# Patient Record
Sex: Male | Born: 1935 | Race: White | Hispanic: No | Marital: Married | Smoking: Never smoker
Health system: Southern US, Community
[De-identification: ages and names within clinical notes are randomized; demographics above are authoritative.]

## PROBLEM LIST (undated history)

## (undated) DIAGNOSIS — K5730 Diverticulosis of large intestine without perforation or abscess without bleeding: Secondary | ICD-10-CM

## (undated) DIAGNOSIS — F411 Generalized anxiety disorder: Secondary | ICD-10-CM

## (undated) DIAGNOSIS — C459 Mesothelioma, unspecified: Secondary | ICD-10-CM

## (undated) DIAGNOSIS — K6289 Other specified diseases of anus and rectum: Secondary | ICD-10-CM

## (undated) DIAGNOSIS — I10 Essential (primary) hypertension: Secondary | ICD-10-CM

## (undated) DIAGNOSIS — I2699 Other pulmonary embolism without acute cor pulmonale: Secondary | ICD-10-CM

## (undated) DIAGNOSIS — R972 Elevated prostate specific antigen [PSA]: Secondary | ICD-10-CM

## (undated) DIAGNOSIS — Z77090 Contact with and (suspected) exposure to asbestos: Secondary | ICD-10-CM

## (undated) DIAGNOSIS — E785 Hyperlipidemia, unspecified: Secondary | ICD-10-CM

## (undated) DIAGNOSIS — C801 Malignant (primary) neoplasm, unspecified: Secondary | ICD-10-CM

## (undated) DIAGNOSIS — K219 Gastro-esophageal reflux disease without esophagitis: Secondary | ICD-10-CM

## (undated) DIAGNOSIS — M1990 Unspecified osteoarthritis, unspecified site: Secondary | ICD-10-CM

## (undated) DIAGNOSIS — R0602 Shortness of breath: Secondary | ICD-10-CM

## (undated) HISTORY — DX: Contact with and (suspected) exposure to asbestos: Z77.090

## (undated) HISTORY — DX: Diverticulosis of large intestine without perforation or abscess without bleeding: K57.30

## (undated) HISTORY — DX: Essential (primary) hypertension: I10

## (undated) HISTORY — DX: Elevated prostate specific antigen (PSA): R97.20

## (undated) HISTORY — PX: COLONOSCOPY W/ POLYPECTOMY: SHX1380

## (undated) HISTORY — DX: Other specified diseases of anus and rectum: K62.89

## (undated) HISTORY — DX: Gastro-esophageal reflux disease without esophagitis: K21.9

## (undated) HISTORY — DX: Generalized anxiety disorder: F41.1

## (undated) HISTORY — DX: Hyperlipidemia, unspecified: E78.5

## (undated) HISTORY — DX: Unspecified osteoarthritis, unspecified site: M19.90

## (undated) HISTORY — DX: Other pulmonary embolism without acute cor pulmonale: I26.99

---

## 1987-05-03 HISTORY — PX: INGUINAL HERNIA REPAIR: SUR1180

## 1999-10-11 ENCOUNTER — Ambulatory Visit (HOSPITAL_BASED_OUTPATIENT_CLINIC_OR_DEPARTMENT_OTHER): Admission: RE | Admit: 1999-10-11 | Discharge: 1999-10-11 | Payer: Self-pay | Admitting: Urology

## 1999-10-16 ENCOUNTER — Emergency Department (HOSPITAL_COMMUNITY): Admission: EM | Admit: 1999-10-16 | Discharge: 1999-10-16 | Payer: Self-pay | Admitting: Emergency Medicine

## 2004-03-19 ENCOUNTER — Ambulatory Visit: Payer: Self-pay | Admitting: Pulmonary Disease

## 2004-06-16 ENCOUNTER — Ambulatory Visit: Payer: Self-pay | Admitting: Pulmonary Disease

## 2004-12-14 ENCOUNTER — Ambulatory Visit: Payer: Self-pay | Admitting: Pulmonary Disease

## 2005-06-20 ENCOUNTER — Ambulatory Visit: Payer: Self-pay | Admitting: Pulmonary Disease

## 2005-12-13 ENCOUNTER — Ambulatory Visit: Payer: Self-pay | Admitting: Pulmonary Disease

## 2006-04-13 ENCOUNTER — Ambulatory Visit: Payer: Self-pay | Admitting: Pulmonary Disease

## 2006-06-22 ENCOUNTER — Ambulatory Visit: Payer: Self-pay | Admitting: Pulmonary Disease

## 2006-06-22 LAB — CONVERTED CEMR LAB
ALT: 21 units/L (ref 0–40)
Alkaline Phosphatase: 50 units/L (ref 39–117)
BUN: 16 mg/dL (ref 6–23)
Bilirubin, Direct: 0.1 mg/dL (ref 0.0–0.3)
Calcium: 9.3 mg/dL (ref 8.4–10.5)
Cholesterol: 160 mg/dL (ref 0–200)
Eosinophils Absolute: 0.2 10*3/uL (ref 0.0–0.6)
Eosinophils Relative: 2.8 % (ref 0.0–5.0)
GFR calc Af Amer: 85 mL/min
GFR calc non Af Amer: 70 mL/min
HCT: 47.7 % (ref 39.0–52.0)
HDL: 37 mg/dL — ABNORMAL LOW (ref >39.0)
Hemoglobin, Urine: NEGATIVE
Hemoglobin: 16.3 g/dL (ref 13.0–17.0)
MCHC: 34.2 g/dL (ref 30.0–36.0)
MCV: 91.1 fL (ref 78.0–100.0)
Neutro Abs: 2.8 10*3/uL (ref 1.4–7.7)
Neutrophils Relative %: 46 % (ref 43.0–77.0)
Nitrite: NEGATIVE
RDW: 13.4 % (ref 11.5–14.6)
Specific Gravity, Urine: 1.01 (ref 1.000–1.03)
TSH: 2.59 microintl units/mL (ref 0.35–5.50)
Total CHOL/HDL Ratio: 4.3
Total Protein, Urine: NEGATIVE mg/dL
Urobilinogen, UA: 0.2 (ref 0.0–1.0)
pH: 6 (ref 5.0–8.0)

## 2006-12-13 ENCOUNTER — Ambulatory Visit: Payer: Self-pay | Admitting: Pulmonary Disease

## 2006-12-13 LAB — CONVERTED CEMR LAB
CO2: 33 meq/L — ABNORMAL HIGH (ref 19–32)
Chloride: 109 meq/L (ref 96–112)
Cholesterol: 143 mg/dL (ref 0–200)
GFR calc Af Amer: 107 mL/min
Glucose, Bld: 108 mg/dL — ABNORMAL HIGH (ref 70–99)
HDL: 31.8 mg/dL — ABNORMAL LOW (ref >39.0)
Sodium: 145 meq/L (ref 135–145)
Total Bilirubin: 0.8 mg/dL (ref 0.3–1.2)
Total CHOL/HDL Ratio: 4.5
Total Protein: 6.7 g/dL (ref 6.0–8.3)
Triglycerides: 147 mg/dL (ref 0–149)

## 2007-05-21 ENCOUNTER — Ambulatory Visit: Payer: Self-pay | Admitting: Pulmonary Disease

## 2007-06-04 DIAGNOSIS — I10 Essential (primary) hypertension: Secondary | ICD-10-CM | POA: Insufficient documentation

## 2007-06-04 DIAGNOSIS — K219 Gastro-esophageal reflux disease without esophagitis: Secondary | ICD-10-CM | POA: Insufficient documentation

## 2007-06-04 DIAGNOSIS — E785 Hyperlipidemia, unspecified: Secondary | ICD-10-CM | POA: Insufficient documentation

## 2007-06-18 ENCOUNTER — Telehealth: Payer: Self-pay | Admitting: Pulmonary Disease

## 2007-06-19 ENCOUNTER — Ambulatory Visit: Payer: Self-pay | Admitting: Pulmonary Disease

## 2007-06-19 LAB — CONVERTED CEMR LAB
ALT: 26 units/L (ref 0–53)
AST: 24 units/L (ref 0–37)
Bacteria, UA: NEGATIVE
Basophils Absolute: 0 10*3/uL (ref 0.0–0.1)
Bilirubin Urine: NEGATIVE
Bilirubin, Direct: 0.1 mg/dL (ref 0.0–0.3)
Cholesterol: 165 mg/dL (ref 0–200)
Crystals: NEGATIVE
Eosinophils Relative: 5.4 % — ABNORMAL HIGH (ref 0.0–5.0)
GFR calc Af Amer: 77 mL/min
Glucose, Bld: 109 mg/dL — ABNORMAL HIGH (ref 70–99)
HDL: 30.9 mg/dL — ABNORMAL LOW (ref >39.0)
Hemoglobin, Urine: NEGATIVE
Hemoglobin: 15.9 g/dL (ref 13.0–17.0)
MCHC: 33 g/dL (ref 30.0–36.0)
Monocytes Absolute: 0.5 10*3/uL (ref 0.2–0.7)
Monocytes Relative: 7.3 % (ref 3.0–11.0)
Mucus, UA: NEGATIVE
Neutro Abs: 3 10*3/uL (ref 1.4–7.7)
Nitrite: NEGATIVE
Platelets: 164 10*3/uL (ref 150–400)
Sodium: 141 meq/L (ref 135–145)
Squamous Epithelial / LPF: NEGATIVE /lpf
TSH: 5.57 microintl units/mL — ABNORMAL HIGH (ref 0.35–5.50)
Total Bilirubin: 0.6 mg/dL (ref 0.3–1.2)
Total CHOL/HDL Ratio: 5.3
Triglycerides: 190 mg/dL — ABNORMAL HIGH (ref 0–149)
Urine Glucose: NEGATIVE mg/dL
Urobilinogen, UA: 0.2 (ref 0.0–1.0)
WBC: 6.7 10*3/uL (ref 4.5–10.5)
pH: 6.5 (ref 5.0–8.0)

## 2007-06-26 ENCOUNTER — Ambulatory Visit: Payer: Self-pay | Admitting: Pulmonary Disease

## 2007-06-26 DIAGNOSIS — F411 Generalized anxiety disorder: Secondary | ICD-10-CM | POA: Insufficient documentation

## 2007-06-26 DIAGNOSIS — Z77090 Contact with and (suspected) exposure to asbestos: Secondary | ICD-10-CM

## 2007-06-26 DIAGNOSIS — M1990 Unspecified osteoarthritis, unspecified site: Secondary | ICD-10-CM | POA: Insufficient documentation

## 2007-06-26 DIAGNOSIS — K5730 Diverticulosis of large intestine without perforation or abscess without bleeding: Secondary | ICD-10-CM | POA: Insufficient documentation

## 2007-06-26 DIAGNOSIS — R972 Elevated prostate specific antigen [PSA]: Secondary | ICD-10-CM

## 2007-07-26 ENCOUNTER — Encounter: Payer: Self-pay | Admitting: Pulmonary Disease

## 2007-08-09 ENCOUNTER — Encounter: Payer: Self-pay | Admitting: Pulmonary Disease

## 2007-09-03 ENCOUNTER — Telehealth (INDEPENDENT_AMBULATORY_CARE_PROVIDER_SITE_OTHER): Payer: Self-pay | Admitting: *Deleted

## 2007-09-04 ENCOUNTER — Ambulatory Visit: Payer: Self-pay | Admitting: Pulmonary Disease

## 2007-09-05 DIAGNOSIS — R109 Unspecified abdominal pain: Secondary | ICD-10-CM | POA: Insufficient documentation

## 2007-09-21 ENCOUNTER — Ambulatory Visit: Payer: Self-pay | Admitting: Gastroenterology

## 2007-10-09 ENCOUNTER — Telehealth: Payer: Self-pay | Admitting: Gastroenterology

## 2007-10-10 ENCOUNTER — Ambulatory Visit: Payer: Self-pay | Admitting: Gastroenterology

## 2007-10-11 ENCOUNTER — Telehealth: Payer: Self-pay | Admitting: Gastroenterology

## 2007-10-11 DIAGNOSIS — R198 Other specified symptoms and signs involving the digestive system and abdomen: Secondary | ICD-10-CM

## 2007-10-25 ENCOUNTER — Encounter: Payer: Self-pay | Admitting: Pulmonary Disease

## 2007-10-31 ENCOUNTER — Ambulatory Visit: Payer: Self-pay | Admitting: Gastroenterology

## 2007-11-15 ENCOUNTER — Ambulatory Visit: Payer: Self-pay | Admitting: Family Medicine

## 2007-11-15 DIAGNOSIS — L821 Other seborrheic keratosis: Secondary | ICD-10-CM | POA: Insufficient documentation

## 2007-12-04 ENCOUNTER — Telehealth (INDEPENDENT_AMBULATORY_CARE_PROVIDER_SITE_OTHER): Payer: Self-pay | Admitting: *Deleted

## 2008-06-30 ENCOUNTER — Ambulatory Visit: Payer: Self-pay | Admitting: Family Medicine

## 2008-07-02 LAB — CONVERTED CEMR LAB
AST: 22 units/L (ref 0–37)
BUN: 16 mg/dL (ref 6–23)
Basophils Absolute: 0 10*3/uL (ref 0.0–0.1)
Basophils Relative: 0.1 % (ref 0.0–3.0)
CO2: 30 meq/L (ref 19–32)
Chloride: 104 meq/L (ref 96–112)
Eosinophils Absolute: 0.2 10*3/uL (ref 0.0–0.7)
HCT: 46.9 % (ref 39.0–52.0)
Lymphocytes Relative: 40.5 % (ref 12.0–46.0)
MCHC: 34.7 g/dL (ref 30.0–36.0)
MCV: 93.4 fL (ref 78.0–100.0)
Neutrophils Relative %: 49.3 % (ref 43.0–77.0)
RBC: 5.02 M/uL (ref 4.22–5.81)
RDW: 13.4 % (ref 11.5–14.6)
Total Bilirubin: 0.8 mg/dL (ref 0.3–1.2)
Triglycerides: 180 mg/dL — ABNORMAL HIGH (ref 0–149)
VLDL: 36 mg/dL (ref 0–40)

## 2008-08-11 ENCOUNTER — Telehealth: Payer: Self-pay | Admitting: Family Medicine

## 2009-05-05 ENCOUNTER — Encounter (INDEPENDENT_AMBULATORY_CARE_PROVIDER_SITE_OTHER): Payer: Self-pay | Admitting: *Deleted

## 2009-05-19 ENCOUNTER — Ambulatory Visit: Payer: Self-pay | Admitting: Gastroenterology

## 2009-05-19 DIAGNOSIS — K6289 Other specified diseases of anus and rectum: Secondary | ICD-10-CM

## 2009-08-03 ENCOUNTER — Ambulatory Visit: Payer: Self-pay | Admitting: Family Medicine

## 2009-08-03 ENCOUNTER — Telehealth (INDEPENDENT_AMBULATORY_CARE_PROVIDER_SITE_OTHER): Payer: Self-pay | Admitting: *Deleted

## 2009-08-03 DIAGNOSIS — G609 Hereditary and idiopathic neuropathy, unspecified: Secondary | ICD-10-CM

## 2009-08-10 ENCOUNTER — Ambulatory Visit: Payer: Self-pay | Admitting: Family Medicine

## 2009-08-11 DIAGNOSIS — L820 Inflamed seborrheic keratosis: Secondary | ICD-10-CM

## 2009-09-11 ENCOUNTER — Ambulatory Visit: Payer: Self-pay | Admitting: Family Medicine

## 2009-09-11 LAB — CONVERTED CEMR LAB
Bilirubin Urine: NEGATIVE
Glucose, Urine, Semiquant: NEGATIVE
Urobilinogen, UA: 0.2
pH: 6

## 2010-05-30 LAB — CONVERTED CEMR LAB
AST: 24 units/L (ref 0–37)
Alkaline Phosphatase: 51 units/L (ref 39–117)
Basophils Absolute: 0 10*3/uL (ref 0.0–0.1)
Basophils Relative: 0.3 % (ref 0.0–3.0)
Chloride: 105 meq/L (ref 96–112)
Cholesterol: 173 mg/dL (ref 0–200)
Direct LDL: 93.3 mg/dL
GFR calc non Af Amer: 69.67 mL/min (ref >60)
Glucose, Bld: 95 mg/dL (ref 70–99)
Hemoglobin: 17 g/dL (ref 13.0–17.0)
Iron: 77 ug/dL (ref 42–165)
MCHC: 34.8 g/dL (ref 30.0–36.0)
MCV: 94.3 fL (ref 78.0–100.0)
PSA: 14.37 ng/mL — ABNORMAL HIGH (ref 0.10–4.00)
Saturation Ratios: 23.5 % (ref 20.0–50.0)
Total Bilirubin: 0.5 mg/dL (ref 0.3–1.2)
Total CHOL/HDL Ratio: 4
Triglycerides: 205 mg/dL — ABNORMAL HIGH (ref 0.0–149.0)
VLDL: 41 mg/dL — ABNORMAL HIGH (ref 0.0–40.0)
Vitamin B-12: 369 pg/mL (ref 211–911)
WBC: 6.4 10*3/uL (ref 4.5–10.5)

## 2010-06-03 NOTE — Assessment & Plan Note (Signed)
Summary: RECTAL SORENESS...EM   History of Present Illness Visit Type: Follow-up Visit Primary GI MD: Sheryn Bison MD FACP FAGA Primary Provider: Fredia Sorrow, MD Chief Complaint: rectal soreness after bowel movements History of Present Illness:   This patient is a 77 year old white male he had an removal of a hyperplastic hypertrophied anal papilla at [facility] approximately a year ago. Colonoscopy otherwise that time was unremarkable. About the same time he had prostate biopsy by Dr. Shiela Mayer and has subsequently had persistent discomfort around his anal rectal area. He denies bowel regularity and takes daily Citrucel. He's had no rectal bleeding or abdominal pain. Local steroid creams to his rectum have not been successful in alleviating his discomfort which is worse with a bowel movement. He does have chronic diverticulosis, hypertension, anxiety syndrome, and BPH.   GI Review of Systems    Reports acid reflux and  bloating.      Denies abdominal pain, belching, chest pain, dysphagia with liquids, dysphagia with solids, heartburn, loss of appetite, nausea, vomiting, vomiting blood, weight loss, and  weight gain.      Reports diverticulosis, hemorrhoids, and  rectal pain.     Denies anal fissure, black tarry stools, change in bowel habit, constipation, diarrhea, fecal incontinence, heme positive stool, irritable bowel syndrome, jaundice, light color stool, liver problems, and  rectal bleeding.    Current Medications (verified): 1)  Tenormin 50 Mg Tabs (Atenolol) .... Take 1 Tablet By Mouth Once A Day 2)  Terazosin Hcl 10 Mg Caps (Terazosin Hcl) .... Once Daily 3)  Pravachol 40 Mg Tabs (Pravastatin Sodium) .... Take 1 Tablet By Mouth At Bedtime 4)  Nexium 40 Mg Cpdr (Esomeprazole Magnesium) .... Take 1 Capsule By Mouth Once A Day 5)  Multivitamins   Tabs (Multiple Vitamin) .Marland Kitchen.. 1 Tab Daily.Marland KitchenMarland Kitchen 6)  Epipen 0.3 Mg/0.67ml (1:1000)  Devi (Epinephrine Hcl (Anaphylaxis)) .... Use As  Directed 7)  Fish Oil   Oil (Fish Oil) .... 2 Tabs Once Daily 8)  Glucosamine-Chondroitin 250-200 Mg  Caps (Glucosamine-Chondroitin) .... Once Daily 9)  Adult Aspirin Low Strength 81 Mg  Tbdp (Aspirin) .... Once Daily  Allergies (verified): 1)  ! * Bee Stings  Past History:  Past medical, surgical, family and social histories (including risk factors) reviewed for relevance to current acute and chronic problems.  Past Medical History: Reviewed history from 09/04/2007 and no changes required. HISTORY OF ASBESTOS EXPOSURE (ICD-V15.84) HYPERTENSION (ICD-401.9) HYPERLIPIDEMIA (ICD-272.4) GERD (ICD-530.81) DIVERTICULOSIS OF COLON (ICD-562.10) ELEVATED PROSTATE SPECIFIC ANTIGEN (ICD-790.93) DEGENERATIVE JOINT DISEASE (ICD-715.90) ANXIETY (ICD-300.00)    Past Surgical History: Reviewed history from 06/26/2007 and no changes required. S/P left inguinal hernia repair  Family History: Reviewed history from 11/15/2007 and no changes required.  father died in his 12s of coronary disease and underlying colon cancer mother died of coronary diseaseone brother had  a heart attack two in good health.  One sister in good health  Social History: Reviewed history from 11/15/2007 and no changes required. Retired Never Smoked Alcohol use-no Drug use-no Regular exercise-yes  Review of Systems  The patient denies allergy/sinus, anemia, anxiety-new, arthritis/joint pain, back pain, blood in urine, breast changes/lumps, change in vision, confusion, cough, coughing up blood, depression-new, fainting, fatigue, fever, headaches-new, hearing problems, heart murmur, heart rhythm changes, itching, menstrual pain, muscle pains/cramps, night sweats, nosebleeds, pregnancy symptoms, shortness of breath, skin rash, sleeping problems, sore throat, swelling of feet/legs, swollen lymph glands, thirst - excessive , urination - excessive , urination changes/pain, urine leakage, vision changes, and voice change.  Vital Signs:  Patient profile:   77 year old male Height:      68 inches Weight:      172.13 pounds BMI:     26.27 Pulse rate:   64 / minute Pulse rhythm:   regular BP sitting:   140 / 76  (left arm) Cuff size:   regular  Vitals Entered By: June McMurray CMA Duncan Dull) (May 19, 2009 2:36 PM)  Physical Exam  General:  Well developed, well nourished, no acute distress.healthy appearing.   Head:  Normocephalic and atraumatic. Eyes:  PERRLA, no icterus.exam deferred to patient's ophthalmologist.   Abdomen:  Soft, nontender and nondistended. No masses, hepatosplenomegaly or hernias noted. Normal bowel sounds. Rectal:  inspection of his rectum shows slight anal irritation laterally on the left side without a definite fissure or palpable skin tag.I cannot see any hemorrhoids or fistula. Rectal exam otherwise is unremarkable stool is guaiac-negative. Prostate:  Prostate is diffusely enlarged and slightly nodular. Psych:  Alert and cooperative. Normal mood and affect.   Impression & Recommendations:  Problem # 1:  ANAL OR RECTAL PAIN (ZHY-865.78) Assessment Unchanged His rectal exam is really fairly unremarkable. I think he has some local atrophy of his perianal area related to his surgery and use of steroid creams. He does have a history of seborrhea and this may be a localized form of rectal seborrheic irritation. There is no evidence of recurrent polyps,fissures or fistulae. This patient has a history of a very low pain threshold and chronic IBS-type complaints. Have asked him to stop local steroid creams and to use Balneol cleansing twice a day to his rectum followed by zinc oxide paste and to continue daily fiber supplement.  Problem # 2:  ELEVATED PROSTATE SPECIFIC ANTIGEN (ICD-790.93) Assessment: Improved continue high-fiber diet as tolerated  Problem # 3:  ELEVATED PROSTATE SPECIFIC ANTIGEN (ICD-790.93) Assessment: Unchanged Followup with urology as scheduled  Patient  Instructions: 1)  Copy sent to : Dr. Kelle Darting 2)  Please continue current medications. 3)  stop steroid creams and use zinc oxide paste and rectal cleansing solution b.i.d. 4)  Continue high-fiber diet and fiber supplements   Appended Document: RECTAL SORENESS.Marland KitchenMarland KitchenEM    Clinical Lists Changes  Medications: Added new medication of * BALANOL SOLUTION Use BID

## 2010-06-03 NOTE — Assessment & Plan Note (Signed)
Summary: mole removal/treatment room/cjr   Procedure Note  Mole Biopsy/Removal: Indication: suspicious lesion Consent signed: yes  Procedure # 1: elliptical incision with 2 mm margin    Size (in cm): 1.2 x 1.2    Region: dorsal    Location: shoulder R    Instrument used: #15 blade    Anesthesia: 1% lidocaine w/epinephrine    Closure: cautery  Cleaned and prepped with: alcohol Wound dressing: neosporin and bandaid   Primary Care Provider:  Fredia Sorrow, MD   History of Present Illness: Sean Mitchell is a 77 year old, married male, nonsmoker, who comes in today for removal of lesion under his right shoulder that is red and inflamed.  Allergies: 1)  ! * Bee Stings   Complete Medication List: 1)  Tenormin 50 Mg Tabs (Atenolol) .... Take 1 tablet by mouth once a day 2)  Terazosin Hcl 10 Mg Caps (Terazosin hcl) .... Once daily 3)  Pravachol 40 Mg Tabs (Pravastatin sodium) .... Take 1 tablet by mouth at bedtime 4)  Nexium 40 Mg Cpdr (Esomeprazole magnesium) .... Take 1 capsule by mouth once a day 5)  Multivitamins Tabs (Multiple vitamin) .Marland Kitchen.. 1 tab daily.Marland KitchenMarland Kitchen 6)  Epipen 0.3 Mg/0.17ml (1:1000) Devi (Epinephrine hcl (anaphylaxis)) .... Use as directed 7)  Fish Oil Oil (Fish oil) .... 2 tabs once daily 8)  Glucosamine-chondroitin 250-200 Mg Caps (Glucosamine-chondroitin) .... Once daily 9)  Adult Aspirin Low Strength 81 Mg Tbdp (Aspirin) .... Once daily 10)  Balanol Solution  .... Use bid  Other Orders: Excise other (benign) lesion (FEENLM), 0.6 - 1.0 cm (11441)

## 2010-06-03 NOTE — Miscellaneous (Signed)
Summary: Consent for Mole Removal  Consent for Mole Removal   Imported By: Maryln Gottron 08/13/2009 10:07:05  _____________________________________________________________________  External Attachment:    Type:   Image     Comment:   External Document

## 2010-06-03 NOTE — Progress Notes (Signed)
  Faxed lab results from 08-03-09 to Dr. Vonita Moss at fax# [phone].

## 2010-06-03 NOTE — Assessment & Plan Note (Signed)
Summary: emp/pt coming in fasting/cjr   Vital Signs:  Patient profile:   77 year old male Height:      68 inches Weight:      171 pounds Temp:     97.6 degrees F oral BP sitting:   124 / 80  (left arm) Cuff size:   regular  Vitals Entered By: Kern Reap CMA Duncan Dull) (August 03, 2009 9:34 AM) CC: cpx Is Patient Diabetic? No Pain Assessment Patient in pain? no        Primary Care Provider:  Fredia Sorrow, MD  CC:  cpx.  History of Present Illness: Sean Mitchell is a 77 year old, married male, nonsmoker, who comes in today for evaluation of reflux esophagitis hyperlipidemia, mild hypertension, and history of bee sting allergy and BPH.  His reflux esophagitis is treated with Nexium 40 mg daily asymptomatic on medication.  Hyperlipidemia.  History with Pravachol 40 mg nightly.  We will check lipid panel today.  His hypertension was treated with Tenormin 50 mg daily.  BP 124/80.  We always given the new EpiPen because of his history of bee sting allergy.  He also takes terazoin 10 mg daily for BPH.  He's had elevated prostate increased PSA, but negative biopsies.  He also has a new problem.  He complains of some tingling in his feet.  Review of systems negative.  He gets routine eye care.  Dental care.  Colonoscopy 2008 normal, tetanus booster 2005, Pneumovax 2010, seasonal flu 2010, shingles 2009  his past medical history, social history, family history reviewed in detail the been no significant changes.  Except for the neuropathy as noted above.  He continues to be physically active.  His weight is good at 171.  Mood is good.  Hearing normal ADLs, normal Ris fall.  Minimal home safety reviewed negative.  Height, weight, vision fine.  Allergies: 1)  ! * Bee Stings  Past History:  Past medical, surgical, family and social histories (including risk factors) reviewed, and no changes noted (except as noted below).  Past Medical History: Reviewed history from 09/04/2007 and no  changes required. HISTORY OF ASBESTOS EXPOSURE (ICD-V15.84) HYPERTENSION (ICD-401.9) HYPERLIPIDEMIA (ICD-272.4) GERD (ICD-530.81) DIVERTICULOSIS OF COLON (ICD-562.10) ELEVATED PROSTATE SPECIFIC ANTIGEN (ICD-790.93) DEGENERATIVE JOINT DISEASE (ICD-715.90) ANXIETY (ICD-300.00)    Past Surgical History: Reviewed history from 06/26/2007 and no changes required. S/P left inguinal hernia repair  Family History: Reviewed history from 11/15/2007 and no changes required.  father died in his 79s of coronary disease and underlying colon cancer mother died of coronary diseaseone brother had  a heart attack two in good health.  One sister in good health  Social History: Reviewed history from 11/15/2007 and no changes required. Retired Never Smoked Alcohol use-no Drug use-no Regular exercise-yes  Review of Systems      See HPI  Physical Exam  General:  Well-developed,well-nourished,in no acute distress; alert,appropriate and cooperative throughout examination Head:  Normocephalic and atraumatic without obvious abnormalities. No apparent alopecia or balding. Eyes:  No corneal or conjunctival inflammation noted. EOMI. Perrla. Funduscopic exam benign, without hemorrhages, exudates or papilledema. Vision grossly normal. Ears:  External ear exam shows no significant lesions or deformities.  Otoscopic examination reveals clear canals, tympanic membranes are intact bilaterally without bulging, retraction, inflammation or discharge. Hearing is grossly normal bilaterally. Nose:  External nasal examination shows no deformity or inflammation. Nasal mucosa are pink and moist without lesions or exudates. Mouth:  Oral mucosa and oropharynx without lesions or exudates.  Teeth in good repair. Neck:  No deformities, masses, or tenderness noted. Chest Wall:  No deformities, masses, tenderness or gynecomastia noted. Breasts:  No masses or gynecomastia noted Lungs:  Normal respiratory effort, chest expands  symmetrically. Lungs are clear to auscultation, no crackles or wheezes. Heart:  Normal rate and regular rhythm. S1 and S2 normal without gallop, murmur, click, rub or other extra sounds. Abdomen:  Bowel sounds positive,abdomen soft and non-tender without masses, organomegaly or hernias noted. Msk:  No deformity or scoliosis noted of thoracic or lumbar spine.   Pulses:  R and L carotid,radial,femoral,dorsalis pedis and posterior tibial pulses are full and equal bilaterally Extremities:  No clubbing, cyanosis, edema, or deformity noted with normal full range of motion of all joints.   Neurologic:  No cranial nerve deficits noted. Station and gait are normal. Plantar reflexes are down-going bilaterally. DTRs are symmetrical throughout. Sensory, motor and coordinative functions appear intact. Skin:  Intact without suspicious lesions or rashes Cervical Nodes:  No lymphadenopathy noted Axillary Nodes:  No palpable lymphadenopathy Inguinal Nodes:  No significant adenopathy Psych:  Cognition and judgment appear intact. Alert and cooperative with normal attention span and concentration. No apparent delusions, illusions, hallucinations   Impression & Recommendations:  Problem # 1:  HISTORY OF ASBESTOS EXPOSURE (ICD-V15.84) Assessment Unchanged  Problem # 2:  HYPERTENSION (ICD-401.9) Assessment: Improved  His updated medication list for this problem includes:    Tenormin 50 Mg Tabs (Atenolol) .Marland Kitchen... Take 1 tablet by mouth once a day    Terazosin Hcl 10 Mg Caps (Terazosin hcl) ..... Once daily  Orders: Venipuncture (95621) TLB-Lipid Panel (80061-LIPID) TLB-CBC Platelet - w/Differential (85025-CBCD) TLB-Hepatic/Liver Function Pnl (80076-HEPATIC) TLB-TSH (Thyroid Stimulating Hormone) (84443-TSH) TLB-BMP (Basic Metabolic Panel-BMET) (80048-METABOL) TLB-B12 + Folate Pnl (30865_78469-G29/BMW) TLB-IBC Pnl (Iron/FE;Transferrin) (83550-IBC) TLB-PSA (Prostate Specific Antigen) (84153-PSA) TLB-A1C / Hgb  A1C (Glycohemoglobin) (83036-A1C) Prescription Created Electronically [phone]) EKG w/ Interpretation (93000)  Problem # 3:  GERD (ICD-530.81) Assessment: Improved  His updated medication list for this problem includes:    Nexium 40 Mg Cpdr (Esomeprazole magnesium) .Marland Kitchen... Take 1 capsule by mouth once a day  Orders: Venipuncture (40102) TLB-Lipid Panel (80061-LIPID) TLB-CBC Platelet - w/Differential (85025-CBCD) TLB-Hepatic/Liver Function Pnl (80076-HEPATIC) TLB-TSH (Thyroid Stimulating Hormone) (84443-TSH) TLB-BMP (Basic Metabolic Panel-BMET) (80048-METABOL) TLB-B12 + Folate Pnl ([id number]-K74/QVZ) TLB-IBC Pnl (Iron/FE;Transferrin) (83550-IBC) TLB-PSA (Prostate Specific Antigen) (84153-PSA) TLB-A1C / Hgb A1C (Glycohemoglobin) (83036-A1C) Prescription Created Electronically [phone])  Problem # 4:  ELEVATED PROSTATE SPECIFIC ANTIGEN (ICD-790.93) Assessment: Unchanged  Problem # 5:  PERIPHERAL NEUROPATHY (ICD-356.9) Assessment: New  Orders: Venipuncture (56433) TLB-Lipid Panel (80061-LIPID) TLB-CBC Platelet - w/Differential (85025-CBCD) TLB-Hepatic/Liver Function Pnl (80076-HEPATIC) TLB-TSH (Thyroid Stimulating Hormone) (84443-TSH) TLB-BMP (Basic Metabolic Panel-BMET) (80048-METABOL) TLB-B12 + Folate Pnl ([id number]-A63/KZS) TLB-IBC Pnl (Iron/FE;Transferrin) (83550-IBC) TLB-PSA (Prostate Specific Antigen) (84153-PSA) TLB-A1C / Hgb A1C (Glycohemoglobin) (83036-A1C) Prescription Created Electronically [phone])  Complete Medication List: 1)  Tenormin 50 Mg Tabs (Atenolol) .... Take 1 tablet by mouth once a day 2)  Terazosin Hcl 10 Mg Caps (Terazosin hcl) .... Once daily 3)  Pravachol 40 Mg Tabs (Pravastatin sodium) .... Take 1 tablet by mouth at bedtime 4)  Nexium 40 Mg Cpdr (Esomeprazole magnesium) .... Take 1 capsule by mouth once a day 5)  Multivitamins Tabs (Multiple vitamin) .Marland Kitchen.. 1 tab daily.Marland KitchenMarland Kitchen 6)  Epipen 0.3 Mg/0.59ml (1:1000) Devi (Epinephrine hcl (anaphylaxis)) .... Use  as directed 7)  Fish Oil Oil (Fish oil) .... 2 tabs once daily 8)  Glucosamine-chondroitin 250-200 Mg Caps (Glucosamine-chondroitin) .... Once daily 9)  Adult Aspirin Low Strength 81 Mg Tbdp (Aspirin) .Marland KitchenMarland KitchenMarland Kitchen  Once daily 10)  Balanol Solution  .... Use bid  Other Orders: T-2 View CXR (71020TC)  Patient Instructions: 1)  continue current medication.  We will call you with the report of your lab work.  You might want to consider taking the Nexium, Monday, Wednesday, Friday, instead of daily.  You can also take the Pravachol, Monday, Wednesday, Friday instead of daily also. 2)  Please schedule a follow-up appointment in 1 year. 3)  It is important that you exercise regularly at least 20 minutes 5 times a week. If you develop chest pain, have severe difficulty breathing, or feel very tired , stop exercising immediately and seek medical attention. 4)  Schedule a colonoscopy/sigmoidoscopy to help detect colon cancer. 5)  Take an Aspirin every day. Prescriptions: EPIPEN 0.3 MG/0.3ML (1:1000)  DEVI (EPINEPHRINE HCL (ANAPHYLAXIS)) use as directed  #1 x 1   Entered and Authorized by:   Roderick Pee MD   Signed by:   Roderick Pee MD on 08/03/2009   Method used:   Electronically to        [facility]  [phone]* (retail)       [address]       [place], [place]  [address]       Ph: [phone] or [phone]       Fax: [phone]   RxID:   [id number] NEXIUM 40 MG CPDR (ESOMEPRAZOLE MAGNESIUM) Take 1 capsule by mouth once a day  #100 x 3   Entered and Authorized by:   Roderick Pee MD   Signed by:   Roderick Pee MD on 08/03/2009   Method used:   Electronically to        [facility]  [phone]* (retail)       [address]       [place], [place]  [address]       Ph: [phone] or [phone]       Fax: [phone]   RxID:   [id number] PRAVACHOL 40 MG TABS (PRAVASTATIN SODIUM) Take 1 tablet by mouth at  bedtime  #100 x 3   Entered and Authorized by:   Roderick Pee MD   Signed by:   Roderick Pee MD on 08/03/2009   Method used:   Electronically to        [facility]  [phone]* (retail)       [address]       [place], [place]  [address]       Ph: [phone] or [phone]       Fax: [phone]   RxID:   [id number] TERAZOSIN HCL 10 MG CAPS (TERAZOSIN HCL) once daily  #100 x 3   Entered and Authorized by:   Roderick Pee MD   Signed by:   Roderick Pee MD on 08/03/2009   Method used:   Electronically to        [facility]  [phone]* (retail)       [address]       [place], [place]  [address]       Ph: [phone] or [phone]       Fax: [phone]   RxID:   [id number] TENORMIN 50 MG TABS (ATENOLOL) Take 1 tablet by mouth once a day  #100  x 3   Entered and Authorized by:   Roderick Pee MD   Signed by:   Roderick Pee MD on 08/03/2009   Method used:   Electronically to        [facility]  [phone]* (retail)       [address]       [place], [place]  [address]       Ph: [phone] or [phone]       Fax: [phone]   RxID:   [id number]    Immunization History:  Influenza Immunization History:    Influenza:  historical (01/30/2009)

## 2010-06-03 NOTE — Letter (Signed)
Summary: New Patient letter  [facility]  [address] [place], [place] [address]   Phone: [phone]  Fax: [phone]       05/05/2009 MRN: [id number]  Sean Mitchell [address] [place], [place]  [address]  Dear Sean Mitchell,  Welcome to the Gastroenterology Division at [facility].    You are scheduled to see Dr. Sheryn Bison on May 19, 2009 at 2:45pm on the 3rd floor at [facility], [address]  We ask that you try to arrive at our office 15 minutes prior to your appointment time to allow for check-in.  We would like you to complete the enclosed self-administered evaluation form prior to your visit and bring it with you on the day of your appointment.  We will review it with you.  Also, please bring a complete list of all your medications or, if you prefer, bring the medication bottles and we will list them.  Please bring your insurance card so that we may make a copy of it.  If your insurance requires a referral to see a specialist, please bring your referral form from your primary care physician.  Co-payments are due at the time of your visit and may be paid by cash, check or credit card.     Your office visit will consist of a consult with your physician (includes a physical exam), any laboratory testing he/she may order, scheduling of any necessary diagnostic testing (e.g. x-ray, ultrasound, CT-scan), and scheduling of a procedure (e.g. Endoscopy, Colonoscopy) if required.  Please allow enough time on your schedule to allow for any/all of these possibilities.    If you cannot keep your appointment, please call [phone] to cancel or reschedule prior to your appointment date.  This allows [place] the opportunity to schedule an appointment for another patient in need of care.  If you do not cancel or reschedule by 5 p.m. the business day prior to your appointment date, you will be charged a $50.00 late cancellation/no-show fee.    Thank you  for choosing [facility] for your medical needs.  We appreciate the opportunity to care for you.  Please visit [place] at our website  to learn more about our practice.                     Sincerely,                                                             The Gastroenterology Division

## 2010-06-03 NOTE — Assessment & Plan Note (Signed)
Summary: L SIDE / FLANK / BACK PAIN // RS   Vital Signs:  Patient profile:   77 year old male Weight:      170 pounds Temp:     97.6 degrees F oral BP sitting:   130 / 80  (right arm) CC: L side/flank back pain   Primary Care Provider:  Martin Belling, MD  CC:  L side/flank back pain.  History of Present Illness: Sean Mitchell is a 77 year old male, who comes in today for evaluation of left lower back pain x 4 days.  His wife has been in the, hospital.  He's been sitting a lot, and 4 days ago, developed the gradual onset of left low back pain.  He describes the discomfort as a dull.  It comes and goes it's a 5 on a scale of one to 10.  If he stands and stretches.  The pain feels better.  Review of systems negative.  No history of trauma.  No bowel nor bladder dysfunction  Allergies: 1)  ! * Bee Stings  Past History:  Past medical, surgical, family and social histories (including risk factors) reviewed for relevance to current acute and chronic problems.  Past Medical History: Reviewed history from 09/04/2007 and no changes required. HISTORY OF ASBESTOS EXPOSURE (ICD-V15.84) HYPERTENSION (ICD-401.9) HYPERLIPIDEMIA (ICD-272.4) GERD (ICD-530.81) DIVERTICULOSIS OF COLON (ICD-562.10) ELEVATED PROSTATE SPECIFIC ANTIGEN (ICD-790.93) DEGENERATIVE JOINT DISEASE (ICD-715.90) ANXIETY (ICD-300.00)    Past Surgical History: Reviewed history from 06/26/2007 and no changes required. S/P left inguinal hernia repair  Family History: Reviewed history from 11/15/2007 and no changes required.  father died in his 57s of coronary disease and underlying colon cancer mother died of coronary diseaseone brother had  a heart attack two in good health.  One sister in good health  Social History: Reviewed history from 11/15/2007 and no changes required. Retired Never Smoked Alcohol use-no Drug use-no Regular exercise-yes  Review of Systems      See HPI  Physical Exam  General:   Well-developed,well-nourished,in no acute distress; alert,appropriate and cooperative throughout examination Msk:  No deformity or scoliosis noted of thoracic or lumbar spine.   Pulses:  R and L carotid,radial,femoral,dorsalis pedis and posterior tibial pulses are full and equal bilaterally Extremities:  No clubbing, cyanosis, edema, or deformity noted with normal full range of motion of all joints.   Neurologic:  No cranial nerve deficits noted. Station and gait are normal. Plantar reflexes are down-going bilaterally. DTRs are symmetrical throughout. Sensory, motor and coordinative functions appear intact.   Impression & Recommendations:  Problem # 1:  FLANK PAIN, LEFT (ICD-789.09) Assessment New  His updated medication list for this problem includes:    Adult Aspirin Low Strength 81 Mg Tbdp (Aspirin) ..... Once daily  Orders: UA Dipstick w/o Micro (manual) (16109)  Complete Medication List: 1)  Tenormin 50 Mg Tabs (Atenolol) .... Take 1 tablet by mouth once a day 2)  Terazosin Hcl 10 Mg Caps (Terazosin hcl) .... Once daily 3)  Pravachol 40 Mg Tabs (Pravastatin sodium) .... Take 1 tablet by mouth at bedtime 4)  Nexium 40 Mg Cpdr (Esomeprazole magnesium) .... Take 1 capsule by mouth once a day 5)  Multivitamins Tabs (Multiple vitamin) .Marland Kitchen.. 1 tab daily.Marland KitchenMarland Kitchen 6)  Epipen 0.3 Mg/0.10ml (1:1000) Devi (Epinephrine hcl (anaphylaxis)) .... Use as directed 7)  Fish Oil Oil (Fish oil) .... 2 tabs once daily 8)  Glucosamine-chondroitin 250-200 Mg Caps (Glucosamine-chondroitin) .... Once daily 9)  Adult Aspirin Low Strength 81 Mg Tbdp (Aspirin) .... Once daily  10)  Balanol Solution  .... Use bid  Patient Instructions: 1)  take 600 mg of Motrin twice a day with food.  Also at bedtime use a heating pad remember to use low heat and cover the heating pad with a towel so u do not burn y skin     Laboratory Results   Urine Tests  Date/Time Received: Sep 11, 2009   Routine Urinalysis   Color:  yellow Appearance: Clear Glucose: negative   (Normal Range: Negative) Bilirubin: negative   (Normal Range: Negative) Ketone: negative   (Normal Range: Negative) Spec. Gravity: <1.005   (Normal Range: 1.003-1.035) Blood: negative   (Normal Range: Negative) pH: 6.0   (Normal Range: 5.0-8.0) Protein: negative   (Normal Range: Negative) Urobilinogen: 0.2   (Normal Range: 0-1) Nitrite: negative   (Normal Range: Negative) Leukocyte Esterace: negative   (Normal Range: Negative)    Comments: Kern Reap CMA (AAMA)  Sep 11, 2009 11:18 AM

## 2010-08-20 ENCOUNTER — Other Ambulatory Visit: Payer: Self-pay | Admitting: Family Medicine

## 2010-09-02 ENCOUNTER — Ambulatory Visit: Payer: Self-pay | Admitting: Family Medicine

## 2010-09-06 ENCOUNTER — Encounter: Payer: Self-pay | Admitting: Family Medicine

## 2010-09-07 ENCOUNTER — Ambulatory Visit (INDEPENDENT_AMBULATORY_CARE_PROVIDER_SITE_OTHER): Payer: PRIVATE HEALTH INSURANCE | Admitting: Family Medicine

## 2010-09-07 ENCOUNTER — Ambulatory Visit
Admission: RE | Admit: 2010-09-07 | Discharge: 2010-09-07 | Disposition: A | Discharge status: Home / Self Care | Payer: PRIVATE HEALTH INSURANCE | Source: Ambulatory Visit | Attending: Family Medicine | Admitting: Family Medicine

## 2010-09-07 ENCOUNTER — Ambulatory Visit (INDEPENDENT_AMBULATORY_CARE_PROVIDER_SITE_OTHER)
Admission: RE | Admit: 2010-09-07 | Discharge: 2010-09-07 | Disposition: A | Discharge status: Home / Self Care | Payer: PRIVATE HEALTH INSURANCE | Source: Ambulatory Visit | Attending: Family Medicine | Admitting: Family Medicine

## 2010-09-07 ENCOUNTER — Encounter: Payer: Self-pay | Admitting: Family Medicine

## 2010-09-07 DIAGNOSIS — Z77090 Contact with and (suspected) exposure to asbestos: Secondary | ICD-10-CM

## 2010-09-07 DIAGNOSIS — T63441A Toxic effect of venom of bees, accidental (unintentional), initial encounter: Secondary | ICD-10-CM

## 2010-09-07 DIAGNOSIS — T6391XA Toxic effect of contact with unspecified venomous animal, accidental (unintentional), initial encounter: Secondary | ICD-10-CM

## 2010-09-07 DIAGNOSIS — K219 Gastro-esophageal reflux disease without esophagitis: Secondary | ICD-10-CM

## 2010-09-07 DIAGNOSIS — I10 Essential (primary) hypertension: Secondary | ICD-10-CM

## 2010-09-07 DIAGNOSIS — E785 Hyperlipidemia, unspecified: Secondary | ICD-10-CM

## 2010-09-07 DIAGNOSIS — R5383 Other fatigue: Secondary | ICD-10-CM

## 2010-09-07 DIAGNOSIS — T63461A Toxic effect of venom of wasps, accidental (unintentional), initial encounter: Secondary | ICD-10-CM

## 2010-09-07 DIAGNOSIS — R972 Elevated prostate specific antigen [PSA]: Secondary | ICD-10-CM

## 2010-09-07 LAB — LIPID PANEL
Cholesterol: 182 mg/dL (ref 0–200)
HDL: 38.1 mg/dL — ABNORMAL LOW (ref >39.00)
Triglycerides: 167 mg/dL — ABNORMAL HIGH (ref 0.0–149.0)
VLDL: 33.4 mg/dL (ref 0.0–40.0)

## 2010-09-07 LAB — BASIC METABOLIC PANEL
CO2: 30 mEq/L (ref 19–32)
Calcium: 9.2 mg/dL (ref 8.4–10.5)
Chloride: 105 mEq/L (ref 96–112)
Glucose, Bld: 92 mg/dL (ref 70–99)
Sodium: 141 mEq/L (ref 135–145)

## 2010-09-07 LAB — CBC WITH DIFFERENTIAL/PLATELET
Basophils Absolute: 0 10*3/uL (ref 0.0–0.1)
Eosinophils Absolute: 0.3 10*3/uL (ref 0.0–0.7)
HCT: 47.2 % (ref 39.0–52.0)
Hemoglobin: 16.2 g/dL (ref 13.0–17.0)
Lymphocytes Relative: 33.9 % (ref 12.0–46.0)
Lymphs Abs: 2.4 10*3/uL (ref 0.7–4.0)
MCHC: 34.4 g/dL (ref 30.0–36.0)
MCV: 93.3 fl (ref 78.0–100.0)
Monocytes Absolute: 0.4 10*3/uL (ref 0.1–1.0)
Neutro Abs: 4 10*3/uL (ref 1.4–7.7)
RDW: 14.6 % (ref 11.5–14.6)

## 2010-09-07 LAB — HEPATIC FUNCTION PANEL: Albumin: 3.6 g/dL (ref 3.5–5.2)

## 2010-09-07 MED ORDER — FINASTERIDE 5 MG PO TABS: 5.0000 mg | ORAL_TABLET | Freq: Every day | ORAL | Status: DC

## 2010-09-07 MED ORDER — TERAZOSIN HCL 10 MG PO CAPS: 10.0000 mg | ORAL_CAPSULE | Freq: Every day | ORAL | Status: DC

## 2010-09-07 MED ORDER — ATENOLOL 50 MG PO TABS: 50.0000 mg | ORAL_TABLET | Freq: Every day | ORAL | Status: DC

## 2010-09-07 MED ORDER — PRAVASTATIN SODIUM 40 MG PO TABS: 40.0000 mg | ORAL_TABLET | Freq: Every day | ORAL | Status: DC

## 2010-09-07 MED ORDER — ESOMEPRAZOLE MAGNESIUM 40 MG PO CPDR: 40.0000 mg | DELAYED_RELEASE_CAPSULE | Freq: Every day | ORAL | Status: DC

## 2010-09-07 MED ORDER — EPINEPHRINE 0.3 MG/0.3ML IJ DEVI: 0.3000 mg | Freq: Once | INTRAMUSCULAR | Status: AC

## 2010-09-07 NOTE — Patient Instructions (Signed)
Continue your good health habits.  Return in one year, sooner if any problems.  We will call you the report of your labs and chest x-ray

## 2010-09-07 NOTE — Progress Notes (Signed)
patient  Is aware 

## 2010-09-07 NOTE — Progress Notes (Signed)
  Subjective:    Patient ID: Sean Mitchell, male    DOB: 17-Jan-1934, 77 y.o.   MRN: [id number]  Gastrophageal Reflux  Hyperlipidemia  Hypertension    Sean Mitchell is a delightful, 77 year old, married male, nonsmoker, who comes in today for a Medicare wellness examination because of a history of hypertension, BPH, hyperlipidemia, reflux esophagitis, and a history of asbestos exposure.  He takes Nexium 40 mg daily because of a history of reflux esophagitis.  He takes Pravachol 40 mg nightly for hyperlipidemia.  He takes Tenormin 50 mg daily for mild hypertension.  BP normal 130/80.  He takes Proscar 5 mg daily for, BPH he's had 5 biopsies all of which have been negative.  He has had a history of a mass like to correction so we know his give him a new EpiPen.  He's had a history of asbestos exposure.  Will get yearly chest x-ray.  He gets routine eye care, dental care, normal activities of daily living.  He walks on a regular basis.  Weight stable 169.  No guns in the house.  Tetanus 2005, Pneumovax, x 2, shingles 2009, colonoscopy, 2009.  He does have a living will and health-care power-of-attorney      Review of Systems  Constitutional: Negative.   HENT: Negative.   Eyes: Negative.   Respiratory: Negative.   Cardiovascular: Negative.   Gastrointestinal: Negative.   Genitourinary: Negative.   Musculoskeletal: Negative.   Skin: Negative.   Neurological: Negative.   Hematological: Negative.   Psychiatric/Behavioral: Negative.        Objective:   Physical Exam  Constitutional: He is oriented to person, place, and time. He appears well-developed and well-nourished.  HENT:  Head: Normocephalic and atraumatic.  Right Ear: External ear normal.  Left Ear: External ear normal.  Nose: Nose normal.  Mouth/Throat: Oropharynx is clear and moist.  Eyes: Conjunctivae and EOM are normal. Pupils are equal, round, and reactive to light.  Neck: Normal range of motion. Neck supple. No  JVD present. No tracheal deviation present. No thyromegaly present.  Cardiovascular: Normal rate, regular rhythm, normal heart sounds and intact distal pulses.  Exam reveals no gallop and no friction rub.   No murmur heard. Pulmonary/Chest: Effort normal and breath sounds normal. No stridor. No respiratory distress. He has no wheezes. He has no rales. He exhibits no tenderness.  Abdominal: Soft. Bowel sounds are normal. He exhibits no distension and no mass. There is no tenderness. There is no rebound and no guarding.  Genitourinary: Rectum normal, prostate normal and penis normal. Guaiac negative stool. No penile tenderness.  Musculoskeletal: Normal range of motion. He exhibits no edema and no tenderness.  Lymphadenopathy:    He has no cervical adenopathy.  Neurological: He is alert and oriented to person, place, and time. He has normal reflexes. No cranial nerve deficit. He exhibits normal muscle tone.  Skin: Skin is warm and dry. No rash noted. No erythema. No pallor.  Psychiatric: He has a normal mood and affect. His behavior is normal. Judgment and thought content normal.          Assessment & Plan:  Healthy male.  Reflux esophagitis.  Continue Nexium 40 mg daily.  Pravachol for hyperlipidemia, 40 mg.  Hypertension.  Continue Tenormin 50 daily.  History of BPH.  Continue tears on 10 mg daily and Proscar 5 mg daily.  Will get chest x-ray because of a history of asbestos exposure.  Also given a new EpiPen.

## 2010-09-17 NOTE — Op Note (Signed)
[place]. [facility]  Patient:    Sean Mitchell, Sean Mitchell                        MRN: [id number] Proc. Date: 10/11/99 Adm. Date:  [id number] Disc. Date: [id number] Attending:  Lauree Chandler                           Operative Report  PREOPERATIVE DIAGNOSIS:  Benign prostatic hypertrophy and prostatism.  POSTOPERATIVE DIAGNOSIS:  Benign prostatic hypertrophy and prostatism.  PROCEDURE:  Targis microwave therapy of the prostate.  SURGEON:  Maretta Bees. Vonita Moss, M.D.  ANESTHESIA:  Sedation and local.  INDICATIONS:  This is a 77 year old gentleman who has had a long history of bladder outlet obstructive symptoms, unresponsive to medical therapy in that it did not fully relieve his symptomatology.  He was evaluated and found to have a 55 g prostate and a urethral length satisfactorily.  ______ to be a good candidate for microwave therapy of the prostate.  He was counseled about how it is done and risks of thermal injury and the fact that postoperatively he may have some increased voiding symptoms before he gets better.  DESCRIPTION OF PROCEDURE:  The patient is brought to the operating treatment suite.  There, he received sedation and antibiotics and pain medication. External genitalia were prepped and draped in the usual fashion.  Xylocaine jelly was injected per urethra.  A red Robinson catheter was inserted for Xylocaine solution to be placed in the bladder.  The MDS treatment probe was then inserted and 10 cc placed in the balloon, and the position correlated on transrectal ultrasound of the prostate.  Lidocaine jelly was injected per rectum and the rectal temperature probe was inserted.  At this point, his treatment was started and he was put on moderate _______ with 28 minute and 30 second treatment time with an MDS set at 39 degrees centigrade and cooling temperature at 8 degrees centigrade, and rectal temperature maxed at 42 degrees.  He had some  discomfort and bladder fullness during the procedure.  At the end of the 28.5 minute treatment time, he went through a five minute cooling period, after which, the treating probe was removed, as was the rectal temperature probe, and a 16-French Foley catheter inserted and connected to closed drainage. DD:  10/11/99 TD:  10/13/99 Job: [id number]

## 2010-10-07 ENCOUNTER — Encounter: Payer: Self-pay | Admitting: Family Medicine

## 2010-10-07 ENCOUNTER — Ambulatory Visit (INDEPENDENT_AMBULATORY_CARE_PROVIDER_SITE_OTHER): Payer: PRIVATE HEALTH INSURANCE | Admitting: Family Medicine

## 2010-10-07 DIAGNOSIS — H900 Conductive hearing loss, bilateral: Secondary | ICD-10-CM

## 2010-10-07 NOTE — Patient Instructions (Signed)
Return prn 

## 2010-10-07 NOTE — Progress Notes (Signed)
  Subjective:    Patient ID: Sean Mitchell, male    DOB: 10-09-1933, 77 y.o.   MRN: [id number]  Sean Mitchell is a 77 year old male, who comes in today for evaluation of hearing loss.  He said a history of hearing loss in the past secondary to ear wax and thinks he might have ear wax.  Again    Review of Systems    ENT review of systems otherwise negative Objective:   Physical Exam    Well-developed well-nourished man in acute distress.  Bilateral cerumen impactions removed with suction and irrigation    Assessment & Plan:  Hearing loss, secondary to cerumen impactions removed with suction and irrigation

## 2011-01-04 ENCOUNTER — Telehealth: Payer: Self-pay | Admitting: Gastroenterology

## 2011-01-04 NOTE — Telephone Encounter (Signed)
Pt with hx of rectal pain; surgery in 2010 at [facility] for Hyperplastic Hypertrophied Anal Papilla and then Prostate Bx. Last OV 05/19/2009 for rectal pain and Dr Jarold Motto placed him on Zinc Oxide paste instead of steroid creams. Pt reports he went back to the surgeon at [facility] and he stated he couldn't offer him anything; he did palpate the area. He reports the problem has been bothering him for some time, but yesterday it was really bad. Pt given an appt for 01/06/11 at 0845am.

## 2011-01-06 ENCOUNTER — Encounter: Payer: Self-pay | Admitting: Gastroenterology

## 2011-01-06 ENCOUNTER — Ambulatory Visit (INDEPENDENT_AMBULATORY_CARE_PROVIDER_SITE_OTHER): Payer: Medicare Other | Admitting: Gastroenterology

## 2011-01-06 VITALS — BP 128/78 | HR 64 | Ht 68.0 in | Wt 164.0 lb

## 2011-01-06 DIAGNOSIS — K6289 Other specified diseases of anus and rectum: Secondary | ICD-10-CM | POA: Insufficient documentation

## 2011-01-06 MED ORDER — MESALAMINE 1000 MG RE SUPP: 1000.0000 mg | Freq: Every day | RECTAL | Status: DC

## 2011-01-06 NOTE — Patient Instructions (Signed)
Use Canasa supp for per rectum at night, rx has been sent. Make an office visit to come back in one month.

## 2011-01-06 NOTE — Progress Notes (Signed)
This is a 77 year old Caucasian male who seemed to have a fixation about his anorectal area with previous multiple colonoscopies and treatment with various therapies of multiple creams and salves to his rectum. One year ago he had a large anal polyp removed by Dr. Vedia Pereyra at [facility]. Patient relates all his rectal problems are prostate biopsy done by Dr. Shiela Mayer in August of 2009. Currently has regular bowel movements but some painful bowel movements, but no rectal bleeding. He describes large stools and a throbbing sensation in his rectum. He does take daily Nexium for acid reflux. Last colonoscopy was in 2009 and was otherwise unremarkable. There is no history of anemia or other laboratory abnormalities. His appetite is good and his weight is stable. He denies any hepatobiliary or general medical problems.  Current Medications, Allergies, Past Medical History, Past Surgical History, Family History and Social History were reviewed in [employer] record.  Pertinent Review of Systems Negative   Physical Exam: Awake alert no acute distress appearing his stated age. Exam shows no organomegaly, masses or tenderness. Inspection of rectum is generally unremarkable without fissures or fistulae. Rectal exam does show some anal stenosis with a palpable slightly nodular prostate. I could not appreciate any rectal masses or tenderness otherwise. Stool is formed and guaiac negative.    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 Do not think we need to repeat his colonoscopy this time, but he may need followup flexible sigmoidoscopy. We will try Canasa 1 g suppositories at bedtime was added fiber supplements and office followup in several weeks' time. He is to continue other medications as per Dr. Tawanna Cooler in primary care. Encounter Diagnosis  Name Primary?  . Proctitis Yes

## 2011-02-08 ENCOUNTER — Ambulatory Visit (INDEPENDENT_AMBULATORY_CARE_PROVIDER_SITE_OTHER): Payer: Medicare Other | Admitting: Gastroenterology

## 2011-02-08 ENCOUNTER — Encounter: Payer: Self-pay | Admitting: Gastroenterology

## 2011-02-08 VITALS — BP 100/60 | HR 64 | Ht 68.0 in | Wt 169.0 lb

## 2011-02-08 DIAGNOSIS — K6289 Other specified diseases of anus and rectum: Secondary | ICD-10-CM

## 2011-02-08 MED ORDER — LIDOCAINE 5 % EX OINT: TOPICAL_OINTMENT | CUTANEOUS | Status: DC | PRN

## 2011-02-08 NOTE — Progress Notes (Signed)
History of Present Illness: This is a 77 year old Caucasian male with recurrent rectal pain of unexplained etiology. He had a colonoscopy 3 years ago with a large anal polyp which was removed surgically By Dr. Byrd Hesselbach at [facility]. He continues with rectal pain and after having a large bowel movement, but denies any abdominal cramping or hematochezia. Physical exam one month ago with anal dilatation and not help his symptomatology. He tried one week of Canasa 1 g suppositories without improvement. The patient does take daily Citrucel. Again, he size all of his rectal problems to a previous prostate biopsy.  Current Medications, Allergies, Past Medical History, Past Surgical History, Family History and Social History were reviewed in [employer] record.   Assessment and plan: Chronic anal pain of unexplained etiology, probable large psychological component. I have given him some 5% Xylocaine cream to use locally around his rectum as needed. I see no need for further GI or surgical evaluation at this time.   Please copy her primary care physician, referring physician, and pertinent subspecialists.  Encounter Diagnoses  Name Primary?  . Proctitis Yes  . Anal pain

## 2011-02-08 NOTE — Patient Instructions (Signed)
You will go to the lab for your stool test. Your prescription has been sent to your pharmacy.

## 2011-02-09 ENCOUNTER — Ambulatory Visit (INDEPENDENT_AMBULATORY_CARE_PROVIDER_SITE_OTHER): Payer: Medicare Other

## 2011-02-09 DIAGNOSIS — Z23 Encounter for immunization: Secondary | ICD-10-CM

## 2011-02-17 ENCOUNTER — Other Ambulatory Visit: Payer: Medicare Other

## 2011-02-18 ENCOUNTER — Other Ambulatory Visit: Payer: Self-pay | Admitting: Gastroenterology

## 2011-02-18 ENCOUNTER — Telehealth: Payer: Self-pay | Admitting: *Deleted

## 2011-02-18 LAB — FECAL OCCULT BLOOD, IMMUNOCHEMICAL: Fecal Occult Bld: NEGATIVE

## 2011-02-18 NOTE — Progress Notes (Signed)
Advised patient that stool was negative for blood. Patient verbalizes understanding.

## 2011-02-18 NOTE — Telephone Encounter (Signed)
Notified pt his stools were negative for blood.

## 2011-02-18 NOTE — Telephone Encounter (Signed)
Message copied by Florene Glen on Fri Feb 18, 2011  1:49 PM ------      Message from: PATTERSON, [place] R      Created: Fri Feb 18, 2011 12:37 PM       He is the one who should have gotten the letter that his toes are guaiac negative.

## 2011-07-19 ENCOUNTER — Other Ambulatory Visit: Payer: Self-pay

## 2011-07-19 DIAGNOSIS — K219 Gastro-esophageal reflux disease without esophagitis: Secondary | ICD-10-CM

## 2011-07-19 MED ORDER — ESOMEPRAZOLE MAGNESIUM 40 MG PO CPDR: 40.0000 mg | DELAYED_RELEASE_CAPSULE | Freq: Every day | ORAL | Status: DC

## 2011-07-19 NOTE — Telephone Encounter (Signed)
Rx sent to pharmacy for Nexium 40 mg.

## 2011-09-26 ENCOUNTER — Other Ambulatory Visit: Payer: Self-pay | Admitting: Family Medicine

## 2011-10-03 ENCOUNTER — Other Ambulatory Visit: Payer: Self-pay | Admitting: Family Medicine

## 2011-10-03 ENCOUNTER — Other Ambulatory Visit (INDEPENDENT_AMBULATORY_CARE_PROVIDER_SITE_OTHER): Payer: Medicare Other

## 2011-10-03 DIAGNOSIS — Z0000 Encounter for general adult medical examination without abnormal findings: Secondary | ICD-10-CM

## 2011-10-03 DIAGNOSIS — R972 Elevated prostate specific antigen [PSA]: Secondary | ICD-10-CM

## 2011-10-03 DIAGNOSIS — E785 Hyperlipidemia, unspecified: Secondary | ICD-10-CM

## 2011-10-03 DIAGNOSIS — Z87898 Personal history of other specified conditions: Secondary | ICD-10-CM

## 2011-10-03 DIAGNOSIS — K6289 Other specified diseases of anus and rectum: Secondary | ICD-10-CM

## 2011-10-03 DIAGNOSIS — I10 Essential (primary) hypertension: Secondary | ICD-10-CM

## 2011-10-03 LAB — CBC WITH DIFFERENTIAL/PLATELET
Basophils Relative: 0.2 % (ref 0.0–3.0)
Eosinophils Relative: 2.1 % (ref 0.0–5.0)
Hemoglobin: 16 g/dL (ref 13.0–17.0)
Lymphocytes Relative: 40.5 % (ref 12.0–46.0)
MCHC: 33.4 g/dL (ref 30.0–36.0)
Monocytes Relative: 6.4 % (ref 3.0–12.0)
Neutro Abs: 3.1 10*3/uL (ref 1.4–7.7)
Neutrophils Relative %: 50.8 % (ref 43.0–77.0)
RBC: 5.11 Mil/uL (ref 4.22–5.81)
WBC: 6 10*3/uL (ref 4.5–10.5)

## 2011-10-03 LAB — HEPATIC FUNCTION PANEL
AST: 21 U/L (ref 0–37)
Albumin: 3.4 g/dL — ABNORMAL LOW (ref 3.5–5.2)
Alkaline Phosphatase: 54 U/L (ref 39–117)
Bilirubin, Direct: 0.1 mg/dL (ref 0.0–0.3)
Total Protein: 5.9 g/dL — ABNORMAL LOW (ref 6.0–8.3)

## 2011-10-03 LAB — LIPID PANEL
HDL: 40.5 mg/dL (ref >39.00)
LDL Cholesterol: 114 mg/dL — ABNORMAL HIGH (ref 0–99)
Total CHOL/HDL Ratio: 5
Triglycerides: 175 mg/dL — ABNORMAL HIGH (ref 0.0–149.0)

## 2011-10-03 LAB — POCT URINALYSIS DIPSTICK
Blood, UA: NEGATIVE
Leukocytes, UA: NEGATIVE
Nitrite, UA: NEGATIVE
Urobilinogen, UA: 0.2
pH, UA: 5

## 2011-10-03 LAB — BASIC METABOLIC PANEL
CO2: 26 mEq/L (ref 19–32)
Calcium: 8.9 mg/dL (ref 8.4–10.5)
GFR: 69.26 mL/min (ref >60.00)
Sodium: 140 mEq/L (ref 135–145)

## 2011-10-10 ENCOUNTER — Encounter: Payer: Self-pay | Admitting: Family Medicine

## 2011-10-10 ENCOUNTER — Ambulatory Visit (INDEPENDENT_AMBULATORY_CARE_PROVIDER_SITE_OTHER)
Admission: RE | Admit: 2011-10-10 | Discharge: 2011-10-10 | Disposition: A | Discharge status: Home / Self Care | Payer: Medicare Other | Source: Ambulatory Visit | Attending: Family Medicine | Admitting: Family Medicine

## 2011-10-10 ENCOUNTER — Ambulatory Visit (INDEPENDENT_AMBULATORY_CARE_PROVIDER_SITE_OTHER): Payer: Medicare Other | Admitting: Family Medicine

## 2011-10-10 VITALS — BP 124/70 | HR 72 | Temp 97.8°F | Ht 67.5 in | Wt 168.3 lb

## 2011-10-10 DIAGNOSIS — Z77090 Contact with and (suspected) exposure to asbestos: Secondary | ICD-10-CM

## 2011-10-10 DIAGNOSIS — E785 Hyperlipidemia, unspecified: Secondary | ICD-10-CM

## 2011-10-10 DIAGNOSIS — K219 Gastro-esophageal reflux disease without esophagitis: Secondary | ICD-10-CM

## 2011-10-10 DIAGNOSIS — I10 Essential (primary) hypertension: Secondary | ICD-10-CM

## 2011-10-10 DIAGNOSIS — R972 Elevated prostate specific antigen [PSA]: Secondary | ICD-10-CM

## 2011-10-10 MED ORDER — ESOMEPRAZOLE MAGNESIUM 40 MG PO CPDR: 40.0000 mg | DELAYED_RELEASE_CAPSULE | Freq: Every day | ORAL | Status: DC

## 2011-10-10 MED ORDER — FINASTERIDE 5 MG PO TABS: 5.0000 mg | ORAL_TABLET | Freq: Every day | ORAL | Status: DC

## 2011-10-10 MED ORDER — PRAVASTATIN SODIUM 40 MG PO TABS: 40.0000 mg | ORAL_TABLET | ORAL | Status: DC

## 2011-10-10 MED ORDER — ATENOLOL 25 MG PO TABS: 25.0000 mg | ORAL_TABLET | Freq: Every day | ORAL | Status: DC

## 2011-10-10 MED ORDER — TERAZOSIN HCL 10 MG PO CAPS: 10.0000 mg | ORAL_CAPSULE | Freq: Every day | ORAL | Status: DC

## 2011-10-10 NOTE — Progress Notes (Signed)
  Subjective:    Patient ID: Sean Beat., male    DOB: 06-13-1933, 78 y.o.   MRN: [id number]  HPI Tramane is a 78 year old married male nonsmoker who comes in today for a Medicare wellness examination  He has a history of hypertension for which she takes Tenormin 25 mg Monday Wednesday Friday BP today 124/70 pulse 72 and regular  He takes Nexium 40 mg daily for reflux esophagitis  He takes Proscar 5 mg daily and Hytrin 10 mg daily for BPH with outlet obstruction  He takes Pravachol 40 mg daily and an aspirin tablet because of a history of hyperlipidemia.  His cognitive function is normal he walks on a regular basis home health safety reviewed no issues identified, no guns in the house, he does have a health care power of attorney and living well  He does not get routine eye care referred to Dr. Gweneth Dimitri. He gets routine dental care recent colonoscopy normal except for benign polyp around his rectum. Tetanus 2010, shingles 2009, Pneumovax x2   Review of Systems  Constitutional: Negative.   HENT: Positive for hearing loss.   Eyes: Negative.   Respiratory: Negative.   Cardiovascular: Negative.   Gastrointestinal: Negative.   Genitourinary: Negative.   Musculoskeletal: Negative.   Skin: Negative.   Neurological: Negative.   Hematological: Negative.   Psychiatric/Behavioral: Negative.        Objective:   Physical Exam  Constitutional: He is oriented to person, place, and time. He appears well-developed and well-nourished.  HENT:  Head: Normocephalic and atraumatic.  Right Ear: External ear normal.  Left Ear: External ear normal.  Nose: Nose normal.  Mouth/Throat: Oropharynx is clear and moist.  Eyes: Conjunctivae and EOM are normal. Pupils are equal, round, and reactive to light.  Neck: Normal range of motion. Neck supple. No JVD present. No tracheal deviation present. No thyromegaly present.  Cardiovascular: Normal rate, regular rhythm, normal heart sounds and intact  distal pulses.  Exam reveals no gallop and no friction rub.   No murmur heard. Pulmonary/Chest: Effort normal and breath sounds normal. No stridor. No respiratory distress. He has no wheezes. He has no rales. He exhibits no tenderness.  Abdominal: Soft. Bowel sounds are normal. He exhibits no distension and no mass. There is no tenderness. There is no rebound and no guarding.  Genitourinary: Rectum normal, prostate normal and penis normal. Guaiac negative stool. No penile tenderness.  Musculoskeletal: Normal range of motion. He exhibits no edema and no tenderness.  Lymphadenopathy:    He has no cervical adenopathy.  Neurological: He is alert and oriented to person, place, and time. He has normal reflexes. No cranial nerve deficit. He exhibits normal muscle tone.  Skin: Skin is warm and dry. No rash noted. No erythema. No pallor.  Psychiatric: He has a normal mood and affect. His behavior is normal. Judgment and thought content normal.          Assessment & Plan:  Healthy male  Hypertension continue Tenormin 25 mg Monday Wednesday Friday  BPH with outlet obstruction continue Proscar 5 mg and Hytrin 10 mg but one of each Monday Wednesday Friday  Hyperlipidemia continue Pravachol 40 mg daily and an aspirin tablet  Reflux esophagitis continue Nexium 40 mg daily  Hearing loss referred for an autogram

## 2011-10-10 NOTE — Progress Notes (Signed)
Quick Note:  Spoke with pt- informed cxr unchanged ______

## 2011-10-10 NOTE — Patient Instructions (Signed)
Tenormin 25 mg,,,,,,,,,,, 1 Monday Wednesday Friday  Proscar and Hytrin 1 of each Monday Wednesday Friday  Return in one year sooner if any problems  I would recommend he get an audiogram at [facility]  Return in one year sooner if any problems

## 2012-01-12 ENCOUNTER — Ambulatory Visit (INDEPENDENT_AMBULATORY_CARE_PROVIDER_SITE_OTHER): Payer: Medicare Other

## 2012-01-12 DIAGNOSIS — Z23 Encounter for immunization: Secondary | ICD-10-CM

## 2012-06-20 ENCOUNTER — Ambulatory Visit (INDEPENDENT_AMBULATORY_CARE_PROVIDER_SITE_OTHER): Payer: Medicare Other | Admitting: Family Medicine

## 2012-06-20 ENCOUNTER — Telehealth: Payer: Self-pay | Admitting: Family Medicine

## 2012-06-20 ENCOUNTER — Encounter: Payer: Self-pay | Admitting: Family Medicine

## 2012-06-20 VITALS — BP 140/80 | Temp 97.6°F | Wt 172.0 lb

## 2012-06-20 DIAGNOSIS — H8110 Benign paroxysmal vertigo, unspecified ear: Secondary | ICD-10-CM

## 2012-06-20 DIAGNOSIS — G609 Hereditary and idiopathic neuropathy, unspecified: Secondary | ICD-10-CM

## 2012-06-20 DIAGNOSIS — I10 Essential (primary) hypertension: Secondary | ICD-10-CM

## 2012-06-20 DIAGNOSIS — E785 Hyperlipidemia, unspecified: Secondary | ICD-10-CM

## 2012-06-20 NOTE — Telephone Encounter (Signed)
Patient Information:  Caller Name: Sean Mitchell  Phone: [phone]  Patient: Sean Mitchell, Sean Mitchell  Gender: Male  DOB: 10-10-1933  Age: 79 Years  PCP: Kelle Darting [facility])  Office Follow Up:  Does the office need to follow up with this patient?: No  Instructions For The Office: N/A   Symptoms  Reason For Call & Symptoms: Sean Mitchell, wife calling.  He woke up at midnight and had severe dizziness and a slight h/a.  Reviewed Health History In EMR: Yes  Reviewed Medications In EMR: Yes  Reviewed Allergies In EMR: Yes  Reviewed Surgeries / Procedures: Yes  Date of Onset of Symptoms: 06/20/2012  Guideline(s) Used:  Dizziness  Disposition Per Guideline:   Go to Office Now  Reason For Disposition Reached:   Lightheadedness (dizziness) present now, after 2 hours of rest and fluids  Advice Given:  N/A  Appointment Scheduled:  06/20/2012 10:00:00 Appointment Scheduled Provider:  Kelle Darting (Family Practice)  His son is bringing him to the 10a appt.  They may arrive early.  The son has a conference all for work at [employer] and Sean Mitchell said that he will be needing to back for that call.  I advised that his visit time and length would depend on Dr. Nelida Meuse morning schedule and what he felt needed to be done to care for her husband.

## 2012-06-20 NOTE — Progress Notes (Signed)
  Subjective:    Patient ID: Sean Beat., male    DOB: 14-Mar-1934, 79 y.o.   MRN: [id number]  HPI  Sean Mitchell is a 79 year old married male nonsmoker who comes in today for evaluation of vertigo  He states last night when he went to get into bed he noticed a slight spinning sensation. When he lies certain ways it would go away when he would move to the right then it would recur. It was mild. During the night when he had to get up to urinate he also had some other spells but again they were mild and is stop spontaneous.. No hearing loss  Had an episode of vertigo about 10 days ago but that episode lasted for a few seconds and went away  Review of Systems Neurologic and in nose and throat review of systems otherwise negative    Objective:   Physical Exam Well-developed well nourished male no acute distress HEENT negative except for some moderate ear wax neck was supple neurologic exam he is oriented x3 cranial nerves are normal cerebellar testing normal       Assessment & Plan:  Benign positional vertigo reassured  Iliacs it wax kit

## 2012-06-20 NOTE — Patient Instructions (Signed)
Purchase an ear wax kit,,,,,,,,, 2 drops in each ear canal bedtime for 2 weeks then flushed with warm water  Return in the summer for your annual physical examination  Labs one week prior

## 2012-07-24 ENCOUNTER — Encounter: Payer: Self-pay | Admitting: Family

## 2012-07-24 ENCOUNTER — Ambulatory Visit (INDEPENDENT_AMBULATORY_CARE_PROVIDER_SITE_OTHER): Payer: Medicare Other | Admitting: Family

## 2012-07-24 VITALS — BP 130/70 | HR 83 | Wt 175.0 lb

## 2012-07-24 DIAGNOSIS — H6123 Impacted cerumen, bilateral: Secondary | ICD-10-CM

## 2012-07-24 DIAGNOSIS — H6120 Impacted cerumen, unspecified ear: Secondary | ICD-10-CM

## 2012-07-24 NOTE — Patient Instructions (Signed)
Cerumen Plug A cerumen plug is having too much wax in your ear canal. The outer ear canal is lined with hairs and glands that secrete wax. This wax is called cerumen. This protects the ear canal. It also helps prevent material from entering the ear. Too much wax can cause a feeling of fullness in the ears, decreased hearing, ringing in the ears, or an earache. Sometimes your caregiver will remove a cerumen plug with an instrument called a curette. Or he/she may flush the ear canal with warm water from a syringe to remove the wax. You may simply be sent home to follow the home care instructions below for wax removal. Generally ear wax does not have to be removed unless it is causing a problem such as one of those listed above. When too much wax is causing a problem, the following are a few home remedies which can be used to help this problem. HOME CARE INSTRUCTIONS   Put a couple drops of glycerin, baby oil, or mineral oil in the ear a couple times of day. Do this every day for several days. After putting the drops in, you will need to lay with the affected ear pointing up for a couple minutes. This allows the drops to remain in the canal and run down to the area of wax blockage. This will soften the wax plug. It may also make your hearing worse as the wax softens and blocks the canal even more.  After a couple days, you may gently flush the ear canal with warm water from a syringe. Do this by pulling your ear up and back with your head tilted slightly forward and towards a pan to catch the water. This is most easily done with a helper. You can also accomplish the same thing by letting the shower beat into your ear canal to wash the wax out. Sometimes this will not be immediately successful. You will have to return to the first step of using the oil to further soften the wax. Then resume washing the ear canal out with a syringe or shower.  Following removal of the wax, put ten to twenty drops of rubbing  alcohol into the outer ears. This will dry the canal and prevent an infection.  Do not irrigate or wash out your ears if you have had a perforated ear drum or mastoid surgery. SEEK IMMEDIATE MEDICAL CARE IF:   You are unsuccessful with the above instructions for home care.  You develop ear pain or drainage from the ear. MAKE SURE YOU:   Understand these instructions.  Will watch your condition.  Will get help right away if you are not doing well or get worse. Document Released: 01/11/2001 Document Revised: 07/11/2011 Document Reviewed: 04/09/2008 [facility] Patient Information 2013 [place], [place].

## 2012-07-24 NOTE — Progress Notes (Signed)
Subjective:    Patient ID: Sean Beat., male    DOB: 1933-12-24, 79 y.o.   MRN: [id number]  HPI 79 year old white male, nonsmoker, patient of Dr. Tawanna Cooler is in today requesting a half his ears lavaged. Has a chronic history of ear wax buildup.   Review of Systems  Constitutional: Negative.   HENT:       Ears clogged with wax  Respiratory: Negative.   Cardiovascular: Negative.   Neurological: Negative.   Psychiatric/Behavioral: Negative.    Past Medical History  Diagnosis Date  . Personal history of contact with and (suspected) exposure to asbestos   . Unspecified essential hypertension   . Other and unspecified hyperlipidemia   . Esophageal reflux   . Diverticulosis of colon (without mention of hemorrhage)   . Elevated prostate specific antigen (PSA)   . Osteoarthrosis, unspecified whether generalized or localized, unspecified site   . Anxiety state, unspecified   . Rectal mass     History   Social History  . Marital Status: Married    Spouse Name: N/A    Number of Children: 2  . Years of Education: N/A   Occupational History  . retired    Social History Main Topics  . Smoking status: Never Smoker   . Smokeless tobacco: Not on file  . Alcohol Use: No  . Drug Use: No  . Sexually Active: Not on file   Other Topics Concern  . Not on file   Social History Narrative  . No narrative on file    Past Surgical History  Procedure Laterality Date  . Inguinal hernia repair      left    Family History  Problem Relation Age of Onset  . Coronary artery disease Father   . Coronary artery disease Mother   . Heart attack Brother   . Colon cancer Father     ? not exactly sure    No Known Allergies  Current Outpatient Prescriptions on File Prior to Visit  Medication Sig Dispense Refill  . aspirin 81 MG tablet Take 81 mg by mouth daily.        Marland Kitchen atenolol (TENORMIN) 25 MG tablet Take 1 tablet (25 mg total) by mouth daily.  90 tablet  3  . esomeprazole  (NEXIUM) 40 MG capsule Take 1 capsule (40 mg total) by mouth daily before breakfast.  100 capsule  3  . finasteride (PROSCAR) 5 MG tablet Take 1 tablet (5 mg total) by mouth daily.  100 tablet  3  . fish oil-omega-3 fatty acids 1000 MG capsule Take 2 g by mouth 2 (two) times daily.       Marland Kitchen glucosamine-chondroitin 500-400 MG tablet Take 1 tablet by mouth 2 (two) times daily.       . multivitamin (THERAGRAN) per tablet Take 1 tablet by mouth daily.        . pravastatin (PRAVACHOL) 40 MG tablet Take 1 tablet (40 mg total) by mouth every other day.  100 tablet  3  . terazosin (HYTRIN) 10 MG capsule Take 1 capsule (10 mg total) by mouth at bedtime.  100 capsule  2   No current facility-administered medications on file prior to visit.    BP 130/70  Pulse 83  Wt 175 lb (79.379 kg)  BMI 26.99 kg/m2  SpO2 96%chart    Objective:   Physical Exam  Constitutional: He is oriented to person, place, and time. He appears well-developed and well-nourished.  HENT:  Right Ear: External ear  normal.  Left Ear: External ear normal.  Cardiovascular: Normal rate and regular rhythm.   Pulmonary/Chest: Effort normal and breath sounds normal.  Neurological: He is alert and oriented to person, place, and time.  Skin: Skin is warm and dry.  Psychiatric: He has a normal mood and affect.     Informed consent was obtained and peroxide gel was inserted into the ears bilaterally using the lavage kit the ears were lavaged until clean.Inspection with a cerumen spoon removed residual wax. Patient tolerated the procedure well.     Assessment & Plan:  Assessment:  1. Cerumen Impaction  Plan: Recheck as needed.

## 2012-10-03 ENCOUNTER — Other Ambulatory Visit (INDEPENDENT_AMBULATORY_CARE_PROVIDER_SITE_OTHER): Payer: Medicare Other

## 2012-10-03 DIAGNOSIS — R972 Elevated prostate specific antigen [PSA]: Secondary | ICD-10-CM

## 2012-10-03 DIAGNOSIS — I10 Essential (primary) hypertension: Secondary | ICD-10-CM

## 2012-10-03 DIAGNOSIS — E785 Hyperlipidemia, unspecified: Secondary | ICD-10-CM

## 2012-10-03 LAB — BASIC METABOLIC PANEL
CO2: 28 mEq/L (ref 19–32)
Chloride: 105 mEq/L (ref 96–112)
Glucose, Bld: 90 mg/dL (ref 70–99)
Potassium: 3.8 mEq/L (ref 3.5–5.1)
Sodium: 141 mEq/L (ref 135–145)

## 2012-10-03 LAB — CBC WITH DIFFERENTIAL/PLATELET
Basophils Relative: 0.3 % (ref 0.0–3.0)
Eosinophils Absolute: 0.3 10*3/uL (ref 0.0–0.7)
Hemoglobin: 16.2 g/dL (ref 13.0–17.0)
Lymphocytes Relative: 41.7 % (ref 12.0–46.0)
MCHC: 32.9 g/dL (ref 30.0–36.0)
MCV: 96 fl (ref 78.0–100.0)
Neutro Abs: 3.3 10*3/uL (ref 1.4–7.7)
RBC: 5.14 Mil/uL (ref 4.22–5.81)

## 2012-10-03 LAB — POCT URINALYSIS DIPSTICK
Bilirubin, UA: NEGATIVE
Blood, UA: NEGATIVE
Glucose, UA: NEGATIVE
Spec Grav, UA: 1.02
Urobilinogen, UA: 0.2

## 2012-10-03 LAB — LIPID PANEL
Cholesterol: 183 mg/dL (ref 0–200)
HDL: 34.9 mg/dL — ABNORMAL LOW
LDL Cholesterol: 111 mg/dL — ABNORMAL HIGH (ref 0–99)
Total CHOL/HDL Ratio: 5
Triglycerides: 185 mg/dL — ABNORMAL HIGH (ref 0.0–149.0)
VLDL: 37 mg/dL (ref 0.0–40.0)

## 2012-10-03 LAB — HEPATIC FUNCTION PANEL
Albumin: 3.5 g/dL (ref 3.5–5.2)
Alkaline Phosphatase: 45 U/L (ref 39–117)
Bilirubin, Direct: 0.1 mg/dL (ref 0.0–0.3)
Total Bilirubin: 0.9 mg/dL (ref 0.3–1.2)

## 2012-10-03 NOTE — Patient Instructions (Signed)
, °

## 2012-10-10 ENCOUNTER — Ambulatory Visit (INDEPENDENT_AMBULATORY_CARE_PROVIDER_SITE_OTHER): Payer: Medicare Other | Admitting: Family Medicine

## 2012-10-10 ENCOUNTER — Encounter: Payer: Self-pay | Admitting: Family Medicine

## 2012-10-10 ENCOUNTER — Ambulatory Visit (INDEPENDENT_AMBULATORY_CARE_PROVIDER_SITE_OTHER)
Admission: RE | Admit: 2012-10-10 | Discharge: 2012-10-10 | Disposition: A | Discharge status: Home / Self Care | Payer: Medicare Other | Source: Ambulatory Visit | Attending: Family Medicine | Admitting: Family Medicine

## 2012-10-10 VITALS — BP 110/70 | Temp 98.1°F | Ht 67.5 in | Wt 172.0 lb

## 2012-10-10 DIAGNOSIS — Z77090 Contact with and (suspected) exposure to asbestos: Secondary | ICD-10-CM

## 2012-10-10 DIAGNOSIS — R972 Elevated prostate specific antigen [PSA]: Secondary | ICD-10-CM

## 2012-10-10 DIAGNOSIS — T7840XD Allergy, unspecified, subsequent encounter: Secondary | ICD-10-CM

## 2012-10-10 DIAGNOSIS — M1990 Unspecified osteoarthritis, unspecified site: Secondary | ICD-10-CM

## 2012-10-10 DIAGNOSIS — H900 Conductive hearing loss, bilateral: Secondary | ICD-10-CM

## 2012-10-10 DIAGNOSIS — Z0000 Encounter for general adult medical examination without abnormal findings: Secondary | ICD-10-CM

## 2012-10-10 DIAGNOSIS — E785 Hyperlipidemia, unspecified: Secondary | ICD-10-CM

## 2012-10-10 DIAGNOSIS — I10 Essential (primary) hypertension: Secondary | ICD-10-CM

## 2012-10-10 DIAGNOSIS — K219 Gastro-esophageal reflux disease without esophagitis: Secondary | ICD-10-CM

## 2012-10-10 DIAGNOSIS — Z5189 Encounter for other specified aftercare: Secondary | ICD-10-CM

## 2012-10-10 MED ORDER — FINASTERIDE 5 MG PO TABS: 5.0000 mg | ORAL_TABLET | Freq: Every day | ORAL | Status: DC

## 2012-10-10 MED ORDER — TERAZOSIN HCL 10 MG PO CAPS: 10.0000 mg | ORAL_CAPSULE | Freq: Every day | ORAL | Status: DC

## 2012-10-10 MED ORDER — ESOMEPRAZOLE MAGNESIUM 40 MG PO CPDR: 40.0000 mg | DELAYED_RELEASE_CAPSULE | Freq: Every day | ORAL | Status: DC

## 2012-10-10 MED ORDER — EPINEPHRINE 0.3 MG/0.3ML IJ SOAJ: 0.3000 mg | Freq: Once | INTRAMUSCULAR | Status: AC

## 2012-10-10 MED ORDER — PRAVASTATIN SODIUM 40 MG PO TABS: 40.0000 mg | ORAL_TABLET | ORAL | Status: DC

## 2012-10-10 NOTE — Patient Instructions (Addendum)
C. Dr. Gweneth Dimitri for an eye exam  Motrin 400 mg twice daily and elevation and ice for your knees when you get done walking  Continue other medications  Return in one year sooner if any problems

## 2012-10-10 NOTE — Progress Notes (Signed)
  Subjective:    Patient ID: Sean Beat., male    DOB: 1934-02-07, 79 y.o.   MRN: [id number]  HPI  Sean Mitchell is a 79 year old married male nonsmoker who comes in today for a Medicare wellness examination because of a history of chronic reflux esophagitis, BPH with outlet obstruction and mild elevation of PSA,,,,,,,,,,,,, he had a prostate biopsy,,,,,,,,,,,,, set was terrible and never wants another one,,,,,,,,,,,,, he's on Pravachol for hyperlipidemia  He gets routine eye care, dental care, he declines any further colonoscopies because of the pain associated with the procedure  Cognitive function normal he walks on a regular basis home health safety reviewed no issues identified, no guns in the house, he does have a health care power of attorney and living will  Review of Systems  Constitutional: Negative.   HENT: Negative.   Eyes: Negative.   Respiratory: Negative.   Cardiovascular: Negative.   Gastrointestinal: Negative.   Genitourinary: Negative.   Musculoskeletal: Negative.   Skin: Negative.   Neurological: Negative.   Psychiatric/Behavioral: Negative.        Objective:   Physical Exam  Constitutional: He is oriented to person, place, and time. He appears well-developed and well-nourished.  HENT:  Head: Normocephalic and atraumatic.  Right Ear: External ear normal.  Left Ear: External ear normal.  Nose: Nose normal.  Mouth/Throat: Oropharynx is clear and moist.  Significant hearing loss  Eyes: Conjunctivae and EOM are normal. Pupils are equal, round, and reactive to light.  Neck: Normal range of motion. Neck supple. No JVD present. No tracheal deviation present. No thyromegaly present.  Cardiovascular: Normal rate, regular rhythm, normal heart sounds and intact distal pulses.  Exam reveals no gallop and no friction rub.   No murmur heard. Pulmonary/Chest: Effort normal and breath sounds normal. No stridor. No respiratory distress. He has no wheezes. He has no rales. He  exhibits no tenderness.  Abdominal: Soft. Bowel sounds are normal. He exhibits no distension and no mass. There is no tenderness. There is no rebound and no guarding.  Genitourinary: Rectum normal, prostate normal and penis normal. Guaiac negative stool. No penile tenderness.  Musculoskeletal: Normal range of motion. He exhibits no edema and no tenderness.  Lymphadenopathy:    He has no cervical adenopathy.  Neurological: He is alert and oriented to person, place, and time. He has normal reflexes. No cranial nerve deficit. He exhibits normal muscle tone.  Skin: Skin is warm and dry. No rash noted. No erythema. No pallor.  Total body skin exam normal he has a scar on was nose that his dermatologist has frozen repeatedly. Dr. Terri Piedra told him it was not a skin cancer  Psychiatric: He has a normal mood and affect. His behavior is normal. Judgment and thought content normal.          Assessment & Plan:  Healthy male  Chronic reflux esophagitis continue Nexium 40 mg daily  History of BPH on Proscar and Hytrin  History of elevated PSA PSA has actually gone from 8.97 last year to 7.91 this year.  Hyperlipidemia continue Pravachol 40 mg daily  Osteoarthritis,,,,,,,,, Motrin 400 twice a day  History of asbestos exposure yearly chest x-ray

## 2012-10-29 ENCOUNTER — Other Ambulatory Visit: Payer: Self-pay | Admitting: Family Medicine

## 2013-01-22 ENCOUNTER — Telehealth: Payer: Self-pay | Admitting: Family Medicine

## 2013-01-22 MED ORDER — ATENOLOL 25 MG PO TABS: ORAL_TABLET | ORAL | Status: DC

## 2013-01-22 NOTE — Telephone Encounter (Signed)
Pt needs refill on atenolol 25 mg #90 with 3 refills sent to [facility] [address]. Pt had cpx in June 14

## 2013-01-23 ENCOUNTER — Ambulatory Visit (INDEPENDENT_AMBULATORY_CARE_PROVIDER_SITE_OTHER): Payer: Medicare Other

## 2013-01-23 DIAGNOSIS — Z23 Encounter for immunization: Secondary | ICD-10-CM

## 2013-09-02 ENCOUNTER — Ambulatory Visit (INDEPENDENT_AMBULATORY_CARE_PROVIDER_SITE_OTHER)
Admission: RE | Admit: 2013-09-02 | Discharge: 2013-09-02 | Disposition: A | Discharge status: Home / Self Care | Payer: Medicare Other | Source: Ambulatory Visit | Attending: Family Medicine | Admitting: Family Medicine

## 2013-09-02 ENCOUNTER — Ambulatory Visit (INDEPENDENT_AMBULATORY_CARE_PROVIDER_SITE_OTHER): Payer: Medicare Other | Admitting: Family Medicine

## 2013-09-02 ENCOUNTER — Encounter: Payer: Self-pay | Admitting: Family Medicine

## 2013-09-02 ENCOUNTER — Telehealth: Payer: Self-pay | Admitting: Family Medicine

## 2013-09-02 VITALS — BP 140/90 | HR 76 | Temp 98.3°F | Wt 175.0 lb

## 2013-09-02 DIAGNOSIS — Z77090 Contact with and (suspected) exposure to asbestos: Secondary | ICD-10-CM

## 2013-09-02 DIAGNOSIS — J129 Viral pneumonia, unspecified: Secondary | ICD-10-CM

## 2013-09-02 MED ORDER — PREDNISONE 20 MG PO TABS: ORAL_TABLET | ORAL | Status: DC

## 2013-09-02 MED ORDER — HYDROCODONE-HOMATROPINE 5-1.5 MG/5ML PO SYRP: 5.0000 mL | ORAL_SOLUTION | Freq: Three times a day (TID) | ORAL | Status: DC | PRN

## 2013-09-02 MED ORDER — DOXYCYCLINE HYCLATE 100 MG PO TABS: 100.0000 mg | ORAL_TABLET | Freq: Two times a day (BID) | ORAL | Status: DC

## 2013-09-02 NOTE — Telephone Encounter (Signed)
[facility] x-ray ordered patient is aware

## 2013-09-02 NOTE — Progress Notes (Signed)
Pre visit review using our clinic review tool, if applicable. No additional management support is needed unless otherwise documented below in the visit note. 

## 2013-09-02 NOTE — Telephone Encounter (Signed)
Patient Information:  Caller Name: Kordell  Phone: [phone]  Patient: Sean Mitchell, Sean Mitchell  Gender: Male  DOB: December 28, 1933  Age: 80 Years  PCP: Stevie Kern [facility])  Office Follow Up:  Does the office need to follow up with this patient?: Yes  Instructions For The Office: Pt would like to have chest xray prior to 1445 appt today, 5-4.  Please reivew w/ MD and f/u w/ Pt.  RN Note:  ER CALL. Intermittent dry Cough w/ SOB, onset 1 week. Pt has some Right Rib pain w/ Cough.  Pt denies fever, constant Chest pain or wheeze. Pt is able to take deep breath. States, "I feel I need to cough b/c something is not right w/ my Lung". Pt is sleeping normally.  Pt was exposed to asbestos years ago, states he gets yearly xray.  All emergent sxs ruled out per Cough protocol, see today or tomorrow d/t Pt would like to see Dr Sherren Mocha.  Symptoms  Reason For Call & Symptoms: ER CALL. Dry Cough w/ SOB, onset 1 week.  Reviewed Health History In EMR: N/A  Reviewed Medications In EMR: N/A  Reviewed Allergies In EMR: N/A  Reviewed Surgeries / Procedures: N/A  Date of Onset of Symptoms: 08/26/2013  Treatments Tried: ASA 81 mg before bed on 5-3, Loratadine  Treatments Tried Worked: No  Guideline(s) Used:  Cough  Disposition Per Guideline:   See Today or Tomorrow in Office  Reason For Disposition Reached:   Patient wants to be seen  Advice Given:  Reassurance  Coughing is the way that our lungs remove irritants and mucus. It helps protect our lungs from getting pneumonia.  You can get a dry hacking cough after a chest cold. Sometimes this type of cough can last 1-3 weeks, and be worse at night.  Cough Medicines:  Home Remedy - Honey: This old home remedy has been shown to help decrease coughing at night. The adult dosage is 2 teaspoons (10 ml) at bedtime. Honey should not be given to infants under one year of age.  Coughing Spasms:  Drink warm fluids. Inhale warm mist (Reason: both relax the airway and  loosen up the phlegm).  Call Back If:  You become worse.  Patient Will Follow Care Advice:  YES  Appointment Scheduled:  09/02/2013 14:45:00 Appointment Scheduled Provider:  Stevie Kern [facility])

## 2013-09-02 NOTE — Patient Instructions (Signed)
Doxycycline 100 mg............ one twice daily for 10 days  Hydromet..........Marland Kitchen 1/2-1 teaspoon 3 times daily. For cough  Prednisone 20 mg......... 2 tabs for 3 days then taper as outlined  Return May 19 for followup

## 2013-09-02 NOTE — Progress Notes (Signed)
   Subjective:    Patient ID: Sean Mitchell., male    DOB: Nov 12, 1933, 80 y.o.   MRN: [id number]  HPI Sean Mitchell is a 80 year old married male nonsmoker who comes in today for evaluation of a cough for 2 weeks  He states that 2 weeks ago he developed some head congestion postnasal drip and cough. In the last couple days he says his shortness of breath. He's had no fever chills sputum production earache sore throat nausea vomiting or diarrhea. He's been able to sleep well at night.  He was concerned because he has a history of his testis exposure and self requested a chest x-ray. X-ray shows some atelectasis a right lower lobe along with upright pleural effusion. This is probably a viral pneumonia with a secondary pleural effusion.     Review of Systems    review of systems negative Objective:   Physical Exam  Well-developed and nourished male no acute distress vital signs stable he is afebrile HEENT negative neck was supple no adenopathy lungs are clear except for inspiratory and expiratory mild wheezing      Assessment & Plan:  Viral pneumonia,,,,,,,,,, empirically cover with antibiotics followup in 3 weeks with office visit and chest x-ray

## 2013-09-09 ENCOUNTER — Ambulatory Visit (INDEPENDENT_AMBULATORY_CARE_PROVIDER_SITE_OTHER): Payer: Medicare Other | Admitting: Family Medicine

## 2013-09-09 ENCOUNTER — Encounter: Payer: Self-pay | Admitting: Family Medicine

## 2013-09-09 VITALS — BP 140/80 | HR 81 | Temp 98.0°F | Ht 67.5 in | Wt 175.0 lb

## 2013-09-09 DIAGNOSIS — J189 Pneumonia, unspecified organism: Secondary | ICD-10-CM

## 2013-09-09 DIAGNOSIS — F411 Generalized anxiety disorder: Secondary | ICD-10-CM

## 2013-09-09 DIAGNOSIS — J90 Pleural effusion, not elsewhere classified: Secondary | ICD-10-CM

## 2013-09-09 NOTE — Progress Notes (Signed)
No chief complaint on file.   HPI:  Acute visit for:  1)CAP: -seen by PCP last week and treated with doxy, prednisone and cough medication thought to be viral per PCP notes as started with nasal congestion, viral symptoms then progressed to mild SOB and cough  -CXR with RLL pna and pleural effusion  -he has follow up with PCP next week -he reports: feels somewhat better but he thought he would be %100 better by now and still having intermittent cough, fatigue and DOE wife wanted to bring him in to see what was going on  -wife is a Marine scientist and reports didn't know he had pneumonia as she was not at last visit -denies: fevers, chills, vomiting, worsening -wife admits he tend to be overly anxious  ROS: See pertinent positives and negatives per HPI.  Past Medical History  Diagnosis Date  . Personal history of contact with and (suspected) exposure to asbestos   . Unspecified essential hypertension   . Other and unspecified hyperlipidemia   . Esophageal reflux   . Diverticulosis of colon (without mention of hemorrhage)   . Elevated prostate specific antigen (PSA)   . Osteoarthrosis, unspecified whether generalized or localized, unspecified site   . Anxiety state, unspecified   . Rectal mass     Past Surgical History  Procedure Laterality Date  . Inguinal hernia repair      left    Family History  Problem Relation Age of Onset  . Coronary artery disease Father   . Coronary artery disease Mother   . Heart attack Brother   . Colon cancer Father     ? not exactly sure    History   Social History  . Marital Status: Married    Spouse Name: N/A    Number of Children: 2  . Years of Education: N/A   Occupational History  . retired    Social History Main Topics  . Smoking status: Never Smoker   . Smokeless tobacco: None  . Alcohol Use: No  . Drug Use: No  . Sexual Activity: None   Other Topics Concern  . None   Social History Narrative  . None    Current outpatient  prescriptions:aspirin 81 MG tablet, Take 81 mg by mouth daily.  , Disp: , Rfl: ;  atenolol (TENORMIN) 25 MG tablet, TAKE ONE TABLET BY MOUTH DAILY, Disp: 90 tablet, Rfl: 3;  doxycycline (VIBRA-TABS) 100 MG tablet, Take 1 tablet (100 mg total) by mouth 2 (two) times daily., Disp: 20 tablet, Rfl: 1;  EPINEPHrine (EPIPEN 2-PAK) 0.3 mg/0.3 mL DEVI, Inject 0.3 mLs (0.3 mg total) into the muscle once., Disp: 1 Device, Rfl: 1 esomeprazole (NEXIUM) 20 MG capsule, Take 20 mg by mouth daily at 12 noon., Disp: , Rfl: ;  finasteride (PROSCAR) 5 MG tablet, Take 1 tablet (5 mg total) by mouth daily., Disp: 100 tablet, Rfl: 3;  fish oil-omega-3 fatty acids 1000 MG capsule, Take 2 g by mouth 2 (two) times daily. , Disp: , Rfl: ;  glucosamine-chondroitin 500-400 MG tablet, Take 1 tablet by mouth 2 (two) times daily. , Disp: , Rfl:  HYDROcodone-homatropine (HYCODAN) 5-1.5 MG/5ML syrup, Take 5 mLs by mouth every 8 (eight) hours as needed for cough., Disp: 240 mL, Rfl: 0;  multivitamin (THERAGRAN) per tablet, Take 1 tablet by mouth daily.  , Disp: , Rfl: ;  pravastatin (PRAVACHOL) 40 MG tablet, Take 1 tablet (40 mg total) by mouth every other day., Disp: 100 tablet, Rfl: 3 predniSONE (  DELTASONE) 20 MG tablet, 2 tabs x 3 days, 1 tab x 3 days, 1/2 tab x 3 days, 1/2 tab M,W,F x 2 weeks, Disp: 40 tablet, Rfl: 1;  terazosin (HYTRIN) 10 MG capsule, Take 1 capsule (10 mg total) by mouth at bedtime., Disp: 100 capsule, Rfl: 3  EXAM:  Filed Vitals:   09/09/13 1420  BP: 140/80  Pulse: 81  Temp: 98 F (36.7 C)    Body mass index is 26.99 kg/(m^2).  GENERAL: vitals reviewed and listed above, alert, oriented, appears well hydrated and in no acute distress  HEENT: atraumatic, conjunttiva clear, no obvious abnormalities on inspection of external nose and ears  NECK: no obvious masses on inspection  LUNGS: clear to auscultation bilaterally, no wheezes, rales or rhonchi, good air movement  CV: HRRR, no peripheral edema  MS:  moves all extremities without noticeable abnormality  PSYCH: pleasant and cooperative, no obvious depression or anxiety  ASSESSMENT AND PLAN:  Discussed the following assessment and plan:  CAP (community acquired pneumonia)  -seems to be improving and lungs sounds in both bases today -we discussed possible serious and likely etiologies, workup and treatment, treatment risks and return precautions, discussed typical course of CAP and causes of this and pleural effusion -after this discussion, Burt opted for continuation of abx, dep breathing exercises and follow up with PCP as scheduled -of course, we advised Anubis  to return or notify a doctor immediately if symptoms worsen or persist or new concerns arise.  -Patient advised to return or notify a doctor immediately if symptoms worsen or persist or new concerns arise.  There are no Patient Instructions on file for this visit.   Lucretia Kern

## 2013-09-09 NOTE — Progress Notes (Signed)
Pre visit review using our clinic review tool, if applicable. No additional management support is needed unless otherwise documented below in the visit note. 

## 2013-09-17 ENCOUNTER — Ambulatory Visit (INDEPENDENT_AMBULATORY_CARE_PROVIDER_SITE_OTHER): Payer: Medicare Other | Admitting: Family Medicine

## 2013-09-17 ENCOUNTER — Ambulatory Visit (INDEPENDENT_AMBULATORY_CARE_PROVIDER_SITE_OTHER)
Admission: RE | Admit: 2013-09-17 | Discharge: 2013-09-17 | Disposition: A | Discharge status: Home / Self Care | Payer: Medicare Other | Source: Ambulatory Visit | Attending: Family Medicine | Admitting: Family Medicine

## 2013-09-17 ENCOUNTER — Encounter: Payer: Self-pay | Admitting: Family Medicine

## 2013-09-17 VITALS — BP 130/90 | Temp 97.6°F

## 2013-09-17 DIAGNOSIS — J129 Viral pneumonia, unspecified: Secondary | ICD-10-CM

## 2013-09-17 DIAGNOSIS — J90 Pleural effusion, not elsewhere classified: Secondary | ICD-10-CM

## 2013-09-17 NOTE — Patient Instructions (Signed)
Taper the prednisone for one more week  Chest x-ray today  We will get you set up for a pulmonary consult for further evaluation

## 2013-09-17 NOTE — Progress Notes (Signed)
   Subjective:    Patient ID: Sean Mitchell., male    DOB: August 04, 1933, 80 y.o.   MRN: [id number]  HPI Kuzey is a 81 year old married male nonsmoker who comes in today for followup  We saw him a couple weeks ago with a viral type pneumonia however he was bringing up some discolored sputum. Chest x-ray showed a slight right pleural effusion. Also at that time he was wheezing. We put him on a ten-day course of doxycycline which she has finished and prednisone for the wheezing. The standard 10 mg Monday Wednesday Friday and still doesn't feel well. No new symptoms   Review of Systems Review of systems otherwise negative    Objective:   Physical Exam  Well-developed well-nourished male in no acute distress vital signs stable he is afebrile examination lungs show symmetrical wheezing decreased breath sounds right lower lobe consistent with the effusion in the right lower lobe pneumonia      Assessment & Plan:  Persistent symptoms of cough with right lower lobe infiltrate and pleural effusion,,,,,,,,,,, followup chest x-ray today pulmonary consult

## 2013-09-17 NOTE — Progress Notes (Signed)
Pre visit review using our clinic review tool, if applicable. No additional management support is needed unless otherwise documented below in the visit note. 

## 2013-09-19 ENCOUNTER — Telehealth: Payer: Self-pay | Admitting: Emergency Medicine

## 2013-09-19 ENCOUNTER — Ambulatory Visit (INDEPENDENT_AMBULATORY_CARE_PROVIDER_SITE_OTHER): Payer: Medicare Other | Admitting: Internal Medicine

## 2013-09-19 ENCOUNTER — Encounter (HOSPITAL_COMMUNITY): Payer: Self-pay | Admitting: Emergency Medicine

## 2013-09-19 ENCOUNTER — Other Ambulatory Visit: Payer: Self-pay

## 2013-09-19 ENCOUNTER — Inpatient Hospital Stay (HOSPITAL_COMMUNITY): Payer: Medicare Other

## 2013-09-19 ENCOUNTER — Inpatient Hospital Stay (HOSPITAL_COMMUNITY)
Admission: EM | Admit: 2013-09-19 | Discharge: 2013-09-21 | DRG: 186 | Disposition: A | Discharge status: Home / Self Care | Payer: Medicare Other | Attending: Internal Medicine | Admitting: Internal Medicine

## 2013-09-19 ENCOUNTER — Encounter: Payer: Self-pay | Admitting: Internal Medicine

## 2013-09-19 ENCOUNTER — Telehealth: Payer: Self-pay | Admitting: Family Medicine

## 2013-09-19 ENCOUNTER — Emergency Department (HOSPITAL_COMMUNITY): Payer: Medicare Other

## 2013-09-19 VITALS — BP 132/84 | HR 62 | Temp 98.0°F | Wt 173.0 lb

## 2013-09-19 DIAGNOSIS — J189 Pneumonia, unspecified organism: Secondary | ICD-10-CM

## 2013-09-19 DIAGNOSIS — Z800 Family history of malignant neoplasm of digestive organs: Secondary | ICD-10-CM

## 2013-09-19 DIAGNOSIS — J129 Viral pneumonia, unspecified: Secondary | ICD-10-CM

## 2013-09-19 DIAGNOSIS — M1990 Unspecified osteoarthritis, unspecified site: Secondary | ICD-10-CM

## 2013-09-19 DIAGNOSIS — R972 Elevated prostate specific antigen [PSA]: Secondary | ICD-10-CM | POA: Diagnosis present

## 2013-09-19 DIAGNOSIS — F411 Generalized anxiety disorder: Secondary | ICD-10-CM | POA: Diagnosis present

## 2013-09-19 DIAGNOSIS — R198 Other specified symptoms and signs involving the digestive system and abdomen: Secondary | ICD-10-CM | POA: Diagnosis present

## 2013-09-19 DIAGNOSIS — N400 Enlarged prostate without lower urinary tract symptoms: Secondary | ICD-10-CM | POA: Diagnosis present

## 2013-09-19 DIAGNOSIS — R3129 Other microscopic hematuria: Secondary | ICD-10-CM | POA: Diagnosis present

## 2013-09-19 DIAGNOSIS — H900 Conductive hearing loss, bilateral: Secondary | ICD-10-CM

## 2013-09-19 DIAGNOSIS — Z7982 Long term (current) use of aspirin: Secondary | ICD-10-CM

## 2013-09-19 DIAGNOSIS — K219 Gastro-esophageal reflux disease without esophagitis: Secondary | ICD-10-CM | POA: Diagnosis present

## 2013-09-19 DIAGNOSIS — Z8249 Family history of ischemic heart disease and other diseases of the circulatory system: Secondary | ICD-10-CM

## 2013-09-19 DIAGNOSIS — G609 Hereditary and idiopathic neuropathy, unspecified: Secondary | ICD-10-CM

## 2013-09-19 DIAGNOSIS — H8110 Benign paroxysmal vertigo, unspecified ear: Secondary | ICD-10-CM

## 2013-09-19 DIAGNOSIS — Z77090 Contact with and (suspected) exposure to asbestos: Secondary | ICD-10-CM

## 2013-09-19 DIAGNOSIS — J90 Pleural effusion, not elsewhere classified: Principal | ICD-10-CM | POA: Diagnosis present

## 2013-09-19 DIAGNOSIS — K5730 Diverticulosis of large intestine without perforation or abscess without bleeding: Secondary | ICD-10-CM | POA: Diagnosis present

## 2013-09-19 DIAGNOSIS — I10 Essential (primary) hypertension: Secondary | ICD-10-CM | POA: Diagnosis present

## 2013-09-19 DIAGNOSIS — E785 Hyperlipidemia, unspecified: Secondary | ICD-10-CM | POA: Diagnosis present

## 2013-09-19 DIAGNOSIS — R319 Hematuria, unspecified: Secondary | ICD-10-CM

## 2013-09-19 LAB — BASIC METABOLIC PANEL
BUN: 21 mg/dL (ref 6–23)
CO2: 27 mEq/L (ref 19–32)
CREATININE: 1.14 mg/dL (ref 0.50–1.35)
Calcium: 9.4 mg/dL (ref 8.4–10.5)
Chloride: 101 mEq/L (ref 96–112)
GFR, EST AFRICAN AMERICAN: 70 mL/min — AB (ref >90)
GFR, EST NON AFRICAN AMERICAN: 60 mL/min — AB (ref >90)
Glucose, Bld: 115 mg/dL — ABNORMAL HIGH (ref 70–99)
POTASSIUM: 4 meq/L (ref 3.7–5.3)
Sodium: 138 mEq/L (ref 137–147)

## 2013-09-19 LAB — I-STAT TROPONIN, ED: Troponin i, poc: 0 ng/mL (ref 0.00–0.08)

## 2013-09-19 LAB — CBC
HCT: 46.9 % (ref 39.0–52.0)
Hemoglobin: 16 g/dL (ref 13.0–17.0)
MCH: 31.3 pg (ref 26.0–34.0)
MCHC: 34.1 g/dL (ref 30.0–36.0)
MCV: 91.8 fL (ref 78.0–100.0)
Platelets: 152 10*3/uL (ref 150–400)
RBC: 5.11 MIL/uL (ref 4.22–5.81)
RDW: 14.5 % (ref 11.5–15.5)
WBC: 7.2 10*3/uL (ref 4.0–10.5)

## 2013-09-19 LAB — PRO B NATRIURETIC PEPTIDE: PRO B NATRI PEPTIDE: 108.3 pg/mL (ref 0–450)

## 2013-09-19 MED ORDER — SODIUM CHLORIDE 0.9 % IV SOLN
INTRAVENOUS | Status: DC
  Administered 2013-09-19: 23:00:00 via INTRAVENOUS

## 2013-09-19 MED ORDER — ACETAMINOPHEN 325 MG PO TABS
650.0000 mg | ORAL_TABLET | Freq: Four times a day (QID) | ORAL | Status: DC | PRN
  Administered 2013-09-20: 650 mg via ORAL
  Filled 2013-09-19: qty 2

## 2013-09-19 MED ORDER — DEXTROSE 5 % IV SOLN
500.0000 mg | INTRAVENOUS | Status: DC
  Administered 2013-09-20: 500 mg via INTRAVENOUS
  Filled 2013-09-19 (×2): qty 500

## 2013-09-19 MED ORDER — ASPIRIN 81 MG PO TABS: 81.0000 mg | ORAL_TABLET | Freq: Every day | ORAL | Status: DC

## 2013-09-19 MED ORDER — TERAZOSIN HCL 5 MG PO CAPS
10.0000 mg | ORAL_CAPSULE | Freq: Every day | ORAL | Status: DC
  Administered 2013-09-20: 10 mg via ORAL
  Filled 2013-09-19 (×3): qty 2

## 2013-09-19 MED ORDER — ONDANSETRON HCL 4 MG/2ML IJ SOLN: 4.0000 mg | Freq: Four times a day (QID) | INTRAMUSCULAR | Status: DC | PRN

## 2013-09-19 MED ORDER — OMEGA-3-ACID ETHYL ESTERS 1 G PO CAPS
2.0000 g | ORAL_CAPSULE | Freq: Two times a day (BID) | ORAL | Status: DC
  Administered 2013-09-21: 2 g via ORAL
  Filled 2013-09-19 (×5): qty 2

## 2013-09-19 MED ORDER — ONDANSETRON HCL 4 MG PO TABS: 4.0000 mg | ORAL_TABLET | Freq: Four times a day (QID) | ORAL | Status: DC | PRN

## 2013-09-19 MED ORDER — ATENOLOL 25 MG PO TABS
25.0000 mg | ORAL_TABLET | Freq: Every day | ORAL | Status: DC
  Administered 2013-09-20 – 2013-09-21 (×2): 25 mg via ORAL
  Filled 2013-09-19 (×2): qty 1

## 2013-09-19 MED ORDER — OMEGA-3 FATTY ACIDS 1000 MG PO CAPS: 2.0000 g | ORAL_CAPSULE | Freq: Two times a day (BID) | ORAL | Status: DC

## 2013-09-19 MED ORDER — PANTOPRAZOLE SODIUM 40 MG PO TBEC
40.0000 mg | DELAYED_RELEASE_TABLET | Freq: Every day | ORAL | Status: DC
  Administered 2013-09-20 – 2013-09-21 (×2): 40 mg via ORAL
  Filled 2013-09-19 (×2): qty 1

## 2013-09-19 MED ORDER — SIMVASTATIN 20 MG PO TABS
20.0000 mg | ORAL_TABLET | Freq: Every day | ORAL | Status: DC
  Administered 2013-09-20: 20 mg via ORAL
  Filled 2013-09-19 (×3): qty 1

## 2013-09-19 MED ORDER — HYDROCODONE-HOMATROPINE 5-1.5 MG/5ML PO SYRP
5.0000 mL | ORAL_SOLUTION | Freq: Three times a day (TID) | ORAL | Status: DC | PRN
  Administered 2013-09-20: 5 mL via ORAL
  Filled 2013-09-19: qty 5

## 2013-09-19 MED ORDER — DEXTROSE 5 % IV SOLN
1.0000 g | Freq: Once | INTRAVENOUS | Status: AC
  Administered 2013-09-19: 1 g via INTRAVENOUS
  Filled 2013-09-19: qty 10

## 2013-09-19 MED ORDER — AZITHROMYCIN 500 MG IV SOLR
500.0000 mg | Freq: Once | INTRAVENOUS | Status: AC
  Administered 2013-09-19: 500 mg via INTRAVENOUS

## 2013-09-19 MED ORDER — DEXTROSE 5 % IV SOLN
1.0000 g | INTRAVENOUS | Status: DC
  Administered 2013-09-20: 1 g via INTRAVENOUS
  Filled 2013-09-19 (×2): qty 10

## 2013-09-19 MED ORDER — FINASTERIDE 5 MG PO TABS
5.0000 mg | ORAL_TABLET | Freq: Every day | ORAL | Status: DC
  Administered 2013-09-20 – 2013-09-21 (×2): 5 mg via ORAL
  Filled 2013-09-19 (×2): qty 1

## 2013-09-19 MED ORDER — ACETAMINOPHEN 650 MG RE SUPP: 650.0000 mg | Freq: Four times a day (QID) | RECTAL | Status: DC | PRN

## 2013-09-19 MED ORDER — ASPIRIN EC 81 MG PO TBEC
81.0000 mg | DELAYED_RELEASE_TABLET | Freq: Every day | ORAL | Status: DC
  Filled 2013-09-19: qty 1

## 2013-09-19 NOTE — Assessment & Plan Note (Signed)
80 year old white male presents with failed treatment of community-acquired pneumonia with worsening right pleural effusion. He was treated with doxycycline 100 mg twice daily for 10 days along with prednisone taper. On exam he has dullness to percussion up to T3-4. Unclear whether patient has parapneumonic effusion versus other etiology. I recommended hospitalization for further evaluation and treatment. He will likely need therapeutic and diagnostic thoracentesis.  Obtain CBCD, comprehensive metabolic panel, BNP and repeat chest x-ray. Defer to hospitalist whether patient needs additional IV antibiotics.  Consult [place] [facility].  He was scheduled to see Dr. Lamonte Sakai 09/20/13.  Once thoracentesis performed, consider CT of Chest with IV contrast.  Contacted [place] physician and discussed patient's case in detail.

## 2013-09-19 NOTE — ED Notes (Signed)
Dr. Wyvonnia Dusky (hospitalist) at the bedside.

## 2013-09-19 NOTE — ED Notes (Signed)
To ed from home, was sent here from private MD-- for thoracentesis, being treated for pneumonia for past few days without any response. Sent here for admission

## 2013-09-19 NOTE — Telephone Encounter (Signed)
Spoke with the pt's spouse  She states that she is concerned that pt can not wait until his consult with RB tomorrow  She states that he is having increased SOB, "color is not right" and having pain across his chest  I advised that we have no openings today, and she needs to take him to ED We can not give any other recs since we have never seen the pt  She verbalized understanding

## 2013-09-19 NOTE — ED Provider Notes (Signed)
CSN: [id number]     Arrival date & time 09/19/13  1736 History   First MD Initiated Contact with Patient 09/19/13 1934     Chief Complaint  Patient presents with  . Pneumonia  . Shortness of Breath     (Consider location/radiation/quality/duration/timing/severity/associated sxs/prior Treatment) HPI Comments: Patient presents from PCPs office with 3 week history of shortness of breath, cough and congestion. He was treated for pneumonia with doxycycline. He was also placed on a steroid taper and given cough medication. Followup visit today he was found to have enlarging right-sided pleural effusion. He denies any significant chest pain. Denies any dyspnea with exertion but is worse with lying flat. Denies abdominal pain, nausea, vomiting or fever. He denies any cardiac or pulmonary history.  The history is provided by the patient and the spouse.    Past Medical History  Diagnosis Date  . Personal history of contact with and (suspected) exposure to asbestos   . Unspecified essential hypertension   . Other and unspecified hyperlipidemia   . Esophageal reflux   . Diverticulosis of colon (without mention of hemorrhage)   . Elevated prostate specific antigen (PSA)   . Osteoarthrosis, unspecified whether generalized or localized, unspecified site   . Anxiety state, unspecified   . Rectal mass    Past Surgical History  Procedure Laterality Date  . Inguinal hernia repair      left   Family History  Problem Relation Age of Onset  . Coronary artery disease Father   . Coronary artery disease Mother   . Heart attack Brother   . Colon cancer Father     ? not exactly sure   History  Substance Use Topics  . Smoking status: Never Smoker   . Smokeless tobacco: Not on file  . Alcohol Use: No    Review of Systems  Constitutional: Negative for fever, activity change and appetite change.  HENT: Negative for congestion and rhinorrhea.   Respiratory: Positive for cough and shortness of  breath.   Cardiovascular: Negative for chest pain.  Gastrointestinal: Negative for nausea, vomiting and abdominal pain.  Genitourinary: Negative for dysuria, hematuria and testicular pain.  Musculoskeletal: Negative for arthralgias, back pain and myalgias.  Skin: Negative for rash.  Neurological: Negative for dizziness, weakness and headaches.  A complete 10 system review of systems was obtained and all systems are negative except as noted in the HPI and PMH.      Allergies  Review of patient's allergies indicates no known allergies.  Home Medications   Prior to Admission medications   Medication Sig Start Date End Date Taking? Authorizing Provider  aspirin 81 MG tablet Take 81 mg by mouth daily.      Historical Provider, MD  atenolol (TENORMIN) 25 MG tablet TAKE ONE TABLET BY MOUTH DAILY 01/22/13   Dorena Cookey, MD  doxycycline (VIBRA-TABS) 100 MG tablet Take 1 tablet (100 mg total) by mouth 2 (two) times daily. 09/02/13   Dorena Cookey, MD  EPINEPHrine (EPIPEN 2-PAK) 0.3 mg/0.3 mL DEVI Inject 0.3 mLs (0.3 mg total) into the muscle once. 10/10/12   Dorena Cookey, MD  esomeprazole (NEXIUM) 20 MG capsule Take 20 mg by mouth daily at 12 noon.    Historical Provider, MD  finasteride (PROSCAR) 5 MG tablet Take 1 tablet (5 mg total) by mouth daily. 10/10/12   Dorena Cookey, MD  fish oil-omega-3 fatty acids 1000 MG capsule Take 2 g by mouth 2 (two) times daily.  Historical Provider, MD  glucosamine-chondroitin 500-400 MG tablet Take 1 tablet by mouth 2 (two) times daily.     Historical Provider, MD  HYDROcodone-homatropine (HYCODAN) 5-1.5 MG/5ML syrup Take 5 mLs by mouth every 8 (eight) hours as needed for cough. 09/02/13   Dorena Cookey, MD  multivitamin [facility]) per tablet Take 1 tablet by mouth daily.      Historical Provider, MD  pravastatin (PRAVACHOL) 40 MG tablet Take 1 tablet (40 mg total) by mouth every other day. 10/10/12   Dorena Cookey, MD  predniSONE (DELTASONE) 20 MG tablet  2 tabs x 3 days, 1 tab x 3 days, 1/2 tab x 3 days, 1/2 tab M,W,F x 2 weeks 09/02/13   Dorena Cookey, MD  terazosin (HYTRIN) 10 MG capsule Take 1 capsule (10 mg total) by mouth at bedtime. 10/10/12   Dorena Cookey, MD   BP 120/60  Pulse 60  Temp(Src) 97.9 F (36.6 C) (Oral)  Resp 18  Ht 5\' 8"  (1.727 m)  Wt 170 lb 6.7 oz (77.3 kg)  BMI 25.92 kg/m2  SpO2 94% Physical Exam  Constitutional: He is oriented to person, place, and time. He appears well-developed and well-nourished. No distress.  HENT:  Head: Normocephalic and atraumatic.  Mouth/Throat: Oropharynx is clear and moist. No oropharyngeal exudate.  Eyes: Conjunctivae and EOM are normal. Pupils are equal, round, and reactive to light.  Neck: Normal range of motion. Neck supple.  Cardiovascular: Normal rate, regular rhythm and normal heart sounds.   Pulmonary/Chest: Effort normal and breath sounds normal. No respiratory distress. He has no wheezes.  Decreased breath sounds mid way up R lung.  Abdominal: Soft. There is no tenderness. There is no rebound and no guarding.  Musculoskeletal: Normal range of motion. He exhibits no edema and no tenderness.  Neurological: He is alert and oriented to person, place, and time. No cranial nerve deficit. He exhibits normal muscle tone. Coordination normal.  Skin: Skin is warm.    ED Course  Procedures (including critical care time) Labs Review Labs Reviewed  BASIC METABOLIC PANEL - Abnormal; Notable for the following:    Glucose, Bld 115 (*)    GFR calc non Af Amer 60 (*)    GFR calc Af Amer 70 (*)    All other components within normal limits  BASIC METABOLIC PANEL - Abnormal; Notable for the following:    GFR calc non Af Amer 64 (*)    GFR calc Af Amer 74 (*)    All other components within normal limits  URINALYSIS, ROUTINE W REFLEX MICROSCOPIC - Abnormal; Notable for the following:    Hgb urine dipstick LARGE (*)    All other components within normal limits  CULTURE, BLOOD (ROUTINE X  2)  CULTURE, BLOOD (ROUTINE X 2)  BODY FLUID CULTURE  URINE CULTURE  CBC  PRO B NATRIURETIC PEPTIDE  STREP PNEUMONIAE URINARY ANTIGEN  CBC  URINE MICROSCOPIC-ADD ON  LEGIONELLA ANTIGEN, URINE  LACTATE DEHYDROGENASE, BODY FLUID  LACTATE DEHYDROGENASE  I-STAT TROPOININ, ED  CYTOLOGY - NON PAP    Imaging Review Dg Chest 2 View  09/19/2013   CLINICAL DATA:  PNEUMONIA SHORTNESS OF BREATH  EXAM: CHEST  2 VIEW  COMPARISON:  DG CHEST 2 VIEW dated 09/17/2013  FINDINGS: Cardiac silhouette is partially obscured by the stable moderate right pleural effusion. Stable right lower lobe infiltrate. Left lung is clear. No acute osseous abnormalities.  IMPRESSION: Stable right pleural effusion and right lower lobe infiltrate.   Electronically  Signed   By: Margaree Mackintosh M.D.   On: 09/19/2013 18:45   Ct Chest Wo Contrast  09/19/2013   CLINICAL DATA:  Shortness of breath. Clinical diagnosis of pneumonia.  EXAM: CT CHEST WITHOUT CONTRAST  TECHNIQUE: Multidetector CT imaging of the chest was performed following the standard protocol without IV contrast.  COMPARISON:  Chest radiographs obtained earlier today.  FINDINGS: Large right pleural effusion. Compressive atelectasis of the adjacent portions of the right lung. No visible pleural thickening or pleural based masses. Atheromatous coronary artery calcifications. Minimal linear atelectasis or scarring at the left lung base. No lung nodules or enlarged lymph nodes seen.  Partially included left renal cysts. 2.1 x 1.8 cm exophytic medial upper right renal mass measuring 38 Hounsfield units in density on image number 65. Mild thoracic spine degenerative changes.  IMPRESSION: 1. Large right pleural effusion. 2. Compressive atelectasis of the right lung, most pronounced involving the lower lobe. 3. Atheromatous coronary artery calcifications. 4. 2.1 cm exophytic right renal complex cyst or solid mass. If the patient's renal function allows it, elective pre and postcontrast  CT or magnetic resonance imaging of the kidneys would be recommended.   Electronically Signed   By: Enrique Sack M.D.   On: 09/19/2013 21:54     EKG Interpretation   Date/Time:  Thursday Sep 19 2013 17:41:32 EDT Ventricular Rate:  67 PR Interval:  128 QRS Duration: 80 QT Interval:  388 QTC Calculation: 409 R Axis:   73 Text Interpretation:  Normal sinus rhythm Normal ECG No previous ECGs  available Confirmed by Wyvonnia Dusky  MD, Weylyn Ricciuti [phone]) on 09/19/2013 7:54:04  PM      MDM   Final diagnoses:  Pleural effusion   Patient from PCPs office with worsening shortness of breath and pleural effusion. No chest pain, fever or vomiting.  Chest x-ray shows large right-sided pleural effusion. Patient dyspneic with exertion and desaturates to 90%.  Will plan admission for thoracentesis per PCP recommendations.  Continue empiric antibiotics for CAP.  Admission d/w Dr. Hal Hope who requests CT chest which was ordered and pending at time of admission.    Ezequiel Essex, MD 09/20/13 1044

## 2013-09-19 NOTE — Progress Notes (Signed)
Called ED for report. Secretary stated pt's RN would call back.

## 2013-09-19 NOTE — Telephone Encounter (Signed)
Patient Information:  Caller Name: [name]  Phone: [phone]  Patient: Sean Mitchell, Sean Mitchell  Gender: Male  DOB: Aug 06, 1933  Age: 80 Years  PCP: Stevie Kern [facility])  Office Follow Up:  Does the office need to follow up with this patient?: Yes  Instructions For The Office: Pls see RN note.  RN Note:  ER CALL. F/U Pneumonia, onset 5-11. Pt finished Doxycycline.  Pt is getting worse per Wife, SOB worsen, Pt having to take deep breath, "feels Chest is filling up" discomfort in Chest w/ Cough and ashen color, new onset, continues to have constant dry Cough. Cardiomegaly seen on 2nd xray, new diagnosis.  Pt has appt w/ Pulmonologist on 5-22 at 1330 per Dr Honor Junes advice on 5-19 OV.  Wife denies ir-regular heartbeat or rapid breathing.  Wife doesn't wish to go to ED.  All emergent sxs ruled out per Cough protocol in [facility], see in 8 hrs d/t moderate pain occurring w/ cough/deep breath for one or more day.  Appt offered w/ other [place] location, Wife would rather come to [place] office and have MD evaluate before going to ED.  Encourage Wife to have Pt seen asap.  Please review Pt's sxs w/ MD and f/u w/ Wife.   Symptoms  Reason For Call & Symptoms: ER CALL. F/U Pneumonia, onset 5-11.  Reviewed Health History In EMR: N/A  Reviewed Medications In EMR: N/A  Reviewed Allergies In EMR: N/A  Reviewed Surgeries / Procedures: N/A  Date of Onset of Symptoms: 09/09/2013  Treatments Tried: Doxycycline on 5-11.  Treatments Tried Worked: No  Guideline(s) Used:  No Protocol Available - Sick Adult  Disposition Per Guideline:   Go to Office Now  Reason For Disposition Reached:   Nursing judgment  Advice Given:  N/A  Patient Will Follow Care Advice:  YES

## 2013-09-19 NOTE — Assessment & Plan Note (Signed)
He has enlarging right pleural effusion.  Parapneumonic vs other.  He will require therapeutic and diagnostic thoracentesis.

## 2013-09-19 NOTE — Assessment & Plan Note (Signed)
Stable on atenolol 25 mg.  BP: 132/84 mmHg

## 2013-09-19 NOTE — Telephone Encounter (Signed)
I called Sean Mitchell and advised them Dr. Shawna Orleans is going to see them, they are on the way.

## 2013-09-19 NOTE — Progress Notes (Signed)
Subjective:    Patient ID: Sean Mould., male    DOB: Apr 20, 1934, 80 y.o.   MRN: [id number]  HPI  80 year old white male with seen by his PCP for right lower lobe pneumonia with moderate to large pleural effusion presents with progressive shortness of breath/dyspnea.  He was initially seen on 09/02/2013 secondary to severe upper respiratory symptoms. Patient presumed to have possible pneumonia and started on doxycycline 100 mg twice daily. Patient continued to deteriorate and experienced shortness of breath and was seen by Dr. Maudie Mercury on 09/09/2013. Patient was wheezing on exam and prednisone taper was added. Due to worsening symptoms chest x-ray was obtained on 09/17/2013 which showed right lower lobe infiltrate consistent with pneumonia and associated moderate right-sided pleural effusion.  Over the last 2 or 3 days patient reports his symptoms progressively getting worse. He has dyspnea with laying down. He also has pain in his right back and right shoulder. He denies fever or chills. He continues with intermittent cough.  Patient has not been a smoker and has no history of COPD. Prior to his recent illness he was in his usual state of health. He denies any issues with poor appetite or weight loss.  He denies cardiac history.  Patient reports he usually exercises on a regular basis (walking 2-3 miles) without difficulty.  Review of Systems Intermittent cough, no fevers, rigor or chills, orthopnea    Past Medical History  Diagnosis Date  . Personal history of contact with and (suspected) exposure to asbestos   . Unspecified essential hypertension   . Other and unspecified hyperlipidemia   . Esophageal reflux   . Diverticulosis of colon (without mention of hemorrhage)   . Elevated prostate specific antigen (PSA)   . Osteoarthrosis, unspecified whether generalized or localized, unspecified site   . Anxiety state, unspecified   . Rectal mass     History   Social History  . Marital  Status: Married    Spouse Name: N/A    Number of Children: 2  . Years of Education: N/A   Occupational History  . retired    Social History Main Topics  . Smoking status: Never Smoker   . Smokeless tobacco: Not on file  . Alcohol Use: No  . Drug Use: No  . Sexual Activity: Not on file   Other Topics Concern  . Not on file   Social History Narrative  . No narrative on file    Past Surgical History  Procedure Laterality Date  . Inguinal hernia repair      left    Family History  Problem Relation Age of Onset  . Coronary artery disease Father   . Coronary artery disease Mother   . Heart attack Brother   . Colon cancer Father     ? not exactly sure    No Known Allergies  Current Outpatient Prescriptions on File Prior to Visit  Medication Sig Dispense Refill  . aspirin 81 MG tablet Take 81 mg by mouth daily.        Marland Kitchen atenolol (TENORMIN) 25 MG tablet TAKE ONE TABLET BY MOUTH DAILY  90 tablet  3  . doxycycline (VIBRA-TABS) 100 MG tablet Take 1 tablet (100 mg total) by mouth 2 (two) times daily.  20 tablet  1  . EPINEPHrine (EPIPEN 2-PAK) 0.3 mg/0.3 mL DEVI Inject 0.3 mLs (0.3 mg total) into the muscle once.  1 Device  1  . esomeprazole (NEXIUM) 20 MG capsule Take 20 mg by mouth  daily at 12 noon.      . finasteride (PROSCAR) 5 MG tablet Take 1 tablet (5 mg total) by mouth daily.  100 tablet  3  . fish oil-omega-3 fatty acids 1000 MG capsule Take 2 g by mouth 2 (two) times daily.       Marland Kitchen glucosamine-chondroitin 500-400 MG tablet Take 1 tablet by mouth 2 (two) times daily.       Marland Kitchen HYDROcodone-homatropine (HYCODAN) 5-1.5 MG/5ML syrup Take 5 mLs by mouth every 8 (eight) hours as needed for cough.  240 mL  0  . multivitamin (THERAGRAN) per tablet Take 1 tablet by mouth daily.        . pravastatin (PRAVACHOL) 40 MG tablet Take 1 tablet (40 mg total) by mouth every other day.  100 tablet  3  . predniSONE (DELTASONE) 20 MG tablet 2 tabs x 3 days, 1 tab x 3 days, 1/2 tab x 3 days,  1/2 tab M,W,F x 2 weeks  40 tablet  1  . terazosin (HYTRIN) 10 MG capsule Take 1 capsule (10 mg total) by mouth at bedtime.  100 capsule  3   No current facility-administered medications on file prior to visit.    BP 132/84  Pulse 62  Temp(Src) 98 F (36.7 C) (Oral)  Wt 173 lb (78.472 kg)  SpO2 92%    Objective:   Physical Exam  Constitutional: He is oriented to person, place, and time. He appears well-developed and well-nourished. No distress.  HENT:  Head: Normocephalic and atraumatic.  Right Ear: External ear normal.  Left Ear: External ear normal.  Mouth/Throat: Oropharynx is clear and moist.  Neck: Neck supple.  Cardiovascular: Normal rate, regular rhythm and normal heart sounds.   No murmur heard. Pulmonary/Chest: Effort normal.  Decreased breath sounds right lung field,  Dullness to percussion up to T4-5  Abdominal: Soft. Bowel sounds are normal. There is no tenderness.  Musculoskeletal: He exhibits no edema.  Lymphadenopathy:    He has no cervical adenopathy.  Neurological: He is alert and oriented to person, place, and time. No cranial nerve deficit.  Skin: Skin is warm and dry.  Psychiatric: He has a normal mood and affect. His behavior is normal.          Assessment & Plan:

## 2013-09-19 NOTE — Progress Notes (Signed)
Pre visit review using our clinic review tool, if applicable. No additional management support is needed unless otherwise documented below in the visit note. 

## 2013-09-19 NOTE — ED Notes (Signed)
Dr. Wyvonnia Dusky at the bedside.

## 2013-09-19 NOTE — Patient Instructions (Signed)
Proceed to the [facility] emergency room as directed for admission for right lower lobe pneumonia with large right pleural effusion.

## 2013-09-19 NOTE — H&P (Signed)
[facility] History and Physical  Sean Mitchell. XIP:[id number] DOB: Feb 20, 1934 DOA: 09/19/2013  Referring physician: ER physician. PCP: [name], MD   Chief Complaint: Shortness of breath.  HPI: Sean Mitchell. is a 80 y.o. male with history of hypertension and hyperlipidemia has been experiencing nonproductive cough with shortness of breath since May 4. Patient was empirically treated with doxycycline for pneumonia. Patient's symptoms has slowly worsened and eventual chest x-ray showed right-sided pleural effusion and patient's PCP had made appointment with pulmonologist Dr. Lamonte Sakai on May 22. Patient also was found to be wheezing and was placed on prednisone taper dose. Patient's shortness of breath gradually worsened and had gone to his PCP today and was referred to the ER for further workup. Chest x-ray shows right-sided pleural effusion and infiltrates. Patient has been placed on ceftriaxone and Zithromax and CT chest without contrast has been ordered to check for any loculation. Patient otherwise denies any nausea vomiting abdominal pain diarrhea. Patient has at times pain in the right posterior aspect of the chest particularly on deep inspiration.   Review of Systems: As presented in the history of presenting illness, rest negative.  Past Medical History  Diagnosis Date  . Personal history of contact with and (suspected) exposure to asbestos   . Unspecified essential hypertension   . Other and unspecified hyperlipidemia   . Esophageal reflux   . Diverticulosis of colon (without mention of hemorrhage)   . Elevated prostate specific antigen (PSA)   . Osteoarthrosis, unspecified whether generalized or localized, unspecified site   . Anxiety state, unspecified   . Rectal mass    Past Surgical History  Procedure Laterality Date  . Inguinal hernia repair      left   Social History:  reports that he has never smoked. He does not have any smokeless tobacco history  on file. He reports that he does not drink alcohol or use illicit drugs. Where does patient live home. Can patient participate in ADLs? Yes.  No Known Allergies  Family History:  Family History  Problem Relation Age of Onset  . Coronary artery disease Father   . Coronary artery disease Mother   . Heart attack Brother   . Colon cancer Father     ? not exactly sure      Prior to Admission medications   Medication Sig Start Date End Date Taking? Authorizing Provider  aspirin 81 MG tablet Take 81 mg by mouth daily.     Yes Historical Provider, MD  atenolol (TENORMIN) 25 MG tablet Take 25 mg by mouth daily.   Yes Historical Provider, MD  esomeprazole (NEXIUM) 20 MG capsule Take 20 mg by mouth daily at 12 noon.   Yes Historical Provider, MD  finasteride (PROSCAR) 5 MG tablet Take 1 tablet (5 mg total) by mouth daily. 10/10/12  Yes Dorena Cookey, MD  fish oil-omega-3 fatty acids 1000 MG capsule Take 2 g by mouth 2 (two) times daily.    Yes Historical Provider, MD  glucosamine-chondroitin 500-400 MG tablet Take 1 tablet by mouth 2 (two) times daily.    Yes Historical Provider, MD  HYDROcodone-homatropine (HYCODAN) 5-1.5 MG/5ML syrup Take 5 mLs by mouth every 8 (eight) hours as needed for cough. 09/02/13  Yes Dorena Cookey, MD  ibuprofen (ADVIL,MOTRIN) 200 MG tablet Take 400 mg by mouth every 8 (eight) hours as needed for moderate pain.   Yes Historical Provider, MD  multivitamin [facility]) per tablet Take 1 tablet by mouth daily.  Yes Historical Provider, MD  naproxen sodium (ANAPROX) 220 MG tablet Take 220 mg by mouth daily as needed (for pain).   Yes Historical Provider, MD  pravastatin (PRAVACHOL) 40 MG tablet Take 1 tablet (40 mg total) by mouth every other day. 10/10/12  Yes Dorena Cookey, MD  predniSONE (DELTASONE) 20 MG tablet 2 tabs x 3 days, 1 tab x 3 days, 1/2 tab x 3 days, 1/2 tab M,W,F x 2 weeks 09/02/13  Yes Dorena Cookey, MD  terazosin (HYTRIN) 10 MG capsule Take 1 capsule (10  mg total) by mouth at bedtime. 10/10/12  Yes Dorena Cookey, MD  EPINEPHrine (EPIPEN 2-PAK) 0.3 mg/0.3 mL DEVI Inject 0.3 mLs (0.3 mg total) into the muscle once. 10/10/12   Dorena Cookey, MD    Physical Exam: Filed Vitals:   09/19/13 2015 09/19/13 2030 09/19/13 2100 09/19/13 2115  BP: 132/73 142/74 138/85 133/79  Pulse: 54 55 58 58  Temp:      TempSrc:      Resp:  21 15 16   Height:      Weight:      SpO2: 95% 94% 96% 95%     General:  Well-developed and nourished.  Eyes: Anicteric no pallor.  ENT: No discharge from the ears eyes nose mouth.  Neck: No mass felt.  Cardiovascular: S1-S2 heard.  Respiratory: No rhonchi or crepitations.  Abdomen: Soft nontender bowel sounds present. No guarding or rigidity.  Skin: No rash.  Musculoskeletal: No edema.  Psychiatric: Appears normal.  Neurologic: Alert awake oriented to time place and person. Moves all extremities.  Labs on Admission:  Basic Metabolic Panel:  Recent Labs Lab 09/19/13 1755  NA 138  K 4.0  CL 101  CO2 27  GLUCOSE 115*  BUN 21  CREATININE 1.14  CALCIUM 9.4   Liver Function Tests: No results found for this basename: AST, ALT, ALKPHOS, BILITOT, PROT, ALBUMIN,  in the last 168 hours No results found for this basename: LIPASE, AMYLASE,  in the last 168 hours No results found for this basename: AMMONIA,  in the last 168 hours CBC:  Recent Labs Lab 09/19/13 1755  WBC 7.2  HGB 16.0  HCT 46.9  MCV 91.8  PLT 152   Cardiac Enzymes: No results found for this basename: CKTOTAL, CKMB, CKMBINDEX, TROPONINI,  in the last 168 hours  BNP (last 3 results)  Recent Labs  09/19/13 1755  PROBNP 108.3   CBG: No results found for this basename: GLUCAP,  in the last 168 hours  Radiological Exams on Admission: Dg Chest 2 View  09/19/2013   CLINICAL DATA:  PNEUMONIA SHORTNESS OF BREATH  EXAM: CHEST  2 VIEW  COMPARISON:  DG CHEST 2 VIEW dated 09/17/2013  FINDINGS: Cardiac silhouette is partially obscured  by the stable moderate right pleural effusion. Stable right lower lobe infiltrate. Left lung is clear. No acute osseous abnormalities.  IMPRESSION: Stable right pleural effusion and right lower lobe infiltrate.   Electronically Signed   By: Margaree Mackintosh M.D.   On: 09/19/2013 18:45     Assessment/Plan Active Problems:   HYPERLIPIDEMIA   HYPERTENSION   Pleural effusion   Pneumonia   1. Pleural effusion with pneumonia - CT chest has been ordered to check for any patient. I have consulted pulmonologist for possible thoracentesis. Patient has been placed on ceftriaxone and Zithromax. Check urine for Legionella and strep antigen. 2. Hypertension - continue present medications. 3. Hyperlipidemia - continue present medications.    Code  Status: Full code.  Family Communication: Family at the bedside.  Disposition Plan: Admit to inpatient.    [facility] Pager [phone]. If 7PM-7AM, please contact night-coverage [email or web address] Password [facility] 09/19/2013, 9:21 PM

## 2013-09-19 NOTE — Progress Notes (Signed)
ANTIBIOTIC CONSULT NOTE - INITIAL  Pharmacy Consult for Ceftriaxone Indication: pneumonia, community-acquired  No Known Allergies  Patient Measurements: Height: 5\' 8"  (172.7 cm) Weight: 172 lb (78.019 kg) IBW/kg (Calculated) : 68.4  Vital Signs: Temp: 97.6 F (36.4 C) (05/21 1746) Temp src: Oral (05/21 1746) BP: 125/71 mmHg (05/21 2143) Pulse Rate: 59 (05/21 2143)  Labs:  Recent Labs  09/19/13 1755  WBC 7.2  HGB 16.0  PLT 152  CREATININE 1.14   Estimated Creatinine Clearance: 52.5 ml/min (by C-G formula based on Cr of 1.14).  Microbiology:   5/21 - blood cultures x 2 sent  Medical History: Past Medical History  Diagnosis Date  . Personal history of contact with and (suspected) exposure to asbestos   . Unspecified essential hypertension   . Other and unspecified hyperlipidemia   . Esophageal reflux   . Diverticulosis of colon (without mention of hemorrhage)   . Elevated prostate specific antigen (PSA)   . Osteoarthrosis, unspecified whether generalized or localized, unspecified site   . Anxiety state, unspecified   . Rectal mass    Assessment:  Azithromycin 500 mg IV and Ceftriaxone 1 gram IV given in ED.  To continue both for CAP.  Goal of Therapy:  appropriate Ceftriaxone dose for renal function and infection  Plan:   Ceftriaxone 1 gram IV q24hrs.  Do not anticipate any need to adjust regimen.  Arty Baumgartner, [place] Pager: [phone] 09/19/2013,10:43 PM

## 2013-09-20 ENCOUNTER — Inpatient Hospital Stay (HOSPITAL_COMMUNITY): Payer: Medicare Other

## 2013-09-20 ENCOUNTER — Institutional Professional Consult (permissible substitution): Payer: Medicare Other | Admitting: Emergency Medicine

## 2013-09-20 DIAGNOSIS — I359 Nonrheumatic aortic valve disorder, unspecified: Secondary | ICD-10-CM

## 2013-09-20 DIAGNOSIS — R319 Hematuria, unspecified: Secondary | ICD-10-CM

## 2013-09-20 LAB — BODY FLUID CELL COUNT WITH DIFFERENTIAL
EOS FL: NONE SEEN %
Lymphs, Fluid: 66 %
Monocyte-Macrophage-Serous Fluid: 29 % — ABNORMAL LOW (ref 50–90)
NEUTROPHIL FLUID: 5 % (ref 0–25)
WBC FLUID: 448 uL (ref 0–1000)

## 2013-09-20 LAB — LACTATE DEHYDROGENASE
LDH: 250 U/L (ref 94–250)
LDH: 229 U/L (ref 94–250)

## 2013-09-20 LAB — LEGIONELLA ANTIGEN, URINE: Legionella Antigen, Urine: NEGATIVE

## 2013-09-20 LAB — URINE MICROSCOPIC-ADD ON

## 2013-09-20 LAB — CBC
HCT: 45.6 % (ref 39.0–52.0)
Hemoglobin: 15.3 g/dL (ref 13.0–17.0)
MCH: 31.2 pg (ref 26.0–34.0)
MCHC: 33.6 g/dL (ref 30.0–36.0)
MCV: 92.9 fL (ref 78.0–100.0)
PLATELETS: 157 10*3/uL (ref 150–400)
RBC: 4.91 MIL/uL (ref 4.22–5.81)
RDW: 14.6 % (ref 11.5–15.5)
WBC: 7.4 10*3/uL (ref 4.0–10.5)

## 2013-09-20 LAB — URINALYSIS, ROUTINE W REFLEX MICROSCOPIC
Bilirubin Urine: NEGATIVE
Glucose, UA: NEGATIVE mg/dL
KETONES UR: NEGATIVE mg/dL
Leukocytes, UA: NEGATIVE
Nitrite: NEGATIVE
Protein, ur: NEGATIVE mg/dL
SPECIFIC GRAVITY, URINE: 1.008 (ref 1.005–1.030)
Urobilinogen, UA: 0.2 mg/dL (ref 0.0–1.0)
pH: 7.5 (ref 5.0–8.0)

## 2013-09-20 LAB — BASIC METABOLIC PANEL
BUN: 19 mg/dL (ref 6–23)
CALCIUM: 8.8 mg/dL (ref 8.4–10.5)
CO2: 27 mEq/L (ref 19–32)
Chloride: 102 mEq/L (ref 96–112)
Creatinine, Ser: 1.08 mg/dL (ref 0.50–1.35)
GFR calc non Af Amer: 64 mL/min — ABNORMAL LOW (ref >90)
GFR, EST AFRICAN AMERICAN: 74 mL/min — AB (ref >90)
Glucose, Bld: 97 mg/dL (ref 70–99)
Potassium: 4.2 mEq/L (ref 3.7–5.3)
SODIUM: 138 meq/L (ref 137–147)

## 2013-09-20 LAB — PROTEIN, TOTAL: Total Protein: 6.7 g/dL (ref 6.0–8.3)

## 2013-09-20 LAB — LACTATE DEHYDROGENASE, PLEURAL OR PERITONEAL FLUID: LD, Fluid: 204 U/L — ABNORMAL HIGH (ref 3–23)

## 2013-09-20 LAB — GLUCOSE, SEROUS FLUID: Glucose, Fluid: 90 mg/dL

## 2013-09-20 LAB — PROTEIN, BODY FLUID: TOTAL PROTEIN, FLUID: 3.3 g/dL

## 2013-09-20 LAB — STREP PNEUMONIAE URINARY ANTIGEN: STREP PNEUMO URINARY ANTIGEN: NEGATIVE

## 2013-09-20 MED ORDER — ZOLPIDEM TARTRATE 5 MG PO TABS: 5.0000 mg | ORAL_TABLET | Freq: Every evening | ORAL | Status: DC | PRN

## 2013-09-20 MED ORDER — DIPHENHYDRAMINE HCL 25 MG PO CAPS: 25.0000 mg | ORAL_CAPSULE | Freq: Every evening | ORAL | Status: DC | PRN

## 2013-09-20 NOTE — Progress Notes (Signed)
PATIENT DETAILS Name: Sean Mitchell. Age: 80 y.o. Sex: male Date of Birth: April 27, 1934 Admit Date: 09/19/2013 Admitting Physician Rise Patience, MD [id number] [name], MD  Subjective: No major issues overngiht  Assessment/Plan: Active Problems: Right Sided Pleural Effusion -etiology not known-for diagnostic thoracocentesis today-further investigations depending on exudate or transudate -do not think he needs antibiotics at this point  Hematuria -CT Chest shows 2.1 cm exophytic right renal complex cyst or solid mass-?leading to above-if malignant -check MRI Abdomen-depending on results-Urology consult -check UA -monitor for now -stop ASA    HYPERLIPIDEMIA -c/w Statin    HYPERTENSION -stable with Atenolol  BPH -c/w Flomax and Proscar -has had prior prostate surgery and ?Radiation   Disposition: Remain inpatient  DVT Prophylaxis: SCD's  Code Status: Full code  Family Communication Spouse  Procedures:  None  CONSULTS:  pulmonary/intensive care  Time spent 40 minutes-which includes 50% of the time with face-to-face with patient/ family and coordinating care related to the above assessment and plan.    MEDICATIONS: Scheduled Meds: . aspirin EC  81 mg Oral Daily  . atenolol  25 mg Oral Daily  . azithromycin  500 mg Intravenous Q24H  . cefTRIAXone (ROCEPHIN)  IV  1 g Intravenous Q24H  . finasteride  5 mg Oral Daily  . omega-3 acid ethyl esters  2 g Oral BID  . pantoprazole  40 mg Oral Daily  . simvastatin  20 mg Oral q1800  . terazosin  10 mg Oral QHS   Continuous Infusions: . sodium chloride 10 mL/hr at 09/19/13 2244   PRN Meds:.acetaminophen, acetaminophen, HYDROcodone-homatropine, ondansetron (ZOFRAN) IV, ondansetron  Antibiotics: Anti-infectives   Start     Dose/Rate Route Frequency Ordered Stop   09/20/13 2100  azithromycin (ZITHROMAX) 500 mg in dextrose 5 % 250 mL IVPB     500 mg 250 mL/hr over 60 Minutes Intravenous  Every 24 hours 09/19/13 2218     09/20/13 2000  cefTRIAXone (ROCEPHIN) 1 g in dextrose 5 % 50 mL IVPB     1 g 100 mL/hr over 30 Minutes Intravenous Every 24 hours 09/19/13 2222     09/19/13 2000  cefTRIAXone (ROCEPHIN) 1 g in dextrose 5 % 50 mL IVPB     1 g 100 mL/hr over 30 Minutes Intravenous  Once 09/19/13 1950 09/19/13 2035   09/19/13 2000  azithromycin (ZITHROMAX) 500 mg in dextrose 5 % 250 mL IVPB     500 mg 250 mL/hr over 60 Minutes Intravenous  Once 09/19/13 1950 09/19/13 2140       PHYSICAL EXAM: Vital signs in last 24 hours: Filed Vitals:   09/19/13 2143 09/19/13 2257 09/20/13 0202 09/20/13 0459  BP: 125/71 147/78  127/75  Pulse: 59 57  55  Temp:  97.6 F (36.4 C)  97.9 F (36.6 C)  TempSrc:  Oral  Oral  Resp: 13 16  18   Height:  5\' 8"  (1.727 m)    Weight:  77.3 kg (170 lb 6.7 oz)    SpO2: 96% 94% 93% 94%    Weight change:  Filed Weights   09/19/13 1746 09/19/13 2257  Weight: 78.019 kg (172 lb) 77.3 kg (170 lb 6.7 oz)   Body mass index is 25.92 kg/(m^2).   Gen Exam: Awake and alert with clear speech.   Neck: Supple, No JVD.   Chest:Decreased air entry on right-but clear CVS: S1 S2 Regular, no murmurs.  Abdomen: soft, BS +, non tender, non distended.  Extremities: no edema, lower extremities warm to touch. Neurologic: Non Focal.   Skin: No Rash.   Wounds: N/A.    Intake/Output from previous day:  Intake/Output Summary (Last 24 hours) at 09/20/13 0856 Last data filed at 09/20/13 0501  Gross per 24 hour  Intake      0 ml  Output    200 ml  Net   -200 ml     LAB RESULTS: CBC  Recent Labs Lab 09/19/13 1755 09/20/13 0639  WBC 7.2 7.4  HGB 16.0 15.3  HCT 46.9 45.6  PLT 152 157  MCV 91.8 92.9  MCH 31.3 31.2  MCHC 34.1 33.6  RDW 14.5 14.6    Chemistries   Recent Labs Lab 09/19/13 1755 09/20/13 0639  NA 138 138  K 4.0 4.2  CL 101 102  CO2 27 27  GLUCOSE 115* 97  BUN 21 19  CREATININE 1.14 1.08  CALCIUM 9.4 8.8    CBG: No  results found for this basename: GLUCAP,  in the last 168 hours  GFR Estimated Creatinine Clearance: 55.4 ml/min (by C-G formula based on Cr of 1.08).  Coagulation profile No results found for this basename: INR, PROTIME,  in the last 168 hours  Cardiac Enzymes No results found for this basename: CK, CKMB, TROPONINI, MYOGLOBIN,  in the last 168 hours  No components found with this basename: POCBNP,  No results found for this basename: DDIMER,  in the last 72 hours No results found for this basename: HGBA1C,  in the last 72 hours No results found for this basename: CHOL, HDL, LDLCALC, TRIG, CHOLHDL, LDLDIRECT,  in the last 72 hours No results found for this basename: TSH, T4TOTAL, FREET3, T3FREE, THYROIDAB,  in the last 72 hours No results found for this basename: VITAMINB12, FOLATE, FERRITIN, TIBC, IRON, RETICCTPCT,  in the last 72 hours No results found for this basename: LIPASE, AMYLASE,  in the last 72 hours  Urine Studies No results found for this basename: UACOL, UAPR, USPG, UPH, UTP, UGL, UKET, UBIL, UHGB, UNIT, UROB, ULEU, UEPI, UWBC, URBC, UBAC, CAST, CRYS, UCOM, BILUA,  in the last 72 hours  MICROBIOLOGY: No results found for this or any previous visit (from the past 240 hour(s)).  RADIOLOGY STUDIES/RESULTS: Dg Chest 2 View  09/19/2013   CLINICAL DATA:  PNEUMONIA SHORTNESS OF BREATH  EXAM: CHEST  2 VIEW  COMPARISON:  DG CHEST 2 VIEW dated 09/17/2013  FINDINGS: Cardiac silhouette is partially obscured by the stable moderate right pleural effusion. Stable right lower lobe infiltrate. Left lung is clear. No acute osseous abnormalities.  IMPRESSION: Stable right pleural effusion and right lower lobe infiltrate.   Electronically Signed   By: Margaree Mackintosh M.D.   On: 09/19/2013 18:45   Dg Chest 2 View  09/17/2013   CLINICAL DATA:  Pneumonia.  Cough.  EXAM: CHEST  2 VIEW  COMPARISON:  DG CHEST 2 VIEW dated 09/02/2013  FINDINGS: Mediastinum and hilar structures normal. Right lower lobe  infiltrate with a right-sided pleural effusion is present. Mild left base infiltrate cannot be excluded. No pneumothorax. Cardiomegaly. Pulmonary vascularity is normal. No acute bony abnormality.  IMPRESSION: 1. Right lower lobe infiltrate consistent with pneumonia. Associated moderate right-sided pleural effusion. 2. Mild infiltrate left lung base cannot be excluded.   Electronically Signed   By: [place]   On: 09/17/2013 15:44   Dg Chest 2 View  09/02/2013   CLINICAL DATA:  Cough.  EXAM: CHEST  2 VIEW  COMPARISON:  DG CHEST 2 VIEW dated 10/10/2012  FINDINGS: Mediastinum and hilar structures normal. Right lower lobe atelectatic changes and/or infiltrate noted. Right pleural effusion, possibly subpulmonic, may be present. Heart size normal. Normal pulmonary vascularity. No acute bony abnormality.  IMPRESSION: Mild right lower lobe atelectatic changes and/or infiltrate with right pleural effusion . This is a new finding from 10/10/2012.   Electronically Signed   By: [place]   On: 09/02/2013 13:35   Ct Chest Wo Contrast  09/19/2013   CLINICAL DATA:  Shortness of breath. Clinical diagnosis of pneumonia.  EXAM: CT CHEST WITHOUT CONTRAST  TECHNIQUE: Multidetector CT imaging of the chest was performed following the standard protocol without IV contrast.  COMPARISON:  Chest radiographs obtained earlier today.  FINDINGS: Large right pleural effusion. Compressive atelectasis of the adjacent portions of the right lung. No visible pleural thickening or pleural based masses. Atheromatous coronary artery calcifications. Minimal linear atelectasis or scarring at the left lung base. No lung nodules or enlarged lymph nodes seen.  Partially included left renal cysts. 2.1 x 1.8 cm exophytic medial upper right renal mass measuring 38 Hounsfield units in density on image number 65. Mild thoracic spine degenerative changes.  IMPRESSION: 1. Large right pleural effusion. 2. Compressive atelectasis of the right lung,  most pronounced involving the lower lobe. 3. Atheromatous coronary artery calcifications. 4. 2.1 cm exophytic right renal complex cyst or solid mass. If the patient's renal function allows it, elective pre and postcontrast CT or magnetic resonance imaging of the kidneys would be recommended.   Electronically Signed   By: Enrique Sack M.D.   On: 09/19/2013 21:54    Jahna Liebert Kristeen Mans, MD  [facility] Pager:[phone]  If 7PM-7AM, please contact night-coverage [email or web address] Password [email or web address] 09/20/2013, 8:56 AM   LOS: 1 day   **Disclaimer: This note may have been dictated with voice recognition software. Similar sounding words can inadvertently be transcribed and this note may contain transcription errors which may not have been corrected upon publication of note.**

## 2013-09-20 NOTE — Progress Notes (Signed)
Echo Lab  2D Echocardiogram completed.  [place], [place] 09/20/2013 3:55 PM

## 2013-09-20 NOTE — Plan of Care (Signed)
Problem: Phase I Progression Outcomes Goal: Progress activity as tolerated unless otherwise ordered Outcome: Completed/Met Date Met:  09/20/13 Pt. Ambulating in halls

## 2013-09-20 NOTE — Progress Notes (Signed)
UR completed. Emy Angevine RN CCM Case Mgmt phone [phone] 

## 2013-09-20 NOTE — Progress Notes (Signed)
Pt admitted to unit from ED. Pt A&O, VS stable, and skin intact. Pt oriented to unit, call bell w/in reach, and currently resting comfortably in bed. Will continue to monitor.

## 2013-09-20 NOTE — Procedures (Signed)
Successful US guided right thoracentesis. Yielded 1.2 liters of yellow fluid. Pt tolerated procedure well. No immediate complications.  Specimen was sent for labs. CXR ordered.  Hedy Jacob PA-C 09/20/2013 3:10 PM

## 2013-09-20 NOTE — Consult Note (Signed)
Name: Reda Gettis. MRN: [id number] DOB: 01/17/1934    ADMISSION DATE:  09/19/2013 CONSULTATION DATE:  09/19/13   REFERRING MD :  Dr. Sloan Leiter PRIMARY SERVICE:  TRH  CHIEF COMPLAINT:  Pleural Effusion   BRIEF PATIENT DESCRIPTION: 80 y/o M, non-smoker, with PMH of asbestos exposures admitted to [facility] on 5/21 with approximately 1 month hx of non-productive cough & SOB.  Recent Rx for PNA with doxycycline & pred taper.  Further found to have a R pleural effusion and was scheduled to see Dr. Lamonte Sakai but was unable to make the appointment.  Symptoms worsen end and he was admitted by [facility] on 5/21 for further evaluation.    SIGNIFICANT EVENTS / STUDIES:  5/04 - seen by PCP for 2 wk hx of cough, PND, increasing SOB.  Rx'd with doxy for PNA x 10d, planned f/u in 3 wks with cxr 5/11 - seen by PCP again for ongoing sx: intermittent cough, fatigue, DOE 5/19 - seen by PCP CXR with RLL infiltrate & pleural effusion, Pulm consult scheduled for 5/22 5/21 - called PCP again for ongoing sputum production     HISTORY OF PRESENT ILLNESS: 80 y/o M, non-smoker, with PMH of asbestos exposures admitted to [facility] on 5/21 with approximately 1 month hx of non-productive cough & SOB.  Recent Rx for PNA with doxycycline & pred taper.  Further found to have a R pleural effusion and was scheduled to see Dr. Lamonte Sakai but was unable to make the appointment.  Symptoms worsen end and he was admitted by [facility] on 5/21 for further evaluation.   PAST MEDICAL HISTORY :  Past Medical History  Diagnosis Date  . Personal history of contact with and (suspected) exposure to asbestos   . Unspecified essential hypertension   . Other and unspecified hyperlipidemia   . Esophageal reflux   . Diverticulosis of colon (without mention of hemorrhage)   . Elevated prostate specific antigen (PSA)   . Osteoarthrosis, unspecified whether generalized or localized, unspecified site   . Anxiety state, unspecified   . Rectal mass    Past Surgical  History  Procedure Laterality Date  . Inguinal hernia repair      left   Prior to Admission medications   Medication Sig Start Date End Date Taking? Authorizing Provider  aspirin 81 MG tablet Take 81 mg by mouth daily.     Yes Historical Provider, MD  atenolol (TENORMIN) 25 MG tablet Take 25 mg by mouth daily.   Yes Historical Provider, MD  esomeprazole (NEXIUM) 20 MG capsule Take 20 mg by mouth daily at 12 noon.   Yes Historical Provider, MD  finasteride (PROSCAR) 5 MG tablet Take 1 tablet (5 mg total) by mouth daily. 10/10/12  Yes Dorena Cookey, MD  fish oil-omega-3 fatty acids 1000 MG capsule Take 2 g by mouth 2 (two) times daily.    Yes Historical Provider, MD  glucosamine-chondroitin 500-400 MG tablet Take 1 tablet by mouth 2 (two) times daily.    Yes Historical Provider, MD  HYDROcodone-homatropine (HYCODAN) 5-1.5 MG/5ML syrup Take 5 mLs by mouth every 8 (eight) hours as needed for cough. 09/02/13  Yes Dorena Cookey, MD  ibuprofen (ADVIL,MOTRIN) 200 MG tablet Take 400 mg by mouth every 8 (eight) hours as needed for moderate pain.   Yes Historical Provider, MD  multivitamin [facility]) per tablet Take 1 tablet by mouth daily.     Yes Historical Provider, MD  naproxen sodium (ANAPROX) 220 MG tablet Take 220 mg  by mouth daily as needed (for pain).   Yes Historical Provider, MD  pravastatin (PRAVACHOL) 40 MG tablet Take 1 tablet (40 mg total) by mouth every other day. 10/10/12  Yes Dorena Cookey, MD  predniSONE (DELTASONE) 20 MG tablet 2 tabs x 3 days, 1 tab x 3 days, 1/2 tab x 3 days, 1/2 tab M,W,F x 2 weeks 09/02/13  Yes Dorena Cookey, MD  terazosin (HYTRIN) 10 MG capsule Take 1 capsule (10 mg total) by mouth at bedtime. 10/10/12  Yes Dorena Cookey, MD  EPINEPHrine (EPIPEN 2-PAK) 0.3 mg/0.3 mL DEVI Inject 0.3 mLs (0.3 mg total) into the muscle once. 10/10/12   Dorena Cookey, MD   No Known Allergies  FAMILY HISTORY:  Family History  Problem Relation Age of Onset  . Coronary artery disease  Father   . Coronary artery disease Mother   . Heart attack Brother   . Colon cancer Father     ? not exactly sure   SOCIAL HISTORY:  reports that he has never smoked. He does not have any smokeless tobacco history on file. He reports that he does not drink alcohol or use illicit drugs.  REVIEW OF SYSTEMS:   Constitutional: Negative for fever, chills, weight loss, malaise/fatigue and diaphoresis.  HENT: Negative for hearing loss, ear pain, nosebleeds, congestion, sore throat, neck pain, tinnitus and ear discharge.   Eyes: Negative for blurred vision, double vision, photophobia, pain, discharge and redness.  Respiratory: Negative for cough, hemoptysis, sputum production, shortness of breath, wheezing and stridor.   Cardiovascular: Negative for chest pain, palpitations, orthopnea, claudication, leg swelling and PND.  Gastrointestinal: Negative for heartburn, nausea, vomiting, abdominal pain, diarrhea, constipation, blood in stool and melena.  Genitourinary: Negative for dysuria, urgency, frequency, hematuria and flank pain.  Musculoskeletal: Negative for myalgias, back pain, joint pain and falls.  Skin: Negative for itching and rash.  Neurological: Negative for dizziness, tingling, tremors, sensory change, speech change, focal weakness, seizures, loss of consciousness, weakness and headaches.  Endo/Heme/Allergies: Negative for environmental allergies and polydipsia. Does not bruise/bleed easily.  SUBJECTIVE:   VITAL SIGNS: Temp:  [97.6 F (36.4 C)-98 F (36.7 C)] 97.9 F (36.6 C) (05/22 0459) Pulse Rate:  [54-78] 55 (05/22 0459) Resp:  [13-21] 18 (05/22 0459) BP: (125-147)/(71-85) 127/75 mmHg (05/22 0459) SpO2:  [92 %-97 %] 94 % (05/22 0459) Weight:  [170 lb 6.7 oz (77.3 kg)-173 lb (78.472 kg)] 170 lb 6.7 oz (77.3 kg) (05/21 2257)  PHYSICAL EXAMINATION: Gen. Pleasant, well-nourished, in no distress, normal affect ENT - no lesions, no post nasal drip Neck: No JVD, no thyromegaly, no  carotid bruits Lungs: no use of accessory muscles,dullness to percussion rt infrascapular, decreased rt base without rales or rhonchi  Cardiovascular: Rhythm regular, heart sounds  normal, no murmurs, no peripheral edema Abdomen: soft and non-tender, no hepatosplenomegaly, BS normal. Musculoskeletal: No deformities, no cyanosis or clubbing Neuro:  alert, non focal Skin:  Warm, no lesions/ rash    Recent Labs Lab 09/19/13 1755 09/20/13 0639  NA 138 138  K 4.0 4.2  CL 101 102  CO2 27 27  BUN 21 19  CREATININE 1.14 1.08  GLUCOSE 115* 97    Recent Labs Lab 09/19/13 1755 09/20/13 0639  HGB 16.0 15.3  HCT 46.9 45.6  WBC 7.2 7.4  PLT 152 157   Dg Chest 2 View  09/19/2013   CLINICAL DATA:  PNEUMONIA SHORTNESS OF BREATH  EXAM: CHEST  2 VIEW  COMPARISON:  DG CHEST  2 VIEW dated 09/17/2013  FINDINGS: Cardiac silhouette is partially obscured by the stable moderate right pleural effusion. Stable right lower lobe infiltrate. Left lung is clear. No acute osseous abnormalities.  IMPRESSION: Stable right pleural effusion and right lower lobe infiltrate.   Electronically Signed   By: Margaree Mackintosh M.D.   On: 09/19/2013 18:45   Ct Chest Wo Contrast  09/19/2013   CLINICAL DATA:  Shortness of breath. Clinical diagnosis of pneumonia.  EXAM: CT CHEST WITHOUT CONTRAST  TECHNIQUE: Multidetector CT imaging of the chest was performed following the standard protocol without IV contrast.  COMPARISON:  Chest radiographs obtained earlier today.  FINDINGS: Large right pleural effusion. Compressive atelectasis of the adjacent portions of the right lung. No visible pleural thickening or pleural based masses. Atheromatous coronary artery calcifications. Minimal linear atelectasis or scarring at the left lung base. No lung nodules or enlarged lymph nodes seen.  Partially included left renal cysts. 2.1 x 1.8 cm exophytic medial upper right renal mass measuring 38 Hounsfield units in density on image number 65. Mild  thoracic spine degenerative changes.  IMPRESSION: 1. Large right pleural effusion. 2. Compressive atelectasis of the right lung, most pronounced involving the lower lobe. 3. Atheromatous coronary artery calcifications. 4. 2.1 cm exophytic right renal complex cyst or solid mass. If the patient's renal function allows it, elective pre and postcontrast CT or magnetic resonance imaging of the kidneys would be recommended.   Electronically Signed   By: Enrique Sack M.D.   On: 09/19/2013 21:54    ASSESSMENT / PLAN:  Right Pleural Effusion  Appears free flowing but unable to visualise on our ultrasound machine (may be related to technical reasons) hence unable to tap bedside  Plan: Have asked IR to proceed with US guided thoracentesis - diagnostic & therapeutic. Send for cell count, LDH/prot/glu , cultures, afb & cytology Concern for occult malignancy here, no overt signs of infection WIll follow results  Renal Rt complex cyst with hematuria - MRI imaging is planned  I have discussed plan with pt & family  Kara Mead MD. FCCP. [facility] Pager [phone] If no response call [phone]    09/20/2013, 9:08 AM

## 2013-09-20 NOTE — Progress Notes (Signed)
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  Not direct exposure on floor but exposed.   Smoking:  Never smoker Living Condition:  Grew up in [place], [place] Pets: dog, no birds etc Autoimmune:  No hx of RA or Lupus   Noe Gens, NP-C [facility] Pgr: [phone] or [phone]

## 2013-09-21 ENCOUNTER — Inpatient Hospital Stay (HOSPITAL_COMMUNITY): Payer: Medicare Other

## 2013-09-21 LAB — BASIC METABOLIC PANEL
BUN: 26 mg/dL — AB (ref 6–23)
CALCIUM: 8.8 mg/dL (ref 8.4–10.5)
CHLORIDE: 101 meq/L (ref 96–112)
CO2: 27 meq/L (ref 19–32)
Creatinine, Ser: 1.11 mg/dL (ref 0.50–1.35)
GFR calc Af Amer: 72 mL/min — ABNORMAL LOW (ref >90)
GFR calc non Af Amer: 62 mL/min — ABNORMAL LOW (ref >90)
GLUCOSE: 94 mg/dL (ref 70–99)
Potassium: 4 mEq/L (ref 3.7–5.3)
Sodium: 137 mEq/L (ref 137–147)

## 2013-09-21 LAB — CBC
HEMATOCRIT: 43.7 % (ref 39.0–52.0)
Hemoglobin: 15.2 g/dL (ref 13.0–17.0)
MCH: 32.1 pg (ref 26.0–34.0)
MCHC: 34.8 g/dL (ref 30.0–36.0)
MCV: 92.2 fL (ref 78.0–100.0)
Platelets: 158 10*3/uL (ref 150–400)
RBC: 4.74 MIL/uL (ref 4.22–5.81)
RDW: 14.3 % (ref 11.5–15.5)
WBC: 7.5 10*3/uL (ref 4.0–10.5)

## 2013-09-21 LAB — FUNGAL STAIN: Fungal Smear: NONE SEEN

## 2013-09-21 MED ORDER — GADOBENATE DIMEGLUMINE 529 MG/ML IV SOLN
17.0000 mL | Freq: Once | INTRAVENOUS | Status: AC | PRN
Start: 2013-09-21 — End: 2013-09-21
  Administered 2013-09-21: 17 mL via INTRAVENOUS

## 2013-09-21 NOTE — Progress Notes (Signed)
Name: Janie Strothman. MRN: [id number] DOB: July 25, 1933    ADMISSION DATE:  09/19/2013 CONSULTATION DATE:  09/19/13   REFERRING MD :  Dr. Sloan Leiter PRIMARY SERVICE:  TRH  CHIEF COMPLAINT:  Pleural Effusion   BRIEF PATIENT DESCRIPTION: 80 y/o M, non-smoker, with PMH of asbestos exposures admitted to [facility] on 5/21 with approximately 1 month hx of non-productive cough & SOB.  Recent Rx for PNA with doxycycline & pred taper.  Further found to have a R pleural effusion and was scheduled to see Dr. Lamonte Sakai but was unable to make the appointment.  Symptoms worsen end and he was admitted by [facility] on 5/21 for further evaluation.    SIGNIFICANT EVENTS / STUDIES:  5/04 - seen by PCP for 2 wk hx of cough, PND, increasing SOB.  Rx'd with doxy for PNA x 10d, planned f/u in 3 wks with cxr 5/11 - seen by PCP again for ongoing sx: intermittent cough, fatigue, DOE 5/19 - seen by PCP CXR with RLL infiltrate & pleural effusion, Pulm consult scheduled for 5/22 5/21 - called PCP again for ongoing sputum production     HISTORY OF PRESENT ILLNESS: 80 y/o M, non-smoker, with PMH of asbestos exposures admitted to [facility] on 5/21 with approximately 1 month hx of non-productive cough & SOB.  Recent Rx for PNA with doxycycline & pred taper.  Further found to have a R pleural effusion and was scheduled to see Dr. Lamonte Sakai but was unable to make the appointment.  Symptoms worsen end and he was admitted by [facility] on 5/21 for further evaluation.     SUBJECTIVE: Patient just back from [facility]. Wife(RN) in room. Breathing is easier that before thoracentesis. No chest pain or significant cough. Pt is restless and would like not to have to sit and wait in hospital.  VITAL SIGNS: Temp:  [97.9 F (36.6 C)-98.6 F (37 C)] 98.1 F (36.7 C) (05/23 0446) Pulse Rate:  [60-70] 60 (05/23 0446) Resp:  [18] 18 (05/23 0446) BP: (101-120)/(60-70) 119/70 mmHg (05/23 0446) SpO2:  [94 %-96 %] 94 % (05/23 0446)  PHYSICAL EXAMINATION: Gen. Pleasant,  well-nourished, in no distress, normal affect ENT - no lesions, no post nasal drip Neck: No JVD, no thyromegaly, no carotid bruits Lungs: no use of accessory muscles,dullness to percussion rt midback, few rhonchi on R, w/o rub  Cardiovascular: Rhythm regular, heart sounds  normal, no murmurs, no peripheral edema Abdomen: soft and non-tender, no hepatosplenomegaly, BS normal. Musculoskeletal: No deformities, no cyanosis or clubbing Neuro:  alert, non focal Skin:  Warm, no lesions/ rash    Recent Labs Lab 09/19/13 1755 09/20/13 0639 09/21/13 0420  NA 138 138 137  K 4.0 4.2 4.0  CL 101 102 101  CO2 27 27 27   BUN 21 19 26*  CREATININE 1.14 1.08 1.11  GLUCOSE 115* 97 94    Recent Labs Lab 09/19/13 1755 09/20/13 0639 09/21/13 0420  HGB 16.0 15.3 15.2  HCT 46.9 45.6 43.7  WBC 7.2 7.4 7.5  PLT 152 157 158   Dg Chest 1 View  09/20/2013   CLINICAL DATA:  Status post right thoracentesis, 1.2 L.  EXAM: CHEST - 1 VIEW  COMPARISON:  DG CHEST 2 VIEW dated 09/19/2013; CT CHEST W/O CM dated 09/19/2013  FINDINGS: Residual right pleural effusion occupies about 60% of right hemithoracic volume. No visible pneumothorax.  Left lung appears clear. Cardiac and mediastinal margins appear normal.  IMPRESSION: 1. No pneumothorax status post thoracentesis. Residual right pleural effusion occupies  about 60% of the right hemithorax.   Electronically Signed   By: Sherryl Barters M.D.   On: 09/20/2013 15:47   Dg Chest 2 View  09/19/2013   CLINICAL DATA:  PNEUMONIA SHORTNESS OF BREATH  EXAM: CHEST  2 VIEW  COMPARISON:  DG CHEST 2 VIEW dated 09/17/2013  FINDINGS: Cardiac silhouette is partially obscured by the stable moderate right pleural effusion. Stable right lower lobe infiltrate. Left lung is clear. No acute osseous abnormalities.  IMPRESSION: Stable right pleural effusion and right lower lobe infiltrate.   Electronically Signed   By: Margaree Mackintosh M.D.   On: 09/19/2013 18:45   Ct Chest Wo  Contrast  09/19/2013   CLINICAL DATA:  Shortness of breath. Clinical diagnosis of pneumonia.  EXAM: CT CHEST WITHOUT CONTRAST  TECHNIQUE: Multidetector CT imaging of the chest was performed following the standard protocol without IV contrast.  COMPARISON:  Chest radiographs obtained earlier today.  FINDINGS: Large right pleural effusion. Compressive atelectasis of the adjacent portions of the right lung. No visible pleural thickening or pleural based masses. Atheromatous coronary artery calcifications. Minimal linear atelectasis or scarring at the left lung base. No lung nodules or enlarged lymph nodes seen.  Partially included left renal cysts. 2.1 x 1.8 cm exophytic medial upper right renal mass measuring 38 Hounsfield units in density on image number 65. Mild thoracic spine degenerative changes.  IMPRESSION: 1. Large right pleural effusion. 2. Compressive atelectasis of the right lung, most pronounced involving the lower lobe. 3. Atheromatous coronary artery calcifications. 4. 2.1 cm exophytic right renal complex cyst or solid mass. If the patient's renal function allows it, elective pre and postcontrast CT or magnetic resonance imaging of the kidneys would be recommended.   Electronically Signed   By: Enrique Sack M.D.   On: 09/19/2013 21:54   US Thoracentesis Asp Pleural Space W/img Guide  09/20/2013   CLINICAL DATA:  Right pleural effusion, request for thoracentesis.  EXAM: ULTRASOUND GUIDED right THORACENTESIS  COMPARISON:  None.  PROCEDURE: An ultrasound guided thoracentesis was thoroughly discussed with the patient and questions answered. The benefits, risks, alternatives and complications were also discussed. The patient understands and wishes to proceed with the procedure. Written consent was obtained.  Ultrasound was performed to localize and mark an adequate pocket of fluid in the right chest. The area was then prepped and draped in the normal sterile fashion. 1% Lidocaine was used for local  anesthesia. Under ultrasound guidance a 19 gauge Yueh catheter was introduced. Thoracentesis was performed. The catheter was removed and a dressing applied.  Complications:  None.  FINDINGS: A total of approximately 1.2 liters of yellow fluid was removed. A fluid sample was sent for laboratory analysis.  IMPRESSION: Successful ultrasound guided right thoracentesis yielding 1.2 liters of pleural fluid.  Read By:  Tsosie Billing PA-C   Electronically Signed   By: Maryclare Bean M.D.   On: 09/20/2013 16:22    ASSESSMENT / PLAN:  Right Pleural Effusion  Exudative, non-bloody fluid. It doesn't look as if cultures will be positive. That makes cancer highly likely. Cytology won't be back for several days. I would favor T-SGY VATS for possible mesothelioma, unless other information appears. T-sgy might ask that he be tapped dry first, but it looks as if this will rapidly re-accumulate.   Plan:  Concern for occult malignancy here, no overt signs of infection Suggest you ask T-Sgy at what point they would want to see him. Possibly discharge home if they want to wait.  WIll follow results  Renal Rt complex cyst with hematuria - MRI result pending  I have discussed fully with pt & family  CD Annamaria Boots, MD [facility] Pulmonary & Critical care Pager [phone]  [phone] If no response call [phone]    09/21/2013, 9:03 AM

## 2013-09-21 NOTE — Progress Notes (Signed)
Nsg Discharge Note  Admit Date:  09/19/2013 Discharge date: 09/21/2013   Normal Recinos. to be D/C'd Home per MD order.  AVS completed.  Copy for chart, and copy for patient signed, and dated. Patient/caregiver able to verbalize understanding.  Discharge Medication:   Medication List    STOP taking these medications       naproxen sodium 220 MG tablet  Commonly known as:  ANAPROX     predniSONE 20 MG tablet  Commonly known as:  DELTASONE      TAKE these medications       aspirin 81 MG tablet  Take 81 mg by mouth daily.     atenolol 25 MG tablet  Commonly known as:  TENORMIN  Take 25 mg by mouth daily.     EPINEPHrine 0.3 mg/0.3 mL Soaj injection  Commonly known as:  EPIPEN 2-PAK  Inject 0.3 mLs (0.3 mg total) into the muscle once.     esomeprazole 20 MG capsule  Commonly known as:  NEXIUM  Take 20 mg by mouth daily at 12 noon.     finasteride 5 MG tablet  Commonly known as:  PROSCAR  Take 1 tablet (5 mg total) by mouth daily.     fish oil-omega-3 fatty acids 1000 MG capsule  Take 2 g by mouth 2 (two) times daily.     glucosamine-chondroitin 500-400 MG tablet  Take 1 tablet by mouth 2 (two) times daily.     HYDROcodone-homatropine 5-1.5 MG/5ML syrup  Commonly known as:  HYCODAN  Take 5 mLs by mouth every 8 (eight) hours as needed for cough.     ibuprofen 200 MG tablet  Commonly known as:  ADVIL,MOTRIN  Take 400 mg by mouth every 8 (eight) hours as needed for moderate pain.     multivitamin per tablet  Take 1 tablet by mouth daily.     pravastatin 40 MG tablet  Commonly known as:  PRAVACHOL  Take 1 tablet (40 mg total) by mouth every other day.     terazosin 10 MG capsule  Commonly known as:  HYTRIN  Take 1 capsule (10 mg total) by mouth at bedtime.        Discharge Assessment: Filed Vitals:   09/21/13 1256  BP: 118/74  Pulse: 116  Temp: 97.6 F (36.4 C)  Resp: 16   Skin clean, dry and intact without evidence of skin break down, no evidence  of skin tears noted. IV catheter discontinued intact. Site without signs and symptoms of complications - no redness or edema noted at insertion site, patient denies c/o pain - only slight tenderness at site.  Dressing with slight pressure applied.  D/c Instructions-Education: Discharge instructions given to patient/family with verbalized understanding. D/c education completed with patient/family including follow up instructions, medication list, d/c activities limitations if indicated, with other d/c instructions as indicated by MD - patient able to verbalize understanding, all questions fully answered. Patient instructed to return to ED, call 911, or call MD for any changes in condition.  Patient escorted via [place], and D/C home via private auto.  Dayle Points, RN 09/21/2013 1:30 PM

## 2013-09-21 NOTE — Discharge Summary (Signed)
PATIENT DETAILS Name: Sean Mitchell. Age: 80 y.o. Sex: male Date of Birth: June 18, 1933 MRN: [id number]. Admit Date: 09/19/2013 Admitting Physician: Rise Patience, MD TDS:KAJG,OTLXBWI ALLEN, MD  Recommendations for Outpatient Follow-up:  1. Please follow Pleural Fluid cytology  2. Please refer to Cardiothoracic surgery for VATS/Pleurex catheter placement if needed 3. Has microscopic hematuria, will likely need further outpatient Urology evaluation  PRIMARY DISCHARGE DIAGNOSIS:  Active Problems:   HYPERLIPIDEMIA   HYPERTENSION   Pleural effusion   Pneumonia      PAST MEDICAL HISTORY: Past Medical History  Diagnosis Date  . Personal history of contact with and (suspected) exposure to asbestos   . Unspecified essential hypertension   . Other and unspecified hyperlipidemia   . Esophageal reflux   . Diverticulosis of colon (without mention of hemorrhage)   . Elevated prostate specific antigen (PSA)   . Osteoarthrosis, unspecified whether generalized or localized, unspecified site   . Anxiety state, unspecified   . Rectal mass     DISCHARGE MEDICATIONS:   Medication List    STOP taking these medications       naproxen sodium 220 MG tablet  Commonly known as:  ANAPROX     predniSONE 20 MG tablet  Commonly known as:  DELTASONE      TAKE these medications       aspirin 81 MG tablet  Take 81 mg by mouth daily.     atenolol 25 MG tablet  Commonly known as:  TENORMIN  Take 25 mg by mouth daily.     EPINEPHrine 0.3 mg/0.3 mL Soaj injection  Commonly known as:  EPIPEN 2-PAK  Inject 0.3 mLs (0.3 mg total) into the muscle once.     esomeprazole 20 MG capsule  Commonly known as:  NEXIUM  Take 20 mg by mouth daily at 12 noon.     finasteride 5 MG tablet  Commonly known as:  PROSCAR  Take 1 tablet (5 mg total) by mouth daily.     fish oil-omega-3 fatty acids 1000 MG capsule  Take 2 g by mouth 2 (two) times daily.     glucosamine-chondroitin 500-400 MG  tablet  Take 1 tablet by mouth 2 (two) times daily.     HYDROcodone-homatropine 5-1.5 MG/5ML syrup  Commonly known as:  HYCODAN  Take 5 mLs by mouth every 8 (eight) hours as needed for cough.     ibuprofen 200 MG tablet  Commonly known as:  ADVIL,MOTRIN  Take 400 mg by mouth every 8 (eight) hours as needed for moderate pain.     multivitamin per tablet  Take 1 tablet by mouth daily.     pravastatin 40 MG tablet  Commonly known as:  PRAVACHOL  Take 1 tablet (40 mg total) by mouth every other day.     terazosin 10 MG capsule  Commonly known as:  HYTRIN  Take 1 capsule (10 mg total) by mouth at bedtime.        ALLERGIES:  No Known Allergies  BRIEF HPI:  See H&P, Labs, Consult and Test reports for all details in brief, Zeno Hickel. is a 80 y.o. male with history of hypertension and hyperlipidemia presented with Shortness of breath.Chest x-ray shows right-sided pleural effusion and infiltrates. Patient was admitted for further evaluation and treatment.  CONSULTATIONS:   pulmonary/intensive care  PERTINENT RADIOLOGIC STUDIES: Dg Chest 1 View  09/20/2013   CLINICAL DATA:  Status post right thoracentesis, 1.2 L.  EXAM: CHEST - 1 VIEW  COMPARISON:  DG CHEST 2 VIEW dated 09/19/2013; CT CHEST W/O CM dated 09/19/2013  FINDINGS: Residual right pleural effusion occupies about 60% of right hemithoracic volume. No visible pneumothorax.  Left lung appears clear. Cardiac and mediastinal margins appear normal.  IMPRESSION: 1. No pneumothorax status post thoracentesis. Residual right pleural effusion occupies about 60% of the right hemithorax.   Electronically Signed   By: Sherryl Barters M.D.   On: 09/20/2013 15:47   Dg Chest 2 View  09/19/2013   CLINICAL DATA:  PNEUMONIA SHORTNESS OF BREATH  EXAM: CHEST  2 VIEW  COMPARISON:  DG CHEST 2 VIEW dated 09/17/2013  FINDINGS: Cardiac silhouette is partially obscured by the stable moderate right pleural effusion. Stable right lower lobe infiltrate.  Left lung is clear. No acute osseous abnormalities.  IMPRESSION: Stable right pleural effusion and right lower lobe infiltrate.   Electronically Signed   By: Margaree Mackintosh M.D.   On: 09/19/2013 18:45   Dg Chest 2 View  09/17/2013   CLINICAL DATA:  Pneumonia.  Cough.  EXAM: CHEST  2 VIEW  COMPARISON:  DG CHEST 2 VIEW dated 09/02/2013  FINDINGS: Mediastinum and hilar structures normal. Right lower lobe infiltrate with a right-sided pleural effusion is present. Mild left base infiltrate cannot be excluded. No pneumothorax. Cardiomegaly. Pulmonary vascularity is normal. No acute bony abnormality.  IMPRESSION: 1. Right lower lobe infiltrate consistent with pneumonia. Associated moderate right-sided pleural effusion. 2. Mild infiltrate left lung base cannot be excluded.   Electronically Signed   By: [place]   On: 09/17/2013 15:44   Dg Chest 2 View  09/02/2013   CLINICAL DATA:  Cough.  EXAM: CHEST  2 VIEW  COMPARISON:  DG CHEST 2 VIEW dated 10/10/2012  FINDINGS: Mediastinum and hilar structures normal. Right lower lobe atelectatic changes and/or infiltrate noted. Right pleural effusion, possibly subpulmonic, may be present. Heart size normal. Normal pulmonary vascularity. No acute bony abnormality.  IMPRESSION: Mild right lower lobe atelectatic changes and/or infiltrate with right pleural effusion . This is a new finding from 10/10/2012.   Electronically Signed   By: [place]   On: 09/02/2013 13:35   Ct Chest Wo Contrast  09/19/2013   CLINICAL DATA:  Shortness of breath. Clinical diagnosis of pneumonia.  EXAM: CT CHEST WITHOUT CONTRAST  TECHNIQUE: Multidetector CT imaging of the chest was performed following the standard protocol without IV contrast.  COMPARISON:  Chest radiographs obtained earlier today.  FINDINGS: Large right pleural effusion. Compressive atelectasis of the adjacent portions of the right lung. No visible pleural thickening or pleural based masses. Atheromatous coronary artery  calcifications. Minimal linear atelectasis or scarring at the left lung base. No lung nodules or enlarged lymph nodes seen.  Partially included left renal cysts. 2.1 x 1.8 cm exophytic medial upper right renal mass measuring 38 Hounsfield units in density on image number 65. Mild thoracic spine degenerative changes.  IMPRESSION: 1. Large right pleural effusion. 2. Compressive atelectasis of the right lung, most pronounced involving the lower lobe. 3. Atheromatous coronary artery calcifications. 4. 2.1 cm exophytic right renal complex cyst or solid mass. If the patient's renal function allows it, elective pre and postcontrast CT or magnetic resonance imaging of the kidneys would be recommended.   Electronically Signed   By: Enrique Sack M.D.   On: 09/19/2013 21:54   Mr Abdomen W Wo Contrast  09/21/2013   CLINICAL DATA:  Follow up right renal mass demonstrated on chest CT.  EXAM: MRI ABDOMEN WITHOUT AND  WITH CONTRAST  TECHNIQUE: Multiplanar multisequence MR imaging of the abdomen was performed both before and after the administration of intravenous contrast.  CONTRAST:  45mL MULTIHANCE GADOBENATE DIMEGLUMINE 529 MG/ML IV SOLN  COMPARISON:  Chest CT 09/19/2013.  FINDINGS: There are numerous bilateral renal cysts. By MRI, these all appear simple with uniformly increased T2 and decreased T1 signal. There is no enhancement, septations or mural nodularity of any of these lesions. The lesion of concern medially in the upper pole of the right kidney on CT which demonstrated mildly increased density measures 2.1 x 1.6 x 1.9 cm and appears simple. The largest cyst is exophytic, involving the lower pole of the right kidney, measuring 9.7 x 7.9 x 8.7 cm. There is no evidence of enhancing renal mass.  The liver, spleen, gallbladder, biliary system, pancreas and adrenal glands appear unremarkable. There is no abdominal lymphadenopathy. Degenerative disc disease is noted at L5-S1.  As seen on the recent CT, there is a large  complex right pleural effusion with heterogeneous T2 signal, best seen on the coronal images. Post-contrast, there is thin pleural enhancement within the right hemithorax.  IMPRESSION: 1. No suspicious renal findings. There are multiple renal cysts bilaterally, all appearing simple by MRI. 2. No acute abdominal findings. 3. Large complex right pleural effusion. Correlation with recent thoracentesis results recommended.   Electronically Signed   By: Camie Patience M.D.   On: 09/21/2013 09:16   US Thoracentesis Asp Pleural Space W/img Guide  09/20/2013   CLINICAL DATA:  Right pleural effusion, request for thoracentesis.  EXAM: ULTRASOUND GUIDED right THORACENTESIS  COMPARISON:  None.  PROCEDURE: An ultrasound guided thoracentesis was thoroughly discussed with the patient and questions answered. The benefits, risks, alternatives and complications were also discussed. The patient understands and wishes to proceed with the procedure. Written consent was obtained.  Ultrasound was performed to localize and mark an adequate pocket of fluid in the right chest. The area was then prepped and draped in the normal sterile fashion. 1% Lidocaine was used for local anesthesia. Under ultrasound guidance a 19 gauge Yueh catheter was introduced. Thoracentesis was performed. The catheter was removed and a dressing applied.  Complications:  None.  FINDINGS: A total of approximately 1.2 liters of yellow fluid was removed. A fluid sample was sent for laboratory analysis.  IMPRESSION: Successful ultrasound guided right thoracentesis yielding 1.2 liters of pleural fluid.  Read By:  Tsosie Billing PA-C   Electronically Signed   By: Maryclare Bean M.D.   On: 09/20/2013 16:22     PERTINENT LAB RESULTS: CBC:  Recent Labs  09/20/13 0639 09/21/13 0420  WBC 7.4 7.5  HGB 15.3 15.2  HCT 45.6 43.7  PLT 157 158   CMET CMP     Component Value Date/Time   NA 137 09/21/2013 0420   K 4.0 09/21/2013 0420   CL 101 09/21/2013 0420   CO2 27  09/21/2013 0420   GLUCOSE 94 09/21/2013 0420   BUN 26* 09/21/2013 0420   CREATININE 1.11 09/21/2013 0420   CALCIUM 8.8 09/21/2013 0420   PROT 6.7 09/20/2013 1356   ALBUMIN 3.5 10/03/2012 0819   AST 25 10/03/2012 0819   ALT 20 10/03/2012 0819   ALKPHOS 45 10/03/2012 0819   BILITOT 0.9 10/03/2012 0819   GFRNONAA 62* 09/21/2013 0420   GFRAA 72* 09/21/2013 0420    GFR Estimated Creatinine Clearance: 53.9 ml/min (by C-G formula based on Cr of 1.11). No results found for this basename: LIPASE, AMYLASE,  in the  last 72 hours No results found for this basename: CKTOTAL, CKMB, CKMBINDEX, TROPONINI,  in the last 72 hours No components found with this basename: POCBNP,  No results found for this basename: DDIMER,  in the last 72 hours No results found for this basename: HGBA1C,  in the last 72 hours No results found for this basename: CHOL, HDL, LDLCALC, TRIG, CHOLHDL, LDLDIRECT,  in the last 72 hours No results found for this basename: TSH, T4TOTAL, FREET3, T3FREE, THYROIDAB,  in the last 72 hours No results found for this basename: VITAMINB12, FOLATE, FERRITIN, TIBC, IRON, RETICCTPCT,  in the last 72 hours Coags: No results found for this basename: PT, INR,  in the last 72 hours Microbiology: Recent Results (from the past 240 hour(s))  CULTURE, BLOOD (ROUTINE X 2)     Status: None   Collection Time    09/19/13  8:49 PM      Result Value Ref Range Status   Specimen Description BLOOD ARM LEFT   Final   Special Requests BOTTLES DRAWN AEROBIC AND ANAEROBIC 10CC   Final   Culture  Setup Time     Final   Value: 09/20/2013 04:15     Performed at [facility]   Culture     Final   Value:        BLOOD CULTURE RECEIVED NO GROWTH TO DATE CULTURE WILL BE HELD FOR 5 DAYS BEFORE ISSUING A FINAL NEGATIVE REPORT     Performed at [facility]   Report Status PENDING   Incomplete  CULTURE, BLOOD (ROUTINE X 2)     Status: None   Collection Time    09/19/13  9:00 PM      Result Value Ref Range Status     Specimen Description BLOOD HAND RIGHT   Final   Special Requests BOTTLES DRAWN AEROBIC ONLY 10CC   Final   Culture  Setup Time     Final   Value: 09/20/2013 04:14     Performed at [facility]   Culture     Final   Value:        BLOOD CULTURE RECEIVED NO GROWTH TO DATE CULTURE WILL BE HELD FOR 5 DAYS BEFORE ISSUING A FINAL NEGATIVE REPORT     Performed at [facility]   Report Status PENDING   Incomplete  URINE CULTURE     Status: None   Collection Time    09/20/13  8:35 AM      Result Value Ref Range Status   Specimen Description URINE, RANDOM   Final   Special Requests NONE   Final   Culture  Setup Time     Final   Value: 09/20/2013 14:41     Performed at [facility] PENDING   Incomplete   Culture     Final   Value: Culture reincubated for better growth     Performed at [facility]   Report Status PENDING   Incomplete  BODY FLUID CULTURE     Status: None   Collection Time    09/20/13  2:58 PM      Result Value Ref Range Status   Specimen Description FLUID RIGHT PLEURAL   Final   Special Requests NONE   Final   Gram Stain     Final   Value: NO WBC SEEN     NO ORGANISMS SEEN     Performed at [facility] PENDING  Incomplete   Report Status PENDING   Incomplete     BRIEF HOSPITAL COURSE:  Right Sided Pleural Effusion  -etiology not known-however suspicion for malignancy. Underwent diagnostic thoracocentesis by IR on 5/22, consistent with exudate, high suspicion for malignancy-cultures negative so far. Cytology pending. Patient not overtly SOB, ambulating in the room and in the hall without difficulty.  Since waiting for just cytology and basically asymptomatic, will discharge and have further work up to be done in the outpatient setting. Although was started on IV Rocephin and Zithromax, no indication of PNA or parapneumonic effusion, and therefore discontinued. Spoke with Dr Annamaria Boots this am, who  recommended  touching base with cardiothoracic surgery regarding VATS-I did speak with Dr Roxy Manns over the phone, who suggested that no procedures were indicated till Cytology results came back. He suggested that patient or family call their office on Tuesday (as long weekend). Long d/w patient,spouse, sons,daughters at bedside, explained that are just waiting for cytology to come back, and that further work up would depend on cytology results. They were understanding, patient preferred to wait for cytology at home, and not remain hospitalized. His family will keep a close eye on him, and seek immediate medical attention if his condition changes-patient's spouse is a Therapist, sports and daughter is a Higher education careers adviser.They were all agreeable with the above outlined plan  Hematuria  -CT Chest shows 2.1 cm exophytic right renal complex cyst or solid mass-?leading to above-if malignant  -MRI Abdomen showed simple cysts-depending on results. UA does confirm microscopic hematuria, will likely need further outpatient Urology evaluation  HYPERLIPIDEMIA  -c/w Statin   HYPERTENSION  -stable with Atenolol   BPH  -c/w Flomax and Proscar  -has had prior prostate surgery and ?Radiation  TODAY-DAY OF DISCHARGE:  Subjective:   Mory Herrman today has no headache,no chest abdominal pain,no new weakness tingling or numbness, feels much better wants to go home today.   Objective:   Blood pressure 132/74, pulse 64, temperature 98.1 F (36.7 C), temperature source Oral, resp. rate 18, height 5\' 8"  (1.727 m), weight 77.3 kg (170 lb 6.7 oz), SpO2 94.00%. No intake or output data in the 24 hours ending 09/21/13 1246 Filed Weights   09/19/13 1746 09/19/13 2257  Weight: 78.019 kg (172 lb) 77.3 kg (170 lb 6.7 oz)    Exam Awake Alert, Oriented *3, No new F.N deficits, Normal affect [place].AT,PERRAL Supple Neck,No JVD, No cervical lymphadenopathy appriciated.  Symmetrical Chest wall movement,Decreased air movement in the right base area-but  CTAB RRR,No Gallops,Rubs or new Murmurs, No Parasternal Heave +ve B.Sounds, Abd Soft, Non tender, No organomegaly appriciated, No rebound -guarding or rigidity. No Cyanosis, Clubbing or edema, No new Rash or bruise  DISCHARGE CONDITION: Stable  DISPOSITION: Home  DISCHARGE INSTRUCTIONS:    Activity:  As tolerated  Diet recommendation: Heart Healthy diet      Discharge Instructions   Call MD for:  difficulty breathing, headache or visual disturbances    Complete by:  As directed      Diet - low sodium heart healthy    Complete by:  As directed      Increase activity slowly    Complete by:  As directed            Follow-up Information   Follow up with Rexene Alberts, MD. Call on 09/24/2013. (to make appointment)    Specialty:  Cardiothoracic Surgery   Contact information:   [address] [place] [address] [phone]  Follow up with TODD,JEFFREY ALLEN, MD. Schedule an appointment as soon as possible for a visit in 1 week.   Specialty:  Family Medicine   Contact information:   [place] [address] [phone]      Total Time spent on discharge equals 45 minutes.  Signed: Jonetta Osgood 09/21/2013 12:46 PM  **Disclaimer: This note may have been dictated with voice recognition software. Similar sounding words can inadvertently be transcribed and this note may contain transcription errors which may not have been corrected upon publication of note.**

## 2013-09-23 LAB — URINE CULTURE: Colony Count: 25000

## 2013-09-24 ENCOUNTER — Ambulatory Visit
Admission: RE | Admit: 2013-09-24 | Discharge: 2013-09-24 | Disposition: A | Discharge status: Home / Self Care | Payer: Medicare Other | Source: Ambulatory Visit | Attending: Thoracic Surgery (Cardiothoracic Vascular Surgery) | Admitting: Thoracic Surgery (Cardiothoracic Vascular Surgery)

## 2013-09-24 ENCOUNTER — Other Ambulatory Visit: Payer: Self-pay | Admitting: *Deleted

## 2013-09-24 ENCOUNTER — Other Ambulatory Visit: Payer: Self-pay | Admitting: Thoracic Surgery (Cardiothoracic Vascular Surgery)

## 2013-09-24 ENCOUNTER — Encounter (HOSPITAL_COMMUNITY): Payer: Self-pay | Admitting: *Deleted

## 2013-09-24 ENCOUNTER — Other Ambulatory Visit: Payer: Self-pay

## 2013-09-24 ENCOUNTER — Encounter: Payer: Self-pay | Admitting: Thoracic Surgery (Cardiothoracic Vascular Surgery)

## 2013-09-24 ENCOUNTER — Ambulatory Visit (INDEPENDENT_AMBULATORY_CARE_PROVIDER_SITE_OTHER): Payer: Medicare Other | Admitting: Thoracic Surgery (Cardiothoracic Vascular Surgery)

## 2013-09-24 VITALS — BP 130/78 | HR 68 | Resp 20 | Ht 68.0 in | Wt 167.0 lb

## 2013-09-24 DIAGNOSIS — J90 Pleural effusion, not elsewhere classified: Secondary | ICD-10-CM

## 2013-09-24 LAB — BODY FLUID CULTURE
Culture: NO GROWTH
Gram Stain: NONE SEEN

## 2013-09-24 MED ORDER — CEFUROXIME SODIUM 1.5 G IJ SOLR
1.5000 g | INTRAMUSCULAR | Status: AC
  Administered 2013-09-25: 1.5 g via INTRAVENOUS

## 2013-09-24 NOTE — Progress Notes (Signed)
PCP is TODD,JEFFREY [name], MD Referring Provider is Ghimire, Henreitta Leber, MD  Chief Complaint  Patient presents with  . Pleural Effusion    Surgical eval, CXR    HPI: 80 year old gentleman sent for consultation regarding a right pleural effusion.  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 He is a lifelong nonsmoker. 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  He was in his usual state of health until about 2-3 months ago. The first thing he noticed was soreness in his right shoulder. This was not pursued by any trauma. This lasted for about 2-3 weeks. He developed a cough and some shortness of breath. He saw Dr. Sherren Mocha and treat him for possible viral pneumonia with doxycycline, steroids, and cough suppressants. Despite treatment he had progression of his shortness of breath and was admitted to the hospital last Thursday. He was found to have a large right pleural effusion. He underwent thoracentesis on Friday. 1.2 L of fluid was evacuated. It was exudative. Cultures have been no growth to this point. He did have a symptomatic relief after thoracentesis for 2 days. Then yesterday he noticed worsening shortness of breath again. His wife called the office this morning asking that he be seen today.  He denies weight loss, fever, chills, diaphoresis. He does have a right sided pleuritic chest and back pain. He does not had any anginal-type pain. He has no history of heart disease. He's had a nonproductive cough. He has not had any weight loss. He was very active up until the time he became ill  Past Medical History  Diagnosis Date  . Personal history of contact with and (suspected) exposure to asbestos   . Unspecified essential hypertension   . Other and unspecified hyperlipidemia   . Esophageal reflux   . Diverticulosis of colon (without mention of hemorrhage)   . Elevated prostate specific  antigen (PSA)   . Osteoarthrosis, unspecified whether generalized or localized, unspecified site   . Anxiety state, unspecified   . Rectal mass     Past Surgical History  Procedure Laterality Date  . Inguinal hernia repair      left    Family History  Problem Relation Age of Onset  . Coronary artery disease Father   . Coronary artery disease Mother   . Heart attack Brother   . Colon cancer Father     ? not exactly sure    Social History History  Substance Use Topics  . Smoking status: Never Smoker   . Smokeless tobacco: Not on file  . Alcohol Use: No    Current Outpatient Prescriptions  Medication Sig Dispense Refill  . aspirin 81 MG tablet Take 81 mg by mouth daily.        Marland Kitchen atenolol (TENORMIN) 25 MG tablet Take 25 mg by mouth daily.      Marland Kitchen EPINEPHrine (EPIPEN 2-PAK) 0.3 mg/0.3 mL DEVI Inject 0.3 mLs (0.3 mg total) into the muscle once.  1 Device  1  . esomeprazole (NEXIUM) 20 MG capsule Take 20 mg by mouth daily at 12 noon.      . finasteride (PROSCAR) 5 MG tablet Take 1 tablet (5 mg total) by mouth daily.  100 tablet  3  . fish oil-omega-3 fatty acids 1000 MG capsule Take 2 g by mouth 2 (two) times daily.       Marland Kitchen glucosamine-chondroitin  500-400 MG tablet Take 1 tablet by mouth 2 (two) times daily.       Marland Kitchen HYDROcodone-homatropine (HYCODAN) 5-1.5 MG/5ML syrup Take 5 mLs by mouth every 8 (eight) hours as needed for cough.  240 mL  0  . ibuprofen (ADVIL,MOTRIN) 200 MG tablet Take 400 mg by mouth every 8 (eight) hours as needed for moderate pain.      . multivitamin (THERAGRAN) per tablet Take 1 tablet by mouth daily.        . pravastatin (PRAVACHOL) 40 MG tablet Take 1 tablet (40 mg total) by mouth every other day.  100 tablet  3  . terazosin (HYTRIN) 10 MG capsule Take 1 capsule (10 mg total) by mouth at bedtime.  100 capsule  3   No current facility-administered medications for this visit.    No Known Allergies  Review of Systems  Constitutional: Positive for  activity change, fatigue (Decreased energy) and unexpected weight change (Has gained 3 pounds in 3 months). Negative for fever and appetite change.  HENT: Negative.   Respiratory: Positive for cough (Nonproductive), shortness of breath and wheezing.   Cardiovascular: Positive for chest pain (Pleuritic right back and chest pain, no angina).  Gastrointestinal: Positive for abdominal distention.  Genitourinary:       BPH  Musculoskeletal:       Right shoulder pain  Psychiatric/Behavioral: The patient is nervous/anxious.     BP 130/78  Pulse 68  Resp 20  Ht 5\' 8"  (1.727 m)  Wt 167 lb (75.751 kg)  BMI 25.40 kg/m2  SpO2 96% Physical Exam  Vitals reviewed. Constitutional: He is oriented to person, place, and time. He appears well-developed and well-nourished. No distress.  HENT:  Head: Normocephalic and atraumatic.  Eyes: EOM are normal. Pupils are equal, round, and reactive to light.  Neck: Neck supple. No thyromegaly present.  Cardiovascular: Normal rate, regular rhythm and normal heart sounds.  Exam reveals no gallop and no friction rub.   No murmur heard. Pulmonary/Chest: Effort normal. He has no wheezes.  Diminished breath sounds and dullness to percussion lower half of right chest  Abdominal: Soft. He exhibits no distension.  Musculoskeletal: He exhibits no edema.  Lymphadenopathy:    He has no cervical adenopathy.  Neurological: He is alert and oriented to person, place, and time. No cranial nerve deficit.  Skin: Skin is warm and dry.     Diagnostic Tests: CT chest CT CHEST WITHOUT CONTRAST  TECHNIQUE:  Multidetector CT imaging of the chest was performed following the  standard protocol without IV contrast.  COMPARISON: Chest radiographs obtained earlier today.  FINDINGS:  Large right pleural effusion. Compressive atelectasis of the  adjacent portions of the right lung. No visible pleural thickening  or pleural based masses. Atheromatous coronary artery   calcifications. Minimal linear atelectasis or scarring at the left  lung base. No lung nodules or enlarged lymph nodes seen.  Partially included left renal cysts. 2.1 x 1.8 cm exophytic medial  upper right renal mass measuring 38 Hounsfield units in density on  image number 65. Mild thoracic spine degenerative changes.  IMPRESSION:  1. Large right pleural effusion.  2. Compressive atelectasis of the right lung, most pronounced  involving the lower lobe.  3. Atheromatous coronary artery calcifications.  4. 2.1 cm exophytic right renal complex cyst or solid mass. If the  patient's renal function allows it, elective pre and postcontrast CT  or magnetic resonance imaging of the kidneys would be recommended.  Electronically Signed  By:  Enrique Sack M.D.  On: 09/19/2013 21:54   Impression: 80 year old gentleman with a large exudative right pleural effusion. Differential diagnosis includes infection, inflammatory disease, and malignancy. The effusion is exudative and he has had a rapid return of symptoms after a thoracentesis. I think at this point the same to do is to proceed with right video-assisted thoracoscopy to completely drain the effusion and reexpand the lung. It is unclear from the CT whether a decortication will be necessary, but it is a possibility.  I explained the procedure to Mr. and Mrs. Belloso. They understand the general nature of the procedure, the need for general anesthesia, the incisions to be used, expected hospital stay, overall recovery. They understand this is both a diagnostic and therapeutic procedure. They do understand that no definitive guarantee can be given that the effusion won't recur. We discussed the indications, risks, benefits, and alternatives. They understand the risks include, but are not limited to death, MI, DVT, PE, bleeding, possible need for transfusion, infection, prolonged air leak, irregular heart rhythms, as well as the possibility of other  unforeseeable complications. He accepts the risks and wishes to proceed.  Plan: Right VATS, drainage of effusion, possible decortication Wednesday 5/27

## 2013-09-25 ENCOUNTER — Encounter (HOSPITAL_COMMUNITY): Payer: Medicare Other | Admitting: Certified Registered Nurse Anesthetist

## 2013-09-25 ENCOUNTER — Inpatient Hospital Stay (HOSPITAL_COMMUNITY): Payer: Medicare Other

## 2013-09-25 ENCOUNTER — Inpatient Hospital Stay (HOSPITAL_COMMUNITY)
Admission: RE | Admit: 2013-09-25 | Discharge: 2013-09-30 | DRG: 167 | Disposition: A | Discharge status: Home / Self Care | Payer: Medicare Other | Source: Ambulatory Visit | Attending: Thoracic Surgery (Cardiothoracic Vascular Surgery) | Admitting: Thoracic Surgery (Cardiothoracic Vascular Surgery)

## 2013-09-25 ENCOUNTER — Encounter (HOSPITAL_COMMUNITY): Payer: Self-pay | Admitting: *Deleted

## 2013-09-25 ENCOUNTER — Encounter (HOSPITAL_COMMUNITY)
Admission: RE | Disposition: A | Discharge status: Home / Self Care | Payer: Self-pay | Source: Ambulatory Visit | Attending: Thoracic Surgery (Cardiothoracic Vascular Surgery)

## 2013-09-25 ENCOUNTER — Inpatient Hospital Stay (HOSPITAL_COMMUNITY): Payer: Medicare Other | Admitting: Certified Registered Nurse Anesthetist

## 2013-09-25 DIAGNOSIS — I2510 Atherosclerotic heart disease of native coronary artery without angina pectoris: Secondary | ICD-10-CM | POA: Diagnosis present

## 2013-09-25 DIAGNOSIS — F411 Generalized anxiety disorder: Secondary | ICD-10-CM | POA: Diagnosis present

## 2013-09-25 DIAGNOSIS — Z800 Family history of malignant neoplasm of digestive organs: Secondary | ICD-10-CM

## 2013-09-25 DIAGNOSIS — K219 Gastro-esophageal reflux disease without esophagitis: Secondary | ICD-10-CM | POA: Diagnosis present

## 2013-09-25 DIAGNOSIS — Z77090 Contact with and (suspected) exposure to asbestos: Secondary | ICD-10-CM

## 2013-09-25 DIAGNOSIS — I10 Essential (primary) hypertension: Secondary | ICD-10-CM | POA: Diagnosis present

## 2013-09-25 DIAGNOSIS — J9819 Other pulmonary collapse: Secondary | ICD-10-CM | POA: Diagnosis present

## 2013-09-25 DIAGNOSIS — Z7982 Long term (current) use of aspirin: Secondary | ICD-10-CM

## 2013-09-25 DIAGNOSIS — M1990 Unspecified osteoarthritis, unspecified site: Secondary | ICD-10-CM | POA: Diagnosis present

## 2013-09-25 DIAGNOSIS — E785 Hyperlipidemia, unspecified: Secondary | ICD-10-CM | POA: Diagnosis present

## 2013-09-25 DIAGNOSIS — N400 Enlarged prostate without lower urinary tract symptoms: Secondary | ICD-10-CM | POA: Diagnosis present

## 2013-09-25 DIAGNOSIS — K5730 Diverticulosis of large intestine without perforation or abscess without bleeding: Secondary | ICD-10-CM | POA: Diagnosis present

## 2013-09-25 DIAGNOSIS — J90 Pleural effusion, not elsewhere classified: Secondary | ICD-10-CM

## 2013-09-25 DIAGNOSIS — Z8249 Family history of ischemic heart disease and other diseases of the circulatory system: Secondary | ICD-10-CM

## 2013-09-25 DIAGNOSIS — R972 Elevated prostate specific antigen [PSA]: Secondary | ICD-10-CM | POA: Diagnosis present

## 2013-09-25 HISTORY — DX: Shortness of breath: R06.02

## 2013-09-25 HISTORY — PX: VIDEO ASSISTED THORACOSCOPY (VATS)/DECORTICATION: SHX6171

## 2013-09-25 HISTORY — PX: PLEURAL EFFUSION DRAINAGE: SHX5099

## 2013-09-25 LAB — SURGICAL PCR SCREEN
MRSA, PCR: POSITIVE — AB
Staphylococcus aureus: POSITIVE — AB

## 2013-09-25 LAB — PROTIME-INR
INR: 0.98 (ref 0.00–1.49)
Prothrombin Time: 12.8 seconds (ref 11.6–15.2)

## 2013-09-25 LAB — BLOOD GAS, ARTERIAL
Acid-base deficit: 0.4 mmol/L (ref 0.0–2.0)
BICARBONATE: 23.2 meq/L (ref 20.0–24.0)
Drawn by: 344381
FIO2: 0.21 %
O2 Saturation: 95.2 %
PH ART: 7.438 (ref 7.350–7.450)
PO2 ART: 72.1 mmHg — AB (ref 80.0–100.0)
Patient temperature: 98.6
TCO2: 24.3 mmol/L (ref 0–100)
pCO2 arterial: 34.9 mmHg — ABNORMAL LOW (ref 35.0–45.0)

## 2013-09-25 LAB — BODY FLUID CELL COUNT WITH DIFFERENTIAL
EOS FL: 0 %
Lymphs, Fluid: 86 %
Monocyte-Macrophage-Serous Fluid: 1 % — ABNORMAL LOW (ref 50–90)
NEUTROPHIL FLUID: 13 % (ref 0–25)
Other Cells, Fluid: 0 %
WBC FLUID: 537 uL (ref 0–1000)

## 2013-09-25 LAB — ABO/RH: ABO/RH(D): A NEG

## 2013-09-25 LAB — TYPE AND SCREEN
ABO/RH(D): A NEG
Antibody Screen: NEGATIVE

## 2013-09-25 LAB — APTT: aPTT: 28 seconds (ref 24–37)

## 2013-09-25 SURGERY — VIDEO ASSISTED THORACOSCOPY (VATS)/DECORTICATION
Anesthesia: General | Site: Chest | Laterality: Right

## 2013-09-25 MED ORDER — STERILE WATER FOR INJECTION IJ SOLN
INTRAMUSCULAR | Status: AC
  Filled 2013-09-25: qty 10

## 2013-09-25 MED ORDER — HYDROMORPHONE HCL PF 1 MG/ML IJ SOLN
0.2500 mg | INTRAMUSCULAR | Status: DC | PRN
  Administered 2013-09-25: 0.5 mg via INTRAVENOUS

## 2013-09-25 MED ORDER — PROPOFOL 10 MG/ML IV BOLUS
INTRAVENOUS | Status: DC | PRN
  Administered 2013-09-25: 110 mg via INTRAVENOUS
  Administered 2013-09-25: 50 mg via INTRAVENOUS

## 2013-09-25 MED ORDER — PROPOFOL 10 MG/ML IV BOLUS
INTRAVENOUS | Status: AC
  Filled 2013-09-25: qty 20

## 2013-09-25 MED ORDER — FINASTERIDE 5 MG PO TABS
5.0000 mg | ORAL_TABLET | Freq: Every day | ORAL | Status: DC
  Administered 2013-09-26 – 2013-09-30 (×5): 5 mg via ORAL
  Filled 2013-09-25 (×6): qty 1

## 2013-09-25 MED ORDER — FENTANYL CITRATE 0.05 MG/ML IJ SOLN
INTRAMUSCULAR | Status: AC
  Filled 2013-09-25: qty 2

## 2013-09-25 MED ORDER — ACETAMINOPHEN 160 MG/5ML PO SOLN
1000.0000 mg | Freq: Four times a day (QID) | ORAL | Status: DC
  Administered 2013-09-26 – 2013-09-28 (×7): 1000 mg via ORAL
  Filled 2013-09-25 (×21): qty 40

## 2013-09-25 MED ORDER — ACETAMINOPHEN 500 MG PO TABS
1000.0000 mg | ORAL_TABLET | Freq: Four times a day (QID) | ORAL | Status: DC
  Administered 2013-09-28 – 2013-09-30 (×5): 1000 mg via ORAL
  Filled 2013-09-25 (×17): qty 2

## 2013-09-25 MED ORDER — ROCURONIUM BROMIDE 100 MG/10ML IV SOLN
INTRAVENOUS | Status: DC | PRN
  Administered 2013-09-25: 50 mg via INTRAVENOUS

## 2013-09-25 MED ORDER — LACTATED RINGERS IV SOLN
INTRAVENOUS | Status: DC
  Administered 2013-09-25 (×2): via INTRAVENOUS

## 2013-09-25 MED ORDER — OXYCODONE HCL 5 MG/5ML PO SOLN: 5.0000 mg | Freq: Once | ORAL | Status: DC | PRN

## 2013-09-25 MED ORDER — NALOXONE HCL 0.4 MG/ML IJ SOLN: 0.4000 mg | INTRAMUSCULAR | Status: DC | PRN

## 2013-09-25 MED ORDER — ONDANSETRON HCL 4 MG/2ML IJ SOLN: 4.0000 mg | Freq: Four times a day (QID) | INTRAMUSCULAR | Status: DC | PRN

## 2013-09-25 MED ORDER — DIPHENHYDRAMINE HCL 50 MG/ML IJ SOLN
12.5000 mg | Freq: Four times a day (QID) | INTRAMUSCULAR | Status: DC | PRN
Start: 2013-09-25 — End: 2013-09-28

## 2013-09-25 MED ORDER — ONDANSETRON HCL 4 MG/2ML IJ SOLN
INTRAMUSCULAR | Status: DC | PRN
  Administered 2013-09-25: 4 mg via INTRAVENOUS

## 2013-09-25 MED ORDER — ARTIFICIAL TEARS OP OINT
TOPICAL_OINTMENT | OPHTHALMIC | Status: AC
  Filled 2013-09-25: qty 3.5

## 2013-09-25 MED ORDER — FENTANYL CITRATE 0.05 MG/ML IJ SOLN
INTRAMUSCULAR | Status: AC
  Filled 2013-09-25: qty 5

## 2013-09-25 MED ORDER — LIDOCAINE HCL (CARDIAC) 20 MG/ML IV SOLN
INTRAVENOUS | Status: AC
  Filled 2013-09-25: qty 5

## 2013-09-25 MED ORDER — BISACODYL 5 MG PO TBEC
10.0000 mg | DELAYED_RELEASE_TABLET | Freq: Every day | ORAL | Status: DC
  Administered 2013-09-26 – 2013-09-27 (×2): 10 mg via ORAL
  Administered 2013-09-28: 5 mg via ORAL
  Administered 2013-09-30: 10 mg via ORAL
  Filled 2013-09-25 (×4): qty 2

## 2013-09-25 MED ORDER — SODIUM CHLORIDE 0.9 % IJ SOLN: 9.0000 mL | INTRAMUSCULAR | Status: DC | PRN

## 2013-09-25 MED ORDER — ATENOLOL 25 MG PO TABS
25.0000 mg | ORAL_TABLET | Freq: Every day | ORAL | Status: DC
  Administered 2013-09-26 – 2013-09-30 (×5): 25 mg via ORAL
  Filled 2013-09-25 (×6): qty 1

## 2013-09-25 MED ORDER — LIDOCAINE HCL (CARDIAC) 20 MG/ML IV SOLN
INTRAVENOUS | Status: DC | PRN
Start: 2013-09-25 — End: 2013-09-25
  Administered 2013-09-25: 60 mg via INTRAVENOUS

## 2013-09-25 MED ORDER — FENTANYL CITRATE 0.05 MG/ML IJ SOLN
INTRAMUSCULAR | Status: DC | PRN
  Administered 2013-09-25 (×2): 50 ug via INTRAVENOUS
  Administered 2013-09-25: 200 ug via INTRAVENOUS
  Administered 2013-09-25: 50 ug via INTRAVENOUS

## 2013-09-25 MED ORDER — MEPERIDINE HCL 25 MG/ML IJ SOLN
INTRAMUSCULAR | Status: AC
  Filled 2013-09-25: qty 1

## 2013-09-25 MED ORDER — MUPIROCIN 2 % EX OINT
TOPICAL_OINTMENT | CUTANEOUS | Status: AC
  Administered 2013-09-25: 1
  Filled 2013-09-25: qty 22

## 2013-09-25 MED ORDER — PROMETHAZINE HCL 25 MG/ML IJ SOLN
6.2500 mg | INTRAMUSCULAR | Status: DC | PRN
  Administered 2013-09-25: 6.25 mg via INTRAVENOUS

## 2013-09-25 MED ORDER — POTASSIUM CHLORIDE 10 MEQ/50ML IV SOLN: 10.0000 meq | Freq: Every day | INTRAVENOUS | Status: DC | PRN

## 2013-09-25 MED ORDER — PROMETHAZINE HCL 25 MG/ML IJ SOLN
INTRAMUSCULAR | Status: AC
  Filled 2013-09-25: qty 1

## 2013-09-25 MED ORDER — ONDANSETRON HCL 4 MG/2ML IJ SOLN
INTRAMUSCULAR | Status: AC
  Filled 2013-09-25: qty 2

## 2013-09-25 MED ORDER — ROCURONIUM BROMIDE 50 MG/5ML IV SOLN
INTRAVENOUS | Status: AC
  Filled 2013-09-25: qty 1

## 2013-09-25 MED ORDER — FENTANYL 10 MCG/ML IV SOLN
INTRAVENOUS | Status: DC
  Administered 2013-09-25: 18:00:00 via INTRAVENOUS
  Administered 2013-09-26: 20 ug via INTRAVENOUS
  Administered 2013-09-26: 30 ug via INTRAVENOUS
  Administered 2013-09-26: 10 ug via INTRAVENOUS
  Administered 2013-09-26: 30 ug via INTRAVENOUS
  Administered 2013-09-26 – 2013-09-27 (×2): 10 ug via INTRAVENOUS
  Administered 2013-09-28: 20 ug via INTRAVENOUS
  Filled 2013-09-25 (×2): qty 50

## 2013-09-25 MED ORDER — PROPOFOL 10 MG/ML IV BOLUS
INTRAVENOUS | Status: AC
Start: 2013-09-25 — End: 2013-09-25
  Filled 2013-09-25: qty 20

## 2013-09-25 MED ORDER — CEFUROXIME SODIUM 1.5 G IJ SOLR
1.5000 g | Freq: Two times a day (BID) | INTRAMUSCULAR | Status: AC
  Administered 2013-09-26 (×2): 1.5 g via INTRAVENOUS
  Filled 2013-09-25 (×2): qty 1.5

## 2013-09-25 MED ORDER — NEOSTIGMINE METHYLSULFATE 10 MG/10ML IV SOLN
INTRAVENOUS | Status: AC
  Filled 2013-09-25: qty 1

## 2013-09-25 MED ORDER — TRAMADOL HCL 50 MG PO TABS
50.0000 mg | ORAL_TABLET | Freq: Four times a day (QID) | ORAL | Status: DC | PRN
  Administered 2013-09-26 (×2): 100 mg via ORAL
  Administered 2013-09-28: 50 mg via ORAL
  Filled 2013-09-25 (×3): qty 2

## 2013-09-25 MED ORDER — OXYCODONE HCL 5 MG PO TABS: 5.0000 mg | ORAL_TABLET | ORAL | Status: DC | PRN

## 2013-09-25 MED ORDER — TALC 5 G PL SUSR
INTRAPLEURAL | Status: AC
  Filled 2013-09-25: qty 5

## 2013-09-25 MED ORDER — KETOROLAC TROMETHAMINE 15 MG/ML IJ SOLN
INTRAMUSCULAR | Status: AC
  Filled 2013-09-25: qty 1

## 2013-09-25 MED ORDER — TERAZOSIN HCL 5 MG PO CAPS
10.0000 mg | ORAL_CAPSULE | Freq: Every day | ORAL | Status: DC
  Administered 2013-09-26 – 2013-09-29 (×5): 10 mg via ORAL
  Filled 2013-09-25 (×6): qty 2

## 2013-09-25 MED ORDER — PHENYLEPHRINE HCL 10 MG/ML IJ SOLN
10.0000 mg | INTRAVENOUS | Status: DC | PRN
  Administered 2013-09-25: 10 ug/min via INTRAVENOUS

## 2013-09-25 MED ORDER — PANTOPRAZOLE SODIUM 40 MG PO TBEC
40.0000 mg | DELAYED_RELEASE_TABLET | Freq: Every day | ORAL | Status: DC
  Administered 2013-09-26 – 2013-09-30 (×5): 40 mg via ORAL
  Filled 2013-09-25 (×4): qty 1

## 2013-09-25 MED ORDER — SIMVASTATIN 20 MG PO TABS
20.0000 mg | ORAL_TABLET | ORAL | Status: DC
  Administered 2013-09-27 – 2013-09-29 (×2): 20 mg via ORAL
  Filled 2013-09-25 (×3): qty 1

## 2013-09-25 MED ORDER — ASPIRIN 81 MG PO TABS: 81.0000 mg | ORAL_TABLET | Freq: Every day | ORAL | Status: DC

## 2013-09-25 MED ORDER — KCL IN DEXTROSE-NACL 20-5-0.45 MEQ/L-%-% IV SOLN
INTRAVENOUS | Status: DC
  Administered 2013-09-25 – 2013-09-27 (×2): via INTRAVENOUS
  Filled 2013-09-25 (×7): qty 1000

## 2013-09-25 MED ORDER — GLYCOPYRROLATE 0.2 MG/ML IJ SOLN
INTRAMUSCULAR | Status: DC | PRN
  Administered 2013-09-25: .4 mg via INTRAVENOUS

## 2013-09-25 MED ORDER — GLYCOPYRROLATE 0.2 MG/ML IJ SOLN
INTRAMUSCULAR | Status: AC
  Filled 2013-09-25: qty 2

## 2013-09-25 MED ORDER — TERAZOSIN HCL 5 MG PO CAPS: 10.0000 mg | ORAL_CAPSULE | Freq: Every day | ORAL | Status: DC

## 2013-09-25 MED ORDER — LACTATED RINGERS IV SOLN
INTRAVENOUS | Status: DC
  Administered 2013-09-25: 10:00:00 via INTRAVENOUS

## 2013-09-25 MED ORDER — ONDANSETRON HCL 4 MG/2ML IJ SOLN
4.0000 mg | Freq: Four times a day (QID) | INTRAMUSCULAR | Status: DC | PRN
  Administered 2013-09-25: 4 mg via INTRAVENOUS
  Filled 2013-09-25: qty 2

## 2013-09-25 MED ORDER — SENNOSIDES-DOCUSATE SODIUM 8.6-50 MG PO TABS
1.0000 | ORAL_TABLET | Freq: Every day | ORAL | Status: DC
  Administered 2013-09-26 – 2013-09-27 (×2): 1 via ORAL
  Filled 2013-09-25 (×6): qty 1

## 2013-09-25 MED ORDER — HYDROMORPHONE HCL PF 1 MG/ML IJ SOLN
INTRAMUSCULAR | Status: AC
  Administered 2013-09-25: 0.5 mg via INTRAVENOUS
  Filled 2013-09-25: qty 1

## 2013-09-25 MED ORDER — DIPHENHYDRAMINE HCL 12.5 MG/5ML PO ELIX
12.5000 mg | ORAL_SOLUTION | Freq: Four times a day (QID) | ORAL | Status: DC | PRN
  Filled 2013-09-25: qty 5

## 2013-09-25 MED ORDER — MEPERIDINE HCL 25 MG/ML IJ SOLN: 6.2500 mg | INTRAMUSCULAR | Status: DC | PRN

## 2013-09-25 MED ORDER — LACTATED RINGERS IV SOLN
INTRAVENOUS | Status: DC | PRN
  Administered 2013-09-25 (×2): via INTRAVENOUS

## 2013-09-25 MED ORDER — VECURONIUM BROMIDE 10 MG IV SOLR
INTRAVENOUS | Status: AC
  Filled 2013-09-25: qty 10

## 2013-09-25 MED ORDER — CEFUROXIME SODIUM 1.5 G IJ SOLR
INTRAMUSCULAR | Status: AC
  Filled 2013-09-25: qty 1.5

## 2013-09-25 MED ORDER — ASPIRIN 81 MG PO CHEW
81.0000 mg | CHEWABLE_TABLET | Freq: Every day | ORAL | Status: DC
  Administered 2013-09-26 – 2013-09-30 (×5): 81 mg via ORAL
  Filled 2013-09-25 (×5): qty 1

## 2013-09-25 MED ORDER — NEOSTIGMINE METHYLSULFATE 10 MG/10ML IV SOLN
INTRAVENOUS | Status: DC | PRN
  Administered 2013-09-25: 3 mg via INTRAVENOUS

## 2013-09-25 MED ORDER — OXYCODONE HCL 5 MG PO TABS: 5.0000 mg | ORAL_TABLET | Freq: Once | ORAL | Status: DC | PRN

## 2013-09-25 MED ORDER — KETOROLAC TROMETHAMINE 15 MG/ML IJ SOLN
15.0000 mg | Freq: Four times a day (QID) | INTRAMUSCULAR | Status: DC
Start: 2013-09-25 — End: 2013-09-26
  Administered 2013-09-25 – 2013-09-26 (×3): 15 mg via INTRAVENOUS
  Filled 2013-09-25 (×3): qty 1

## 2013-09-25 SURGICAL SUPPLY — 72 items
ADH SKN CLS APL DERMABOND .7 (GAUZE/BANDAGES/DRESSINGS) ×2
APL SRG 22X2 LUM MLBL SLNT (VASCULAR PRODUCTS)
APPLICATOR TIP EXT COSEAL (VASCULAR PRODUCTS) IMPLANT
BAG SPEC RTRVL LRG 6X4 10 (ENDOMECHANICALS)
CANISTER SUCTION 2500CC (MISCELLANEOUS) ×4 IMPLANT
CATH KIT ON Q 5IN SLV (PAIN MANAGEMENT) IMPLANT
CATH ROBINSON RED A/P 18FR (CATHETERS) ×4 IMPLANT
CATH THORACIC 28FR (CATHETERS) IMPLANT
CATH THORACIC 28FR RT ANG (CATHETERS) IMPLANT
CATH THORACIC 36FR (CATHETERS) IMPLANT
CATH THORACIC 36FR RT ANG (CATHETERS) IMPLANT
CLEANER TIP ELECTROSURG 2X2 (MISCELLANEOUS) ×4 IMPLANT
CLIP TI MEDIUM 6 (CLIP) ×4 IMPLANT
CONN Y 3/8X3/8X3/8  BEN (MISCELLANEOUS)
CONN Y 3/8X3/8X3/8 BEN (MISCELLANEOUS) IMPLANT
CONT SPEC 4OZ CLIKSEAL STRL BL (MISCELLANEOUS) ×20 IMPLANT
COVER SURGICAL LIGHT HANDLE (MISCELLANEOUS) ×8 IMPLANT
DERMABOND ADVANCED (GAUZE/BANDAGES/DRESSINGS) ×2
DERMABOND ADVANCED .7 DNX12 (GAUZE/BANDAGES/DRESSINGS) ×1 IMPLANT
DRAIN CHANNEL 32F RND 10.7 FF (WOUND CARE) ×4 IMPLANT
DRAPE LAPAROSCOPIC ABDOMINAL (DRAPES) ×4 IMPLANT
DRAPE WARM FLUID 44X44 (DRAPE) ×8 IMPLANT
ELECT REM PT RETURN 9FT ADLT (ELECTROSURGICAL) ×4
ELECTRODE REM PT RTRN 9FT ADLT (ELECTROSURGICAL) ×2 IMPLANT
GLOVE BIO SURGEON STRL SZ 6 (GLOVE) ×9 IMPLANT
GLOVE BIOGEL PI IND STRL 6 (GLOVE) ×6 IMPLANT
GLOVE BIOGEL PI IND STRL 7.0 (GLOVE) ×2 IMPLANT
GLOVE BIOGEL PI INDICATOR 6 (GLOVE) ×6
GLOVE BIOGEL PI INDICATOR 7.0 (GLOVE) ×4
GLOVE SURG SIGNA 7.5 PF LTX (GLOVE) ×8 IMPLANT
GOWN STRL REUS W/ TWL LRG LVL3 (GOWN DISPOSABLE) ×5 IMPLANT
GOWN STRL REUS W/ TWL XL LVL3 (GOWN DISPOSABLE) ×2 IMPLANT
GOWN STRL REUS W/TWL LRG LVL3 (GOWN DISPOSABLE) ×12
GOWN STRL REUS W/TWL XL LVL3 (GOWN DISPOSABLE) ×4
HANDLE STAPLE ENDO GIA SHORT (STAPLE)
HEMOSTAT SURGICEL 2X14 (HEMOSTASIS) IMPLANT
KIT BASIN OR (CUSTOM PROCEDURE TRAY) ×4 IMPLANT
KIT ROOM TURNOVER OR (KITS) ×4 IMPLANT
NS IRRIG 1000ML POUR BTL (IV SOLUTION) ×8 IMPLANT
PACK CHEST (CUSTOM PROCEDURE TRAY) ×4 IMPLANT
PAD ARMBOARD 7.5X6 YLW CONV (MISCELLANEOUS) ×8 IMPLANT
POUCH ENDO CATCH II 15MM (MISCELLANEOUS) IMPLANT
POUCH SPECIMEN RETRIEVAL 10MM (ENDOMECHANICALS) IMPLANT
SEALANT PROGEL (MISCELLANEOUS) IMPLANT
SEALANT SURG COSEAL 4ML (VASCULAR PRODUCTS) IMPLANT
SEALANT SURG COSEAL 8ML (VASCULAR PRODUCTS) IMPLANT
SOLUTION ANTI FOG 6CC (MISCELLANEOUS) ×8 IMPLANT
SPONGE GAUZE 4X4 12PLY (GAUZE/BANDAGES/DRESSINGS) ×4 IMPLANT
STAPLER ENDO GIA 12 SHRT THIN (STAPLE) IMPLANT
STAPLER ENDO GIA 12MM SHORT (STAPLE) IMPLANT
SUT PROLENE 4 0 RB 1 (SUTURE)
SUT PROLENE 4-0 RB1 .5 CRCL 36 (SUTURE) IMPLANT
SUT SILK  1 MH (SUTURE) ×2
SUT SILK 1 MH (SUTURE) ×4 IMPLANT
SUT SILK 2 0SH CR/8 30 (SUTURE) IMPLANT
SUT SILK 3 0SH CR/8 30 (SUTURE) IMPLANT
SUT VIC AB 1 CTX 36 (SUTURE)
SUT VIC AB 1 CTX36XBRD ANBCTR (SUTURE) IMPLANT
SUT VIC AB 2-0 CTX 36 (SUTURE) IMPLANT
SUT VIC AB 3-0 MH 27 (SUTURE) IMPLANT
SUT VIC AB 3-0 X1 27 (SUTURE) IMPLANT
SUT VICRYL 2 TP 1 (SUTURE) IMPLANT
SYR BULB IRRIGATION 50ML (SYRINGE) ×2 IMPLANT
SYSTEM SAHARA CHEST DRAIN RE-I (WOUND CARE) ×4 IMPLANT
TAPE CLOTH 4X10 WHT NS (GAUZE/BANDAGES/DRESSINGS) ×4 IMPLANT
TIP APPLICATOR SPRAY EXTEND 16 (VASCULAR PRODUCTS) IMPLANT
TOWEL OR 17X24 6PK STRL BLUE (TOWEL DISPOSABLE) ×8 IMPLANT
TOWEL OR 17X26 10 PK STRL BLUE (TOWEL DISPOSABLE) ×8 IMPLANT
TRAY FOLEY CATH 16FRSI W/METER (SET/KITS/TRAYS/PACK) ×4 IMPLANT
TROCAR XCEL BLADELESS 5X75MML (TROCAR) ×4 IMPLANT
TUNNELER SHEATH ON-Q 11GX8 DSP (PAIN MANAGEMENT) IMPLANT
WATER STERILE IRR 1000ML POUR (IV SOLUTION) ×8 IMPLANT

## 2013-09-25 NOTE — Interval H&P Note (Signed)
History and Physical Interval Note:  09/25/2013 3:13 PM  Sean Mitchell.  has presented today for surgery, with the diagnosis of RIGHT PLEURAL EFFUSION  The various methods of treatment have been discussed with the patient and family. After consideration of risks, benefits and other options for treatment, the patient has consented to  Procedure(s): VIDEO ASSISTED THORACOSCOPY (VATS)/POSSIBLE DECORTICATION (Right) DRAINAGE OF PLEURAL EFFUSION (Right) as a surgical intervention .  The patient's history has been reviewed, patient examined, no change in status, stable for surgery.  I have reviewed the patient's chart and labs.  Questions were answered to the patient's satisfaction.     Melrose Nakayama

## 2013-09-25 NOTE — Anesthesia Postprocedure Evaluation (Signed)
  Anesthesia Post-op Note  Patient: Sean Mitchell.  Procedure(s) Performed: Procedure(s): VIDEO ASSISTED THORACOSCOPY (VATS)/POSSIBLE DECORTICATION (Right) DRAINAGE OF PLEURAL EFFUSION (Right)  Patient Location: PACU  Anesthesia Type:General  Level of Consciousness: awake  Airway and Oxygen Therapy: Patient Spontanous Breathing and Patient connected to nasal cannula oxygen  Post-op Pain: mild  Post-op Assessment: Post-op Vital signs reviewed  Post-op Vital Signs: Reviewed  Last Vitals:  Filed Vitals:   09/25/13 1900  BP: 131/69  Pulse: 57  Temp:   Resp: 13    Complications: No apparent anesthesia complications

## 2013-09-25 NOTE — Brief Op Note (Addendum)
09/25/2013  5:29 PM  PATIENT:  Sean Mitchell.  80 y.o. male  PRE-OPERATIVE DIAGNOSIS:  RIGHT PLEURAL EFFUSION  POST-OPERATIVE DIAGNOSIS:  RIGHT PLEURAL EFFUSION  PROCEDURE:   RIGHT VIDEO ASSISTED THORACOSCOPY   DRAINAGE OF PLEURAL EFFUSION  PLEURAL BIOPSIES  SURGEON:  Melrose Nakayama, MD  ASSISTANT: Suzzanne Cloud, PA-C  ANESTHESIA:   general  SPECIMEN:  Source of Specimen:  Pleural fluid, pleural biopsies  DISPOSITION OF SPECIMEN:  Pathology  DRAINS: 42 Blake drain  PATIENT CONDITION:  PACU - hemodynamically stable.  2.5 Liters of bloody fluid Diffuse infiltrative plaques on parietal pleural- suspicious appearance for mesothelioma clinically Frozen= inflammatory process with atypical cells not definitively malignant

## 2013-09-25 NOTE — H&P (Signed)
PCP is TODD,JEFFREY [name], MD Referring Provider is Ghimire, Henreitta Leber, MD    Chief Complaint   Patient presents with   .  Pleural Effusion       Surgical eval, CXR        HPI: 80 year old gentleman sent for consultation regarding a right pleural effusion.   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 He is a lifelong nonsmoker. 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   He was in his usual state of health until about 2-3 months ago. The first thing he noticed was soreness in his right shoulder. This was not pursued by any trauma. This lasted for about 2-3 weeks. He developed a cough and some shortness of breath. He saw Dr. Sherren Mocha and treat him for possible viral pneumonia with doxycycline, steroids, and cough suppressants. Despite treatment he had progression of his shortness of breath and was admitted to the hospital last Thursday. He was found to have a large right pleural effusion. He underwent thoracentesis on Friday. 1.2 L of fluid was evacuated. It was exudative. Cultures have been no growth to this point. He did have a symptomatic relief after thoracentesis for 2 days. Then yesterday he noticed worsening shortness of breath again. His wife called the office this morning asking that he be seen today.   He denies weight loss, fever, chills, diaphoresis. He does have a right sided pleuritic chest and back pain. He does not had any anginal-type pain. He has no history of heart disease. He's had a nonproductive cough. He has not had any weight loss. He was very active up until the time he became ill    Past Medical History   Diagnosis  Date   .  Personal history of contact with and (suspected) exposure to asbestos     .  Unspecified essential hypertension     .  Other and unspecified hyperlipidemia     .  Esophageal reflux     .  Diverticulosis of colon (without mention of  hemorrhage)     .  Elevated prostate specific antigen (PSA)     .  Osteoarthrosis, unspecified whether generalized or localized, unspecified site     .  Anxiety state, unspecified     .  Rectal mass           Past Surgical History   Procedure  Laterality  Date   .  Inguinal hernia repair           left         Family History   Problem  Relation  Age of Onset   .  Coronary artery disease  Father     .  Coronary artery disease  Mother     .  Heart attack  Brother     .  Colon cancer  Father         ? not exactly sure        Social History History   Substance Use Topics   .  Smoking status:  Never Smoker    .  Smokeless tobacco:  Not on file   .  Alcohol Use:  No         Current Outpatient Prescriptions   Medication  Sig  Dispense  Refill   .  aspirin 81 MG tablet  Take 81 mg by mouth daily.           Marland Kitchen  atenolol (TENORMIN) 25 MG tablet  Take 25 mg by mouth daily.         Marland Kitchen  EPINEPHrine (EPIPEN 2-PAK) 0.3 mg/0.3 mL DEVI  Inject 0.3 mLs (0.3 mg total) into the muscle once.   1 Device   1   .  esomeprazole (NEXIUM) 20 MG capsule  Take 20 mg by mouth daily at 12 noon.         .  finasteride (PROSCAR) 5 MG tablet  Take 1 tablet (5 mg total) by mouth daily.   100 tablet   3   .  fish oil-omega-3 fatty acids 1000 MG capsule  Take 2 g by mouth 2 (two) times daily.          Marland Kitchen  glucosamine-chondroitin 500-400 MG tablet  Take 1 tablet by mouth 2 (two) times daily.          Marland Kitchen  HYDROcodone-homatropine (HYCODAN) 5-1.5 MG/5ML syrup  Take 5 mLs by mouth every 8 (eight) hours as needed for cough.   240 mL   0   .  ibuprofen (ADVIL,MOTRIN) 200 MG tablet  Take 400 mg by mouth every 8 (eight) hours as needed for moderate pain.         .  multivitamin (THERAGRAN) per tablet  Take 1 tablet by mouth daily.           .  pravastatin (PRAVACHOL) 40 MG tablet  Take 1 tablet (40 mg total) by mouth every other day.   100 tablet   3   .  terazosin (HYTRIN) 10 MG capsule  Take 1 capsule (10 mg  total) by mouth at bedtime.   100 capsule   3       No current facility-administered medications for this visit.        No Known Allergies   Review of Systems  Constitutional: Positive for activity change, fatigue (Decreased energy) and unexpected weight change (Has gained 3 pounds in 3 months). Negative for fever and appetite change.  HENT: Negative.   Respiratory: Positive for cough (Nonproductive), shortness of breath and wheezing.   Cardiovascular: Positive for chest pain (Pleuritic right back and chest pain, no angina).  Gastrointestinal: Positive for abdominal distention.  Genitourinary:        BPH  Musculoskeletal:        Right shoulder pain  Psychiatric/Behavioral: The patient is nervous/anxious.       BP 130/78  Pulse 68  Resp 20  Ht 5\' 8"  (1.727 m)  Wt 167 lb (75.751 kg)  BMI 25.40 kg/m2  SpO2 96% Physical Exam  Vitals reviewed. Constitutional: He is oriented to person, place, and time. He appears well-developed and well-nourished. No distress.  HENT:   Head: Normocephalic and atraumatic.  Eyes: EOM are normal. Pupils are equal, round, and reactive to light.  Neck: Neck supple. No thyromegaly present.  Cardiovascular: Normal rate, regular rhythm and normal heart sounds.  Exam reveals no gallop and no friction rub.    No murmur heard. Pulmonary/Chest: Effort normal. He has no wheezes.  Diminished breath sounds and dullness to percussion lower half of right chest  Abdominal: Soft. He exhibits no distension.  Musculoskeletal: He exhibits no edema.  Lymphadenopathy:    He has no cervical adenopathy.  Neurological: He is alert and oriented to person, place, and time. No cranial nerve deficit.  Skin: Skin is warm and dry.        Diagnostic Tests: CT chest CT CHEST WITHOUT CONTRAST   TECHNIQUE:  Multidetector CT imaging of the chest was performed following the   standard protocol without IV contrast.   COMPARISON: Chest radiographs obtained earlier  today.   FINDINGS:   Large right pleural effusion. Compressive atelectasis of the   adjacent portions of the right lung. No visible pleural thickening   or pleural based masses. Atheromatous coronary artery   calcifications. Minimal linear atelectasis or scarring at the left   lung base. No lung nodules or enlarged lymph nodes seen.   Partially included left renal cysts. 2.1 x 1.8 cm exophytic medial   upper right renal mass measuring 38 Hounsfield units in density on   image number 65. Mild thoracic spine degenerative changes.   IMPRESSION:   1. Large right pleural effusion.   2. Compressive atelectasis of the right lung, most pronounced   involving the lower lobe.   3. Atheromatous coronary artery calcifications.   4. 2.1 cm exophytic right renal complex cyst or solid mass. If the   patient's renal function allows it, elective pre and postcontrast CT   or magnetic resonance imaging of the kidneys would be recommended.   Electronically Signed   By: Enrique Sack M.D.   On: 09/19/2013 21:54    Impression: 80 year old gentleman with a large exudative right pleural effusion. Differential diagnosis includes infection, inflammatory disease, and malignancy. The effusion is exudative and he has had a rapid return of symptoms after a thoracentesis. I think at this point the same to do is to proceed with right video-assisted thoracoscopy to completely drain the effusion and reexpand the lung. It is unclear from the CT whether a decortication will be necessary, but it is a possibility.   I explained the procedure to Mr. and Mrs. Heyward. They understand the general nature of the procedure, the need for general anesthesia, the incisions to be used, expected hospital stay, overall recovery. They understand this is both a diagnostic and therapeutic procedure. They do understand that no definitive guarantee can be given that the effusion won't recur. We discussed the indications, risks, benefits, and  alternatives. They understand the risks include, but are not limited to death, MI, DVT, PE, bleeding, possible need for transfusion, infection, prolonged air leak, irregular heart rhythms, as well as the possibility of other unforeseeable complications. He accepts the risks and wishes to proceed.   Plan: Right VATS, drainage of effusion, possible decortication Wednesday 5/27

## 2013-09-25 NOTE — Transfer of Care (Signed)
Immediate Anesthesia Transfer of Care Note  Patient: Sean Mitchell.  Procedure(s) Performed: Procedure(s): VIDEO ASSISTED THORACOSCOPY (VATS)/POSSIBLE DECORTICATION (Right) DRAINAGE OF PLEURAL EFFUSION (Right)  Patient Location: PACU  Anesthesia Type:General  Level of Consciousness: awake, alert  and patient cooperative  Airway & Oxygen Therapy: Patient Spontanous Breathing and Patient connected to face mask oxygen  Post-op Assessment: Report given to PACU RN, Post -op Vital signs reviewed and stable and Patient moving all extremities  Post vital signs: Reviewed and stable  Complications: No apparent anesthesia complications

## 2013-09-25 NOTE — Anesthesia Procedure Notes (Signed)
Procedure Name: Intubation Date/Time: 09/25/2013 4:08 PM Performed by: Izora Gala Pre-anesthesia Checklist: Patient identified, Emergency Drugs available, Suction available and Patient being monitored Patient Re-evaluated:Patient Re-evaluated prior to inductionOxygen Delivery Method: Circle system utilized Preoxygenation: Pre-oxygenation with 100% oxygen Intubation Type: IV induction Ventilation: Mask ventilation without difficulty Laryngoscope Size: Miller and 3 Grade View: Grade I Tube type: Oral Endobronchial tube: Left and 39 Fr Number of attempts: 1 Airway Equipment and Method: Stylet Placement Confirmation: ETT inserted through vocal cords under direct vision,  positive ETCO2 and breath sounds checked- equal and bilateral Secured at: 31 cm Tube secured with: Tape Dental Injury: Teeth and Oropharynx as per pre-operative assessment

## 2013-09-25 NOTE — Anesthesia Preprocedure Evaluation (Addendum)
Anesthesia Evaluation  Patient identified by MRN, date of birth, ID band Patient awake    Reviewed: Allergy & Precautions, H&P , NPO status , Patient's Chart, lab work & pertinent test results  Airway Mallampati: II TM Distance: >3 FB Neck ROM: Full    Dental  (+) Dental Advisory Given   Pulmonary shortness of breath, pneumonia -,  breath sounds clear to auscultation        Cardiovascular hypertension, Pt. on medications Rhythm:Regular Rate:Normal     Neuro/Psych PSYCHIATRIC DISORDERS Anxiety negative neurological ROS     GI/Hepatic Neg liver ROS, GERD-  ,  Endo/Other  negative endocrine ROS  Renal/GU negative Renal ROS     Musculoskeletal negative musculoskeletal ROS (+)   Abdominal   Peds  Hematology negative hematology ROS (+)   Anesthesia Other Findings   Reproductive/Obstetrics negative OB ROS                         Anesthesia Physical Anesthesia Plan  ASA: II  Anesthesia Plan: General   Post-op Pain Management:    Induction: Intravenous  Airway Management Planned: Oral ETT  Additional Equipment: Arterial line  Intra-op Plan:   Post-operative Plan: Extubation in OR  Informed Consent: I have reviewed the patients History and Physical, chart, labs and discussed the procedure including the risks, benefits and alternatives for the proposed anesthesia with the patient or authorized representative who has indicated his/her understanding and acceptance.   Dental advisory given  Plan Discussed with: CRNA  Anesthesia Plan Comments: (2x large bore IVs)       Anesthesia Quick Evaluation

## 2013-09-26 ENCOUNTER — Encounter (HOSPITAL_COMMUNITY): Payer: Self-pay | Admitting: Thoracic Surgery (Cardiothoracic Vascular Surgery)

## 2013-09-26 ENCOUNTER — Inpatient Hospital Stay (HOSPITAL_COMMUNITY): Payer: Medicare Other

## 2013-09-26 LAB — BLOOD GAS, ARTERIAL
ACID-BASE EXCESS: 0.8 mmol/L (ref 0.0–2.0)
Bicarbonate: 25 mEq/L — ABNORMAL HIGH (ref 20.0–24.0)
DRAWN BY: 36496
FIO2: 0.32 %
O2 SAT: 98.3 %
Patient temperature: 98.6
TCO2: 26.3 mmol/L (ref 0–100)
pCO2 arterial: 40.7 mmHg (ref 35.0–45.0)
pH, Arterial: 7.405 (ref 7.350–7.450)
pO2, Arterial: 105 mmHg — ABNORMAL HIGH (ref 80.0–100.0)

## 2013-09-26 LAB — CULTURE, BLOOD (ROUTINE X 2)
CULTURE: NO GROWTH
Culture: NO GROWTH

## 2013-09-26 LAB — CBC
HCT: 41.7 % (ref 39.0–52.0)
Hemoglobin: 14.2 g/dL (ref 13.0–17.0)
MCH: 31.3 pg (ref 26.0–34.0)
MCHC: 34.1 g/dL (ref 30.0–36.0)
MCV: 91.9 fL (ref 78.0–100.0)
PLATELETS: 178 10*3/uL (ref 150–400)
RBC: 4.54 MIL/uL (ref 4.22–5.81)
RDW: 14.2 % (ref 11.5–15.5)
WBC: 7 10*3/uL (ref 4.0–10.5)

## 2013-09-26 LAB — URINALYSIS, ROUTINE W REFLEX MICROSCOPIC
GLUCOSE, UA: NEGATIVE mg/dL
KETONES UR: 15 mg/dL — AB
Nitrite: NEGATIVE
Protein, ur: 100 mg/dL — AB
SPECIFIC GRAVITY, URINE: 1.044 — AB (ref 1.005–1.030)
Urobilinogen, UA: 0.2 mg/dL (ref 0.0–1.0)
pH: 5.5 (ref 5.0–8.0)

## 2013-09-26 LAB — BASIC METABOLIC PANEL
BUN: 20 mg/dL (ref 6–23)
CALCIUM: 8.3 mg/dL — AB (ref 8.4–10.5)
CO2: 24 mEq/L (ref 19–32)
CREATININE: 1.21 mg/dL (ref 0.50–1.35)
Chloride: 103 mEq/L (ref 96–112)
GFR calc Af Amer: 65 mL/min — ABNORMAL LOW (ref >90)
GFR calc non Af Amer: 56 mL/min — ABNORMAL LOW (ref >90)
GLUCOSE: 134 mg/dL — AB (ref 70–99)
POTASSIUM: 4.6 meq/L (ref 3.7–5.3)
Sodium: 138 mEq/L (ref 137–147)

## 2013-09-26 LAB — RHEUMATOID FACTORS, FLUID: Cortisol #1 (Base): NEGATIVE

## 2013-09-26 LAB — URINE MICROSCOPIC-ADD ON

## 2013-09-26 MED ORDER — SODIUM CHLORIDE 0.9 % IV SOLN
Freq: Once | INTRAVENOUS | Status: AC
  Administered 2013-09-26: 09:00:00 via INTRAVENOUS

## 2013-09-26 NOTE — Progress Notes (Signed)
Pt has had 100cc out of tea colored urine with sediment. Pt has no discomfort. Pt bladder scanned There was 34mls. Dr Caffie Pinto notified. No orders given. Will continue to monitor.

## 2013-09-26 NOTE — Op Note (Signed)
Sean Mitchell, Sean Mitchell NO.:  [id number]  MEDICAL RECORD NO.:  [id number]  LOCATION:  3S02C                        FACILITY:  [facility]  PHYSICIAN:  Revonda Standard. Roxan Hockey, M.D.DATE OF BIRTH:  1933/05/07  DATE OF PROCEDURE:  09/25/2013 DATE OF DISCHARGE:                              OPERATIVE REPORT   PREOPERATIVE DIAGNOSIS:  Right pleural effusion.  POSTOPERATIVE DIAGNOSIS:  Right pleural effusion.  PROCEDURE:  Right video-assisted thoracoscopy, drainage of right pleural effusion, pleural biopsies.  SURGEON:  Revonda Standard. Roxan Hockey, M.D.  ASSISTANT:  Suzzanne Cloud, P.A.  ANESTHESIA:  General.  FINDINGS:  2.5 L of bloody pleural fluid. Diffuse, infiltrative, confluent plaques over the entire parietal pleura, minimal fibrinous peel on the visceral pleura.  Clinically, suspicious appearance for Mesothelioma. Frozen section revealed inflammation with atypical cells but no definite tumor.  CLINICAL NOTE:  Mr. Fendley is a 80 year old gentleman with a history of exposure to asbestos and radon.  He became ill about 2-3 months prior to surgery when he noticed shortness in his right shoulder.  He developed cough and shortness of breath.  He was treated for possible viral pneumonia, but continued to have worsening shortness of breath.  He was admitted to the hospital the week prior to surgery and found to have a large right pleural effusion.  Thoracentesis was performed.  A liter of fluid was evacuated.  The effusion was exudative.  He did have some symptomatic relief for a few days.  However, he became progressively more short of breath again and had rapid reaccumulation of the effusion. He was advised to undergo thoracoscopic drainage and pleural biopsies and possible decortication.  The indications, risks, benefits, and alternatives were discussed in detail with the patient.  He understood and accepted the risks and agreed to proceed.  OPERATIVE NOTE:  Mr. Cambre was  brought to the preoperative holding area on Sep 25, 2013.  There, Anesthesia placed an arterial blood pressure monitoring line and established intravenous access.  He was taken to the operating room, anesthetized, and intubated with a double-lumen endotracheal tube.  Intravenous antibiotics were administered.  A Foley catheter was placed.  Sequential compression devices were placed on the legs for DVT prophylaxis.  He was placed in the left lateral decubitus position and the right chest was prepped and draped in the usual sterile fashion.  An incision was made in the seventh intercostal space in the midaxillary line.  It was carried through the skin and subcutaneous tissue.  The chest was entered bluntly using a hemostat.  A sucker was placed into the chest and fluid was removed and sent for cell count with differential, as well as serologic markers.  The remainder of the fluid was evacuated.  There was approximately 2.5 L of fluid and remainder of the fluid was sent for cytology.  A port was inserted and the thoracoscope was placed in the chest.  There was some residual fluid still present.  There were some thin fibrinous strands between the visceral and parietal pleura, but no significant loculations.  There was a fibrinous exudate on the visceral pleura as well.  The parietal pleura was markedly abnormal with white patchy appearance of  multiple infiltrative confluent pleural plaques.  This essentially involved the entire parietal pleural surface.  There was a more masslike appearance near the diaphragm.  A small working incision was made in the fifth intercostal space anterolaterally.  No rib spreading was performed.  The remainder of the fluid was evacuated.  A portion of the parietal pleura which appeared suspicious for malignancy was sent for frozen section. While awaiting that results, the fibrinous peel was removed from the lung.  The only portion of the lung that did not fully  expand with reinflation was at the base of the right lower lobe.  This was not clearly encased in a peel.  It was unclear exactly why this would not fully re-expand but overall there was good re-expansion of the remainder of the lung.  The frozen section returned showing marked inflammation and fibrosis.  There were atypical cells highly suspicious, but not definitively diagnostic, for malignancy.  Additional pleural biopsies were taken from multiple other areas and sent for permanent pathology.  A 32-French Blake drain was placed through the original port incision.  The lung was reinflated.  There was good re-expansion as noted.  The utility incision was closed with a running #1 Vicryl fascial suture followed by standard subcutaneous tissue and skin closures.  All sponge, needle, and instrument counts were correct at the end of the procedure.  The patient was taken from the operating room to the postanesthetic care unit, extubated and in good condition.     Revonda Standard Roxan Hockey, M.D.     SCH/MEDQ  D:  09/25/2013  T:  09/26/2013  Job:  [id number]

## 2013-09-26 NOTE — Progress Notes (Signed)
[facility] DAILY ICU PROGRESS NOTE                   [address]            [place], [address]          [phone]   1 Day Post-Op Procedure(s) (LRB): VIDEO ASSISTED THORACOSCOPY (VATS)/POSSIBLE DECORTICATION (Right) DRAINAGE OF PLEURAL EFFUSION (Right)  Total Length of Stay:  LOS: 1 day   Subjective: Some pain, poor UO overnight  Objective: Vital signs in last 24 hours: Temp:  [97 F (36.1 C)-98.3 F (36.8 C)] 98.3 F (36.8 C) (05/28 0015) Pulse Rate:  [44-72] 67 (05/28 0600) Cardiac Rhythm:  [-] Normal sinus rhythm (05/28 0000) Resp:  [11-20] 16 (05/28 0600) BP: (114-140)/(62-89) 115/68 mmHg (05/28 0600) SpO2:  [96 %-100 %] 97 % (05/28 0600) Arterial Line BP: (109-159)/(50-67) 119/53 mmHg (05/28 0400) Weight:  [167 lb (75.751 kg)] 167 lb (75.751 kg) (05/27 1930)  Filed Weights   09/25/13 1930  Weight: 167 lb (75.751 kg)    Weight change:    Hemodynamic parameters for last 24 hours:    Intake/Output from previous day: 05/27 0701 - 05/28 0700 In: 2925 [P.O.:240; I.V.:2635; IV Piggyback:50] Out: 9417 [Urine:280; Chest Tube:440]  Intake/Output this shift:    Current Meds: Scheduled Meds: . acetaminophen  1,000 mg Oral 4 times per day   Or  . acetaminophen (TYLENOL) oral liquid 160 mg/5 mL  1,000 mg Oral 4 times per day  . aspirin  81 mg Oral Daily  . atenolol  25 mg Oral Daily  . bisacodyl  10 mg Oral Daily  . cefUROXime (ZINACEF)  IV  1.5 g Intravenous Q12H  . fentaNYL   Intravenous 6 times per day  . finasteride  5 mg Oral Daily  . ketorolac  15 mg Intravenous 4 times per day  . pantoprazole  40 mg Oral Daily  . senna-docusate  1 tablet Oral QHS  . simvastatin  20 mg Oral QODAY  . terazosin  10 mg Oral QHS   Continuous Infusions: . dextrose 5 % and 0.45 % NaCl with KCl 20 mEq/L 100 mL/hr at 09/25/13 2139   PRN Meds:.diphenhydrAMINE, diphenhydrAMINE, naloxone, ondansetron (ZOFRAN) IV, oxyCODONE, potassium chloride, sodium chloride,  traMADol  General appearance: alert, cooperative and no distress Heart: regular rate and rhythm Lungs: clear to auscultation bilaterally Abdomen: soft non-tender Extremities: no edema Wound: dressings intact with minor drainage  Lab Results: CBC: Recent Labs  09/26/13 0500  WBC 7.0  HGB 14.2  HCT 41.7  PLT 178   BMET:  Recent Labs  09/26/13 0500  NA 138  K 4.6  CL 103  CO2 24  GLUCOSE 134*  BUN 20  CREATININE 1.21  CALCIUM 8.3*    PT/INR:  Recent Labs  09/25/13 0950  LABPROT 12.8  INR 0.98   Radiology: Dg Chest 2 View  09/24/2013   CLINICAL DATA:  History of right effusion.  EXAM: CHEST  2 VIEW  COMPARISON:  09/20/2013  FINDINGS: Cardiac shadow remains enlarged. The left lung is clear. Persistent effusion is noted on the right but mildly improved from the prior exam. Underlying atelectasis/ infiltrate is likely present as well.  IMPRESSION: Persistent changes in the right lung base consisting of effusion and atelectasis/infiltrate.   Electronically Signed   By: Inez Catalina M.D.   On: 09/24/2013 11:04   Dg Chest Port 1 View  09/25/2013   CLINICAL DATA:  Post VATS.  EXAM: PORTABLE  CHEST - 1 VIEW  COMPARISON:  09/24/2013 and 09/20/2013.  FINDINGS: 1824 hr. Right chest tube in is in place. There is soft tissue emphysema laterally in the right chest wall. No significant pneumothorax is seen. There is a small residual right pleural effusion with right basilar pulmonary opacity. There is also mild left basilar atelectasis. The heart size is unchanged.  IMPRESSION: Decreased size of right pleural effusion following VATS. No pneumothorax.   Electronically Signed   By: Camie Patience M.D.   On: 09/25/2013 19:36   Chest tube - small air leak with cough, 440 cc drained    Assessment/Plan: S/P Procedure(s) (LRB): VIDEO ASSISTED THORACOSCOPY (VATS)/POSSIBLE DECORTICATION (Right) DRAINAGE OF PLEURAL EFFUSION (Right)  1 poor UO overnight , may be dry- cont IVF and give a fluid  bolus. He has history of prostate enlargement with targeted radiation tx in past. + hematuria on UA 5/22. Will repeat, but may need urology follow-up. W 2 moderate drainage from chest tube- keep for now till path back as may need talc 3 will stop torodol as pain is tolerable with pca and dont want any potential kidney issues   Sean Mitchell 09/26/2013 7:58 AM

## 2013-09-26 NOTE — Progress Notes (Signed)
Utilization review completed. Deandre Stansel, RN, BSN. 

## 2013-09-27 ENCOUNTER — Inpatient Hospital Stay (HOSPITAL_COMMUNITY): Payer: Medicare Other

## 2013-09-27 ENCOUNTER — Telehealth: Payer: Self-pay | Admitting: Family Medicine

## 2013-09-27 LAB — URINE CULTURE
CULTURE: NO GROWTH
Colony Count: NO GROWTH

## 2013-09-27 LAB — COMPREHENSIVE METABOLIC PANEL
ALK PHOS: 61 U/L (ref 39–117)
ALT: 14 U/L (ref 0–53)
AST: 17 U/L (ref 0–37)
Albumin: 2.2 g/dL — ABNORMAL LOW (ref 3.5–5.2)
BUN: 14 mg/dL (ref 6–23)
CO2: 23 mEq/L (ref 19–32)
Calcium: 8.3 mg/dL — ABNORMAL LOW (ref 8.4–10.5)
Chloride: 102 mEq/L (ref 96–112)
Creatinine, Ser: 1.11 mg/dL (ref 0.50–1.35)
GFR calc Af Amer: 72 mL/min — ABNORMAL LOW (ref >90)
GFR calc non Af Amer: 62 mL/min — ABNORMAL LOW (ref >90)
Glucose, Bld: 123 mg/dL — ABNORMAL HIGH (ref 70–99)
POTASSIUM: 4.6 meq/L (ref 3.7–5.3)
SODIUM: 135 meq/L — AB (ref 137–147)
TOTAL PROTEIN: 5.3 g/dL — AB (ref 6.0–8.3)
Total Bilirubin: 0.3 mg/dL (ref 0.3–1.2)

## 2013-09-27 LAB — CBC
HCT: 40.6 % (ref 39.0–52.0)
Hemoglobin: 13.7 g/dL (ref 13.0–17.0)
MCH: 31.4 pg (ref 26.0–34.0)
MCHC: 33.7 g/dL (ref 30.0–36.0)
MCV: 92.9 fL (ref 78.0–100.0)
Platelets: 178 10*3/uL (ref 150–400)
RBC: 4.37 MIL/uL (ref 4.22–5.81)
RDW: 14.2 % (ref 11.5–15.5)
WBC: 7.4 10*3/uL (ref 4.0–10.5)

## 2013-09-27 MED ORDER — BENZONATATE 100 MG PO CAPS
200.0000 mg | ORAL_CAPSULE | Freq: Three times a day (TID) | ORAL | Status: DC | PRN
  Filled 2013-09-27: qty 2

## 2013-09-27 MED ORDER — MUPIROCIN 2 % EX OINT
TOPICAL_OINTMENT | Freq: Two times a day (BID) | CUTANEOUS | Status: DC
  Administered 2013-09-27 – 2013-09-30 (×6): via NASAL
  Filled 2013-09-27: qty 22

## 2013-09-27 NOTE — Care Management Note (Signed)
    Page 1 of 1   09/30/2013     12:45:34 PM CARE MANAGEMENT NOTE 09/30/2013  Patient:  Sean Mitchell, Sean Mitchell   Account Number:  [id number]  Date Initiated:  09/27/2013  Documentation initiated by:  GRAVES-BIGELOW,BRENDA  Subjective/Objective Assessment:   Pt admitted for Pleural Effusion. S/p VATS-09-25-13. Per MD notes CT no air leak, CXR stable. Continue CT for now - may be able to decrease to water seal.  Decrease IVF, continue PCA.     Action/Plan:   CM will continue to monitor for disposition needs.   Anticipated DC Date:  09/30/2013   Anticipated DC Plan:  [facility]  CM consult      Choice offered to / List presented to:             Status of service:  In process, will continue to follow Medicare Important Message given?  YES (If response is "NO", the following Medicare IM given date fields will be blank) Date Medicare IM given:  09/25/2013 Date Additional Medicare IM given:  09/30/2013  Discharge Disposition:  HOME/SELF CARE  Per UR Regulation:  Reviewed for med. necessity/level of care/duration of stay  If discussed at [place] of Stay Meetings, dates discussed:    Comments:  09/30/13- 1237- Marvetta Gibbons RN, BSN [phone] Pt to have last CT pulled today- and if post xray looks ok- home later this afternoon- additional IM given to pt- and signed copy placed in shadow chart- no d/c needs identified-

## 2013-09-27 NOTE — Telephone Encounter (Signed)
Pt's wife called to let you know pt is in the stepdown unit at [facility].  They suspect it is cancer. Pt cancelled cpe for June.

## 2013-09-27 NOTE — Progress Notes (Addendum)
       [address]       [place] [address]             [phone]          2 Days Post-Op Procedure(s) (LRB): VIDEO ASSISTED THORACOSCOPY (VATS)/POSSIBLE DECORTICATION (Right) DRAINAGE OF PLEURAL EFFUSION (Right)  Subjective: OOB in chair, comfortable, just got Foley out.   Objective: Vital signs in last 24 hours: Patient Vitals for the past 24 hrs:  BP Temp Temp src Pulse Resp SpO2  09/27/13 0753 114/70 mmHg 97.7 F (36.5 C) Oral 68 23 93 %  09/27/13 0406 106/60 mmHg 98.2 F (36.8 C) Oral 68 12 93 %  09/27/13 0400 - - - - 94 95 %  09/26/13 2358 - - - - 19 93 %  09/26/13 2355 115/62 mmHg - - - - -  09/26/13 2350 115/62 mmHg 97.9 F (36.6 C) Oral 78 20 94 %  09/26/13 2000 - - - - 20 93 %  09/26/13 1947 106/58 mmHg 99 F (37.2 C) Oral 65 19 94 %  09/26/13 1500 - 98.5 F (36.9 C) Oral - - -  09/26/13 1200 - 98.2 F (36.8 C) Oral - - -  09/26/13 1127 101/62 mmHg - - 59 14 94 %  09/26/13 0805 94/61 mmHg - - 75 14 94 %   Current Weight  09/25/13 167 lb (75.751 kg)     Intake/Output from previous day: 05/28 0701 - 05/29 0700 In: 2330 [P.O.:720; I.V.:1110] Out: 2065 [Urine:1715; Chest Tube:350]    PHYSICAL EXAM:  Heart: RRR Lungs: Slightly decreased BS in bases Wound: Dressed and dry Chest tube: No air leak    Lab Results: CBC: Recent Labs  09/26/13 0500 09/27/13 0418  WBC 7.0 7.4  HGB 14.2 13.7  HCT 41.7 40.6  PLT 178 178   BMET:  Recent Labs  09/26/13 0500 09/27/13 0418  NA 138 135*  K 4.6 4.6  CL 103 102  CO2 24 23  GLUCOSE 134* 123*  BUN 20 14  CREATININE 1.21 1.11  CALCIUM 8.3* 8.3*    PT/INR:  Recent Labs  09/25/13 0950  LABPROT 12.8  INR 0.98   CXR: FINDINGS:  Cardiomediastinal silhouette is stable. Right chest tube is  unchanged in position. Subcutaneous emphysema right chest wall is  stable. No pneumothorax. Mild right basilar atelectasis. Left lung  is clear.  IMPRESSION:  Stable right chest  tube position. No pneumothorax. Left lung is  clear. Mild right basilar atelectasis.    Assessment/Plan: S/P Procedure(s) (LRB): VIDEO ASSISTED THORACOSCOPY (VATS)/POSSIBLE DECORTICATION (Right) DRAINAGE OF PLEURAL EFFUSION (Right) CT no air leak, CXR stable.  Output 350/24 hrs, 150 overnight.  Continue CT for now - may be able to decrease to water seal. Decrease IVF, continue PCA. Ambulate, pulm toilet. Path pending.   LOS: 2 days    Coolidge Breeze 09/27/2013  Patient seen and examined, agree with above Path still pending

## 2013-09-27 NOTE — Discharge Summary (Signed)
[address]       [place], [address]             [phone]              Discharge Summary  Name: Sean Mitchell. DOB: 06-Dec-1933 80 y.o. MRN: [id number]   Admission Date: 09/25/2013 Discharge Date:     Admitting Diagnosis: Right pleural effusion   Discharge Diagnosis:  Right pleural effusion.(Final pathology pending from biopsies)   Past Medical History  Diagnosis Date  . Personal history of contact with and (suspected) exposure to asbestos   . Unspecified essential hypertension   . Other and unspecified hyperlipidemia   . Esophageal reflux   . Diverticulosis of colon (without mention of hemorrhage)   . Elevated prostate specific antigen (PSA)   . Osteoarthrosis, unspecified whether generalized or localized, unspecified site   . Anxiety state, unspecified   . Rectal mass   . Shortness of breath     due to fluid     Procedures: RIGHT VIDEO ASSISTED THORACOSCOPY -  09/25/2013  DRAINAGE OF PLEURAL EFFUSION  PLEURAL BIOPSIES    HPI:  The patient is a 80 y.o. male lifelong nonsmoker. 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  He was in his usual state of health until about 2-3 months ago. The first thing he noticed was soreness in his right shoulder. This was not related to any trauma. This lasted for about 2-3 weeks. He developed a cough and some shortness of breath. He saw Dr. Sherren Mocha, who treated him for possible viral pneumonia with doxycycline, steroids, and cough suppressants. Despite treatment, he had progression of his shortness of breath and was admitted to the hospital one week ago. He was found to have a large right pleural effusion. He underwent thoracentesis,and 1.2 L of exudative fluid was evacuated. Cultures have been no growth to this point. He did have a symptomatic relief after thoracentesis for 2 days. Then on 5/26,  he noticed worsening shortness of breath again. His wife called the office the next morning asking that he be seen. He denies weight loss, fever, chills, diaphoresis. He does have a right sided pleuritic chest and back pain. He does not had any anginal-type pain. He has no history of heart disease. He's had a nonproductive cough. He has not had any weight loss. He was very active up until the time he became ill.  Dr. Roxan Hockey saw the patient and reviewed his films.  It was felt that he would benefit from a right VATS for drainage of the effusion and possible decortication. All risks, benefits and alternatives of surgery were explained in detail, and the patient agreed to proceed.    Hospital Course:  The patient was admitted to [facility] on 09/25/2013. The patient was taken to the operating room and underwent the above procedure.  Intraoperatively, 2.5 liters of bloody fluid were drained.  The parietal pleura was noted to have diffuse infiltrative plaques suspicious for mesothelioma, and these areas were biopsied and sent for pathology.  Frozen section revealed an inflammatory process with atypical cells.  The postoperative course has generally been uneventful.  Pathology is currently pending. All routine lines, monitors and drainage devices have been discontinued in the standard fashion. Laboratory values are stable. He is hemodynamically stable and oxygen has been weaned. He  is tolerating activity using standard protocols. Incisions are noted to be healing well without evidence of infection. He is tolerating diet. His overall status is felt to be stable for discharge on today's date.    Recent vital signs:  Filed Vitals:   09/30/13 1230  BP:   Pulse:   Temp: 98.9 F (37.2 C)  Resp:     Recent laboratory studies:  CBC:No results found for this basename: WBC, HGB, HCT, PLT,  in the last 72 hours BMET: No results found for this basename: NA, K, CL, CO2, GLUCOSE, BUN, CREATININE, CALCIUM,  in  the last 72 hours  PT/INR: No results found for this basename: LABPROT, INR,  in the last 72 hours   Discharge Medications:     Medication List    STOP taking these medications       ibuprofen 200 MG tablet  Commonly known as:  ADVIL,MOTRIN      TAKE these medications       aspirin 81 MG tablet  Take 81 mg by mouth daily.     atenolol 25 MG tablet  Commonly known as:  TENORMIN  Take 25 mg by mouth daily.     EPINEPHrine 0.3 mg/0.3 mL Soaj injection  Commonly known as:  EPIPEN 2-PAK  Inject 0.3 mLs (0.3 mg total) into the muscle once.     esomeprazole 20 MG capsule  Commonly known as:  NEXIUM  Take 20 mg by mouth daily at 12 noon.     finasteride 5 MG tablet  Commonly known as:  PROSCAR  Take 1 tablet (5 mg total) by mouth daily.     fish oil-omega-3 fatty acids 1000 MG capsule  Take 2 g by mouth 2 (two) times daily.     glucosamine-chondroitin 500-400 MG tablet  Take 1 tablet by mouth 2 (two) times daily.     HYDROcodone-homatropine 5-1.5 MG/5ML syrup  Commonly known as:  HYCODAN  Take 5 mLs by mouth every 8 (eight) hours as needed for cough.     multivitamin per tablet  Take 1 tablet by mouth daily.     oxyCODONE 5 MG immediate release tablet  Commonly known as:  Oxy IR/ROXICODONE  Take 1-2 tablets (5-10 mg total) by mouth every 6 (six) hours as needed for severe pain.     pravastatin 40 MG tablet  Commonly known as:  PRAVACHOL  Take 1 tablet (40 mg total) by mouth every other day.     terazosin 10 MG capsule  Commonly known as:  HYTRIN  Take 1 capsule (10 mg total) by mouth at bedtime.         Discharge Instructions:  The patient is to refrain from driving, heavy lifting or strenuous activity.  May shower daily and clean incisions with soap and water.  May resume regular diet.   Follow Up:  Follow-up Information   Follow up with Melrose Nakayama, MD. (10/15/2013 at 10:45 AM. Please obtain a chest x-ray at [facility] at 9:45 AM.   [facility] imaging is located in the same office complex.)    Specialty:  Cardiothoracic Surgery   Contact information:   [place] [address] [phone]       Follow up with [facility]. (10/04/2013 at 10 AM to see the nurse at Dr. Leonarda Salon office for suture removal.)        John Giovanni 09/30/2013, 1:50 PM

## 2013-09-28 ENCOUNTER — Inpatient Hospital Stay (HOSPITAL_COMMUNITY): Payer: Medicare Other

## 2013-09-28 LAB — ANA, BODY FLUID: ANTI-NUCLEAR AB, IGG: NOT DETECTED

## 2013-09-28 NOTE — Progress Notes (Addendum)
      [address]       [place] [address]             [phone]       3 Days Post-Op Procedure(s) (LRB): VIDEO ASSISTED THORACOSCOPY (VATS)/POSSIBLE DECORTICATION (Right) DRAINAGE OF PLEURAL EFFUSION (Right)  Subjective: Patient with bowel movement. Anxiously awaiting pathology results.  Objective: Vital signs in last 24 hours: Temp:  [97.4 F (36.3 C)-98.2 F (36.8 C)] 98.1 F (36.7 C) (05/30 0753) Pulse Rate:  [66-81] 68 (05/30 0320) Cardiac Rhythm:  [-] Normal sinus rhythm (05/30 0800) Resp:  [12-22] 15 (05/30 0400) BP: (97-128)/(59-72) 105/59 mmHg (05/30 0753) SpO2:  [94 %-97 %] 95 % (05/30 0753)      Intake/Output from previous day: 05/29 0701 - 05/30 0700 In: 640 [P.O.:360; I.V.:280] Out: 2655 [Urine:2455; Chest Tube:200]   Physical Exam:  Cardiovascular: RRR. Pulmonary: Clear to auscultation on left and slightly diminished on right base; no rales, wheezes, or rhonchi. Abdomen: Soft, non tender, bowel sounds present. Wounds: Clean and dry.  No erythema or signs of infection. Chest Tube: to water seal, tidling with cough  Lab Results: CBC: Recent Labs  09/26/13 0500 09/27/13 0418  WBC 7.0 7.4  HGB 14.2 13.7  HCT 41.7 40.6  PLT 178 178   BMET:  Recent Labs  09/26/13 0500 09/27/13 0418  NA 138 135*  K 4.6 4.6  CL 103 102  CO2 24 23  GLUCOSE 134* 123*  BUN 20 14  CREATININE 1.21 1.11  CALCIUM 8.3* 8.3*    PT/INR: No results found for this basename: LABPROT, INR,  in the last 72 hours ABG:  INR: Will add last result for INR, ABG once components are confirmed Will add last 4 CBG results once components are confirmed  Assessment/Plan:  1. CV - SR. On Atenolol 25 daily as taken pre op. 2.  Pulmonary - Chest tube with 250 cc since 7 am yesterday. Chest tube is to water seal and there is tidling with cough. CXR shows patient is rotated to the left, bibasilar atelectasis. Pathology results still pending. Encourage  incentive spirometer and flutter valve   Donielle M ZimmermanPA-C 09/28/2013,9:57 AM  Patient seen and examined, agree with above Drainage decreasing- keep tube today

## 2013-09-28 NOTE — Progress Notes (Signed)
Wasted 35 ml Fentanyl from PCA syringe w/ Cherly Beach, RN.

## 2013-09-29 ENCOUNTER — Inpatient Hospital Stay (HOSPITAL_COMMUNITY): Payer: Medicare Other

## 2013-09-29 LAB — TISSUE CULTURE
Culture: NO GROWTH
Gram Stain: NONE SEEN

## 2013-09-29 NOTE — Progress Notes (Addendum)
      [address]       [place] [address]             [phone]       4 Days Post-Op Procedure(s) (LRB): VIDEO ASSISTED THORACOSCOPY (VATS)/POSSIBLE DECORTICATION (Right) DRAINAGE OF PLEURAL EFFUSION (Right)  Subjective: Patient anxiously awaiting pathology results.  Objective: Vital signs in last 24 hours: Temp:  [97.8 F (36.6 C)-98.6 F (37 C)] 98.1 F (36.7 C) (05/31 0812) Pulse Rate:  [69] 69 (05/30 2045) Cardiac Rhythm:  [-] Normal sinus rhythm (05/31 0320) Resp:  [14-16] 14 (05/31 0812) BP: (99-123)/(56-72) 120/68 mmHg (05/31 0812) SpO2:  [96 %-97 %] 96 % (05/31 0320)      Intake/Output from previous day: 05/30 0701 - 05/31 0700 In: 1218.8 [P.O.:1080; I.V.:138.8] Out: 1485 [Urine:1375; Chest Tube:110]   Physical Exam:  Cardiovascular: RRR. Pulmonary: Clear to auscultation on left and coarseon right base Abdomen: Soft, non tender, bowel sounds present. Wounds: Clean and dry.  No erythema or signs of infection. Chest Tube: to water seal, no air leak  Lab Results: CBC:  Recent Labs  09/27/13 0418  WBC 7.4  HGB 13.7  HCT 40.6  PLT 178   BMET:   Recent Labs  09/27/13 0418  NA 135*  K 4.6  CL 102  CO2 23  GLUCOSE 123*  BUN 14  CREATININE 1.11  CALCIUM 8.3*    PT/INR: No results found for this basename: LABPROT, INR,  in the last 72 hours ABG:  INR: Will add last result for INR, ABG once components are confirmed Will add last 4 CBG results once components are confirmed  Assessment/Plan:  1. CV - SR. On Atenolol 25 daily as taken pre op. 2.  Pulmonary - Chest tube with decreasing output-110 cc since 7 am yesterday. Chest tube is to water seal and there is no air leak. CXR appears to show  bibasilar atelectasis and no ptx. Pathology results still pending. Likely remove chest tube in am.Encourage incentive spirometer and flutter valve   Amiree No M ZimmermanPA-C 09/29/2013,8:18 AM

## 2013-09-30 ENCOUNTER — Inpatient Hospital Stay (HOSPITAL_COMMUNITY): Payer: Medicare Other

## 2013-09-30 MED ORDER — OXYCODONE HCL 5 MG PO TABS: 5.0000 mg | ORAL_TABLET | Freq: Four times a day (QID) | ORAL | Status: DC | PRN

## 2013-09-30 NOTE — Progress Notes (Signed)
Additional copy  Medicare IM given to pt and signed copy placed in shadow chart- signed 09/30/13 1237

## 2013-09-30 NOTE — Progress Notes (Addendum)
       [address]       [place] [address]             [phone]          5 Days Post-Op Procedure(s) (LRB): VIDEO ASSISTED THORACOSCOPY (VATS)/POSSIBLE DECORTICATION (Right) DRAINAGE OF PLEURAL EFFUSION (Right)  Subjective: Feels ok. Frustrated that path results are still pending. His brother passed away this weekend, and he is also upset that he is still in the hospital and won't be able to be with his family.   Objective: Vital signs in last 24 hours: Patient Vitals for the past 24 hrs:  BP Temp Temp src Pulse Resp SpO2  09/30/13 0700 - 98.1 F (36.7 C) Oral - - -  09/30/13 0415 113/66 mmHg 98.2 F (36.8 C) Oral - 10 96 %  09/30/13 0001 107/68 mmHg 97.6 F (36.4 C) Oral 74 12 98 %  09/29/13 2024 121/65 mmHg 98.4 F (36.9 C) Oral 66 15 98 %  09/29/13 1705 129/72 mmHg 97.6 F (36.4 C) Oral - 16 -  09/29/13 1117 117/65 mmHg 97.8 F (36.6 C) Oral 62 16 -   Current Weight  09/25/13 167 lb (75.751 kg)     Intake/Output from previous day: 05/31 0701 - 06/01 0700 In: 120 [P.O.:120] Out: 850 [Urine:700; Chest Tube:150]    PHYSICAL EXAM:  Heart: RRR Lungs: Diminished BS in R base Wound: Clean and dry Chest tube: No air leak    CXR: FINDINGS:  There is a chest tube on the right, unchanged in position. There is  subcutaneous emphysema on the right, stable. No pneumothorax is  appreciable on this study. There is bibasilar atelectatic change,  more on the right than on the left. There is no appreciable effusion  or consolidation. The heart size and pulmonary vascularity are  normal.  IMPRESSION:  Chest tube position on the right appears stable. There is  subcutaneous emphysema on the right but no demonstrable  pneumothorax. There is bibasilar atelectatic change, more on the  right than on the left. No appreciable effusion or consolidation.   Pleural Fluid cytology= mixed inflammatory cells    Assessment/Plan: S/P Procedure(s)  (LRB): VIDEO ASSISTED THORACOSCOPY (VATS)/POSSIBLE DECORTICATION (Right) DRAINAGE OF PLEURAL EFFUSION (Right) CT with no air leak, CXR stable.  Still with 150 ml/24 hrs.  Continue CT to water seal for now. Path pending.    LOS: 5 days    Coolidge Breeze 09/30/2013  Path still pending is being sent out to [facility]. Home this evening

## 2013-09-30 NOTE — Progress Notes (Signed)
Utilization review completed.  

## 2013-09-30 NOTE — Discharge Instructions (Signed)
Thoracoscopy Care After Refer to this sheet in the next few weeks. These discharge instructions provide you with general information on caring for yourself after you leave the hospital. Your caregiver may also give you specific instructions. Your treatment has been planned according to the most current medical practices available, but unavoidable complications sometimes occur. If you have any problems or questions after discharge, call your caregiver. HOME CARE INSTRUCTIONS   Remove the bandage (dressing) over your chest tube site as directed by your caregiver.  It is normal to be sore for a couple weeks following surgery. See your caregiver if this seems to be getting worse rather than better.  Only take over-the-counter or prescription medicines for pain, discomfort, or fever as directed by your caregiver. It is very important to take pain medicine when you need it so that you will cough and breathe deeply enough to clear mucus (phlegm) and expand your lungs.  If it hurts to cough, hold a pillow against your chest when you cough. This may help with the discomfort. In spite of the discomfort, cough frequently, as this helps protect against getting an infection in your lung (pneumonia).  Taking deep breaths keeps lungs inflated and protects against pneumonia. Most patients will go home with an incentive spirometer that encourages deep breathing.  You may resume a normal diet and activities as directed.  Use showers for bathing until you see your caregiver, or as instructed.  Change dressings if necessary or as directed.  Avoid lifting or driving until you are instructed otherwise.  Make an appointment to see your caregiver for stitch (suture) or staple removal when instructed.  Do not travel by airplane for 2 weeks after the chest tube is removed. SEEK MEDICAL CARE IF:   You are bleeding from your wounds.  You have redness, swelling, or increasing pain in the wounds.  Your heartbeat  feels irregular or very fast.  There is pus coming from your wounds.  There is a bad smell coming from the wound or dressing. SEEK IMMEDIATE MEDICAL CARE IF:   You have a fever.  You develop a rash.  You have difficulty breathing.  You develop any reaction or side effects to medicines given.  You develop lightheadedness or feel faint.  You develop shortness of breath or chest pain. MAKE SURE YOU:   Understand these instructions.  Will watch your condition.  Will get help right away if you are not doing well or get worse. Document Released: 11/05/2004 Document Revised: 07/11/2011 Document Reviewed: 10/06/2010 [facility] Patient Information 2014 [place], [place].

## 2013-09-30 NOTE — Progress Notes (Signed)
Discharge instructions given to patient and wife with teach-back.  All questions answered.  No complaints.

## 2013-09-30 NOTE — Telephone Encounter (Signed)
noted 

## 2013-10-02 LAB — RHEUMATOID FACTORS, FLUID: Cortisol #1 (Base): NEGATIVE

## 2013-10-03 ENCOUNTER — Encounter (HOSPITAL_COMMUNITY): Payer: Self-pay

## 2013-10-04 ENCOUNTER — Telehealth: Payer: Self-pay | Admitting: Thoracic Surgery (Cardiothoracic Vascular Surgery)

## 2013-10-04 ENCOUNTER — Ambulatory Visit (INDEPENDENT_AMBULATORY_CARE_PROVIDER_SITE_OTHER): Payer: Self-pay | Admitting: *Deleted

## 2013-10-04 DIAGNOSIS — Z09 Encounter for follow-up examination after completed treatment for conditions other than malignant neoplasm: Secondary | ICD-10-CM

## 2013-10-04 DIAGNOSIS — J90 Pleural effusion, not elsewhere classified: Secondary | ICD-10-CM

## 2013-10-04 DIAGNOSIS — Z4802 Encounter for removal of sutures: Secondary | ICD-10-CM

## 2013-10-04 DIAGNOSIS — D381 Neoplasm of uncertain behavior of trachea, bronchus and lung: Secondary | ICD-10-CM

## 2013-10-04 NOTE — Telephone Encounter (Signed)
I called Mr. And Mrs. Gwinner to inform them of the pathology.  The pathology showed malignant mesothelioma.   My office will arrange a PET CT and an appointment to see Dr. Julien Nordmann in [facility] next week

## 2013-10-04 NOTE — Progress Notes (Signed)
Sean Mitchell returns for suture removal from one previous right chest tube site.  This was easily done and the site looks good. The mini thoracotomy incision is healing well.  There is no sign of infection at either site.  There are good lung sounds but slightly diminished at the right lower base. He is experiencing only minimal soreness/pain.  He will return as scheduled with a chest xray.

## 2013-10-07 ENCOUNTER — Other Ambulatory Visit: Payer: Medicare Other

## 2013-10-07 ENCOUNTER — Other Ambulatory Visit: Payer: Self-pay

## 2013-10-07 DIAGNOSIS — C459 Mesothelioma, unspecified: Secondary | ICD-10-CM

## 2013-10-08 ENCOUNTER — Ambulatory Visit (HOSPITAL_COMMUNITY)
Admission: RE | Admit: 2013-10-08 | Discharge: 2013-10-08 | Disposition: A | Discharge status: Home / Self Care | Payer: Medicare Other | Source: Ambulatory Visit | Attending: Thoracic Surgery (Cardiothoracic Vascular Surgery) | Admitting: Thoracic Surgery (Cardiothoracic Vascular Surgery)

## 2013-10-08 ENCOUNTER — Encounter (HOSPITAL_COMMUNITY): Payer: Self-pay

## 2013-10-08 DIAGNOSIS — I2510 Atherosclerotic heart disease of native coronary artery without angina pectoris: Secondary | ICD-10-CM | POA: Diagnosis not present

## 2013-10-08 DIAGNOSIS — N289 Disorder of kidney and ureter, unspecified: Secondary | ICD-10-CM | POA: Diagnosis not present

## 2013-10-08 DIAGNOSIS — C384 Malignant neoplasm of pleura: Secondary | ICD-10-CM | POA: Diagnosis present

## 2013-10-08 DIAGNOSIS — K4090 Unilateral inguinal hernia, without obstruction or gangrene, not specified as recurrent: Secondary | ICD-10-CM | POA: Diagnosis not present

## 2013-10-08 DIAGNOSIS — J9819 Other pulmonary collapse: Secondary | ICD-10-CM | POA: Insufficient documentation

## 2013-10-08 DIAGNOSIS — J90 Pleural effusion, not elsewhere classified: Secondary | ICD-10-CM | POA: Insufficient documentation

## 2013-10-08 DIAGNOSIS — I517 Cardiomegaly: Secondary | ICD-10-CM | POA: Diagnosis not present

## 2013-10-08 DIAGNOSIS — I6529 Occlusion and stenosis of unspecified carotid artery: Secondary | ICD-10-CM | POA: Diagnosis not present

## 2013-10-08 DIAGNOSIS — C459 Mesothelioma, unspecified: Secondary | ICD-10-CM

## 2013-10-08 LAB — GLUCOSE, CAPILLARY: GLUCOSE-CAPILLARY: 95 mg/dL (ref 70–99)

## 2013-10-08 MED ORDER — FLUDEOXYGLUCOSE F - 18 (FDG) INJECTION
5.6000 | Freq: Once | INTRAVENOUS | Status: AC | PRN
  Administered 2013-10-08: 5.6 via INTRAVENOUS

## 2013-10-09 ENCOUNTER — Telehealth: Payer: Self-pay | Admitting: Internal Medicine

## 2013-10-09 ENCOUNTER — Ambulatory Visit (HOSPITAL_BASED_OUTPATIENT_CLINIC_OR_DEPARTMENT_OTHER): Payer: Medicare Other

## 2013-10-09 ENCOUNTER — Ambulatory Visit (HOSPITAL_BASED_OUTPATIENT_CLINIC_OR_DEPARTMENT_OTHER): Payer: Medicare Other | Admitting: Internal Medicine

## 2013-10-09 ENCOUNTER — Encounter: Payer: Self-pay | Admitting: Internal Medicine

## 2013-10-09 VITALS — BP 128/65 | HR 67 | Temp 97.6°F | Resp 18 | Ht 68.0 in | Wt 166.7 lb

## 2013-10-09 DIAGNOSIS — Z800 Family history of malignant neoplasm of digestive organs: Secondary | ICD-10-CM

## 2013-10-09 DIAGNOSIS — Z801 Family history of malignant neoplasm of trachea, bronchus and lung: Secondary | ICD-10-CM

## 2013-10-09 DIAGNOSIS — C384 Malignant neoplasm of pleura: Secondary | ICD-10-CM

## 2013-10-09 DIAGNOSIS — Z77090 Contact with and (suspected) exposure to asbestos: Secondary | ICD-10-CM

## 2013-10-09 DIAGNOSIS — C450 Mesothelioma of pleura: Secondary | ICD-10-CM | POA: Insufficient documentation

## 2013-10-09 DIAGNOSIS — Z8042 Family history of malignant neoplasm of prostate: Secondary | ICD-10-CM

## 2013-10-09 MED ORDER — PROCHLORPERAZINE MALEATE 10 MG PO TABS: 10.0000 mg | ORAL_TABLET | Freq: Four times a day (QID) | ORAL | Status: DC | PRN

## 2013-10-09 MED ORDER — DEXAMETHASONE 4 MG PO TABS: ORAL_TABLET | ORAL | Status: DC

## 2013-10-09 MED ORDER — CYANOCOBALAMIN 1000 MCG/ML IJ SOLN
1000.0000 ug | Freq: Once | INTRAMUSCULAR | Status: AC
  Administered 2013-10-09: 1000 ug via INTRAMUSCULAR

## 2013-10-09 MED ORDER — FOLIC ACID 1 MG PO TABS: 1.0000 mg | ORAL_TABLET | Freq: Every day | ORAL | Status: DC

## 2013-10-09 NOTE — Telephone Encounter (Signed)
gv adn printed aptp sched and avs for ptf or June and July....sed added tx.

## 2013-10-09 NOTE — Progress Notes (Signed)
[place] Telephone:([phone]   [phone]  CONSULT NOTE  REFERRING PHYSICIAN: Dr. Modesto Charon  REASON FOR CONSULTATION:  80 years old white male recently diagnosed with malignant pleural mesothelioma.  HPI Sean Mitchell is a 80 y.o. male a nonsmoker with past medical history significant for hypertension, dyslipidemia, benign prostatic hypertrophy, osteoarthritis, and anxiety and history of colon polyps as well as remote history of asbestos exposure. For the last 2 months the patient has been complaining of increasing shortness of breath with cough. As well as right shoulder pain. He was seen by his primary care physician and was treated for questionable viral pneumonia with doxycycline, steroids and cough suppressant. He has no improvement in his condition and his shortness of breath was getting worse. He was admitted to [facility] on 09/19/2013 and chest x-ray at that time showed right lower lobe infiltrate consistent with pneumonia with associated moderate right sided pleural effusion. This was followed by CT scan of the chest on 09/19/2013 and it showed large right pleural effusion with compressive atelectasis of the adjacent portion of the right lung. There was no visible pleural thickening or pleural based masses. 5/22 2015 the patient underwent ultrasound-guided right thoracentesis with drainage of 1.2 L of yellow fluid.  MRI of the abdomen was performed on 09/21/2013 and it showed no suspicious renal findings but there were multiple renal cysts bilaterally and no acute abdominal findings. The patient was seen by Dr. Roxan Hockey and on 09/25/2013 he underwent a right VATS with drainage of right pleural effusion and pleural biopsies. The final pathology (Accession: [id number]) of the right pleural biopsy showed desmoplastic malignant mesothelioma. Outside consultation with Dr. Tedra Senegal from the pathology Department at [facility] confirms recurrent diagnoses. On 10/08/2013 the patient had a PET scan performed and it showed a right-sided pleural thickening with hypermetabolism was small-volume loculated right-sided pleural effusion and the small volume hydropneumothorax. The thickening and hypermetabolism are presumably related to mesothelioma. There was no evidence of thoracic nodal or extrathoracic disease.  Dr. Roxan Hockey kindly referred the patient to me today for further evaluation and recommendation regarding treatment of his condition. When seen today the patient continues to complain of shortness of breath increased with exertion as well as dry cough and occasional night sweats. He also has aching pain in the chest and back. He also complains of lack of energy.  Family history significant for father with colon cancer and mother died from heart disease. He also has 2 uncles with prostate cancer and another uncle with lung cancer. The patient is married and has 2 children. He was accompanied today by his wife Sean Mitchell and his daughter Sean Mitchell as well as his son-in-law. He also has a son named Sean Mitchell. He is currently retired and used to work for [employer]. He is a never smoker but drinks alcohol occasionally and no history of drug abuse. HPI  Past Medical History  Diagnosis Date  . Personal history of contact with and (suspected) exposure to asbestos   . Unspecified essential hypertension   . Other and unspecified hyperlipidemia   . Esophageal reflux   . Diverticulosis of colon (without mention of hemorrhage)   . Elevated prostate specific antigen (PSA)   . Osteoarthrosis, unspecified whether generalized or localized, unspecified site   . Anxiety state, unspecified   . Rectal mass   . Shortness of breath     due to fluid    Past Surgical History  Procedure  Laterality Date  . Inguinal hernia repair  1989    left  . Colonoscopy w/ polypectomy    . Video assisted thoracoscopy (vats)/decortication  Right 09/25/2013    Procedure: VIDEO ASSISTED THORACOSCOPY (VATS)/POSSIBLE DECORTICATION;  Surgeon: Melrose Nakayama, MD;  Location: [facility] OR;  Service: Thoracic;  Laterality: Right;  . Pleural effusion drainage Right 09/25/2013    Procedure: DRAINAGE OF PLEURAL EFFUSION;  Surgeon: Melrose Nakayama, MD;  Location: [place];  Service: Thoracic;  Laterality: Right;    Family History  Problem Relation Age of Onset  . Coronary artery disease Father   . Coronary artery disease Mother   . Heart attack Brother   . Colon cancer Father     ? not exactly sure    Social History History  Substance Use Topics  . Smoking status: Never Smoker   . Smokeless tobacco: Not on file  . Alcohol Use: No    Allergies  Allergen Reactions  . Other Swelling and Rash    Yellow Jackets    Current Outpatient Prescriptions  Medication Sig Dispense Refill  . aspirin 81 MG tablet Take 81 mg by mouth daily.        Marland Kitchen atenolol (TENORMIN) 25 MG tablet Take 25 mg by mouth daily.      Marland Kitchen EPINEPHrine (EPIPEN 2-PAK) 0.3 mg/0.3 mL DEVI Inject 0.3 mLs (0.3 mg total) into the muscle once.  1 Device  1  . esomeprazole (NEXIUM) 20 MG capsule Take 20 mg by mouth daily at 12 noon.      . finasteride (PROSCAR) 5 MG tablet Take 1 tablet (5 mg total) by mouth daily.  100 tablet  3  . fish oil-omega-3 fatty acids 1000 MG capsule Take 2 g by mouth 2 (two) times daily.       Marland Kitchen glucosamine-chondroitin 500-400 MG tablet Take 1 tablet by mouth 2 (two) times daily.       Marland Kitchen HYDROcodone-homatropine (HYCODAN) 5-1.5 MG/5ML syrup Take 5 mLs by mouth every 8 (eight) hours as needed for cough.  240 mL  0  . multivitamin (THERAGRAN) per tablet Take 1 tablet by mouth daily.        Marland Kitchen oxyCODONE (OXY IR/ROXICODONE) 5 MG immediate release tablet Take 1-2 tablets (5-10 mg total) by mouth every 6 (six) hours as needed for severe pain.  30 tablet  0  . pravastatin (PRAVACHOL) 40 MG tablet Take 1 tablet (40 mg total) by mouth every other day.  100  tablet  3  . terazosin (HYTRIN) 10 MG capsule Take 1 capsule (10 mg total) by mouth at bedtime.  100 capsule  3  . dexamethasone (DECADRON) 4 MG tablet 4 milligrams by mouth twice a day the day before, day of and day after the chemotherapy every 3 weeks  40 tablet  1  . folic acid (FOLVITE) 1 MG tablet Take 1 tablet (1 mg total) by mouth daily.  30 tablet  4  . prochlorperazine (COMPAZINE) 10 MG tablet Take 1 tablet (10 mg total) by mouth every 6 (six) hours as needed for nausea or vomiting.  60 tablet  0   Current Facility-Administered Medications  Medication Dose Route Frequency Provider Last Rate Last Dose  . cyanocobalamin ((VITAMIN B-12)) injection 1,000 mcg  1,000 mcg Intramuscular Once Curt Bears, MD        Review of Systems  Constitutional: positive for fatigue and sweats Eyes: negative Ears, nose, mouth, throat, and face: negative Respiratory: positive for cough, dyspnea on exertion and  pleurisy/chest pain Cardiovascular: negative Gastrointestinal: negative Genitourinary:negative Integument/breast: negative Hematologic/lymphatic: negative Musculoskeletal:negative Neurological: negative Behavioral/Psych: negative Endocrine: negative Allergic/Immunologic: negative  Physical Exam  BZJ:IRCVE, healthy, no distress, well nourished and well developed SKIN: skin color, texture, turgor are normal, no rashes or significant lesions HEAD: Normocephalic, No masses, lesions, tenderness or abnormalities EYES: normal, PERRLA EARS: External ears normal, Canals clear OROPHARYNX:no exudate, no erythema and lips, buccal mucosa, and tongue normal  NECK: supple, no adenopathy, no JVD LYMPH:  no palpable lymphadenopathy, no hepatosplenomegaly LUNGS: decreased breath sounds, dullness to percussion at the lower half of the right lung HEART: regular rate & rhythm, no murmurs and no gallops ABDOMEN:abdomen soft, non-tender, normal bowel sounds and no masses or organomegaly BACK: Back  symmetric, no curvature., No CVA tenderness EXTREMITIES:no joint deformities, effusion, or inflammation, no edema, no skin discoloration, no clubbing  NEURO: alert & oriented x 3 with fluent speech, no focal motor/sensory deficits  PERFORMANCE STATUS: ECOG 1  LABORATORY DATA: Lab Results  Component Value Date   WBC 7.4 09/27/2013   HGB 13.7 09/27/2013   HCT 40.6 09/27/2013   MCV 92.9 09/27/2013   PLT 178 09/27/2013      Chemistry      Component Value Date/Time   NA 135* 09/27/2013 0418   K 4.6 09/27/2013 0418   CL 102 09/27/2013 0418   CO2 23 09/27/2013 0418   BUN 14 09/27/2013 0418   CREATININE 1.11 09/27/2013 0418      Component Value Date/Time   CALCIUM 8.3* 09/27/2013 0418   ALKPHOS 61 09/27/2013 0418   AST 17 09/27/2013 0418   ALT 14 09/27/2013 0418   BILITOT 0.3 09/27/2013 0418       RADIOGRAPHIC STUDIES: Dg Chest 1 View  09/20/2013   CLINICAL DATA:  Status post right thoracentesis, 1.2 L.  EXAM: CHEST - 1 VIEW  COMPARISON:  DG CHEST 2 VIEW dated 09/19/2013; CT CHEST W/O CM dated 09/19/2013  FINDINGS: Residual right pleural effusion occupies about 60% of right hemithoracic volume. No visible pneumothorax.  Left lung appears clear. Cardiac and mediastinal margins appear normal.  IMPRESSION: 1. No pneumothorax status post thoracentesis. Residual right pleural effusion occupies about 60% of the right hemithorax.   Electronically Signed   By: Sherryl Barters M.D.   On: 09/20/2013 15:47   Dg Chest 2 View  09/24/2013   CLINICAL DATA:  History of right effusion.  EXAM: CHEST  2 VIEW  COMPARISON:  09/20/2013  FINDINGS: Cardiac shadow remains enlarged. The left lung is clear. Persistent effusion is noted on the right but mildly improved from the prior exam. Underlying atelectasis/ infiltrate is likely present as well.  IMPRESSION: Persistent changes in the right lung base consisting of effusion and atelectasis/infiltrate.   Electronically Signed   By: Inez Catalina M.D.   On: 09/24/2013 11:04    Dg Chest 2 View  09/19/2013   CLINICAL DATA:  PNEUMONIA SHORTNESS OF BREATH  EXAM: CHEST  2 VIEW  COMPARISON:  DG CHEST 2 VIEW dated 09/17/2013  FINDINGS: Cardiac silhouette is partially obscured by the stable moderate right pleural effusion. Stable right lower lobe infiltrate. Left lung is clear. No acute osseous abnormalities.  IMPRESSION: Stable right pleural effusion and right lower lobe infiltrate.   Electronically Signed   By: Margaree Mackintosh M.D.   On: 09/19/2013 18:45   Dg Chest 2 View  09/17/2013   CLINICAL DATA:  Pneumonia.  Cough.  EXAM: CHEST  2 VIEW  COMPARISON:  DG CHEST 2 VIEW  dated 09/02/2013  FINDINGS: Mediastinum and hilar structures normal. Right lower lobe infiltrate with a right-sided pleural effusion is present. Mild left base infiltrate cannot be excluded. No pneumothorax. Cardiomegaly. Pulmonary vascularity is normal. No acute bony abnormality.  IMPRESSION: 1. Right lower lobe infiltrate consistent with pneumonia. Associated moderate right-sided pleural effusion. 2. Mild infiltrate left lung base cannot be excluded.   Electronically Signed   By: [place]   On: 09/17/2013 15:44   Ct Chest Wo Contrast  09/19/2013   CLINICAL DATA:  Shortness of breath. Clinical diagnosis of pneumonia.  EXAM: CT CHEST WITHOUT CONTRAST  TECHNIQUE: Multidetector CT imaging of the chest was performed following the standard protocol without IV contrast.  COMPARISON:  Chest radiographs obtained earlier today.  FINDINGS: Large right pleural effusion. Compressive atelectasis of the adjacent portions of the right lung. No visible pleural thickening or pleural based masses. Atheromatous coronary artery calcifications. Minimal linear atelectasis or scarring at the left lung base. No lung nodules or enlarged lymph nodes seen.  Partially included left renal cysts. 2.1 x 1.8 cm exophytic medial upper right renal mass measuring 38 Hounsfield units in density on image number 65. Mild thoracic spine degenerative  changes.  IMPRESSION: 1. Large right pleural effusion. 2. Compressive atelectasis of the right lung, most pronounced involving the lower lobe. 3. Atheromatous coronary artery calcifications. 4. 2.1 cm exophytic right renal complex cyst or solid mass. If the patient's renal function allows it, elective pre and postcontrast CT or magnetic resonance imaging of the kidneys would be recommended.   Electronically Signed   By: Enrique Sack M.D.   On: 09/19/2013 21:54   Mr Abdomen W Wo Contrast  09/21/2013   CLINICAL DATA:  Follow up right renal mass demonstrated on chest CT.  EXAM: MRI ABDOMEN WITHOUT AND WITH CONTRAST  TECHNIQUE: Multiplanar multisequence MR imaging of the abdomen was performed both before and after the administration of intravenous contrast.  CONTRAST:  58mL MULTIHANCE GADOBENATE DIMEGLUMINE 529 MG/ML IV SOLN  COMPARISON:  Chest CT 09/19/2013.  FINDINGS: There are numerous bilateral renal cysts. By MRI, these all appear simple with uniformly increased T2 and decreased T1 signal. There is no enhancement, septations or mural nodularity of any of these lesions. The lesion of concern medially in the upper pole of the right kidney on CT which demonstrated mildly increased density measures 2.1 x 1.6 x 1.9 cm and appears simple. The largest cyst is exophytic, involving the lower pole of the right kidney, measuring 9.7 x 7.9 x 8.7 cm. There is no evidence of enhancing renal mass.  The liver, spleen, gallbladder, biliary system, pancreas and adrenal glands appear unremarkable. There is no abdominal lymphadenopathy. Degenerative disc disease is noted at L5-S1.  As seen on the recent CT, there is a large complex right pleural effusion with heterogeneous T2 signal, best seen on the coronal images. Post-contrast, there is thin pleural enhancement within the right hemithorax.  IMPRESSION: 1. No suspicious renal findings. There are multiple renal cysts bilaterally, all appearing simple by MRI. 2. No acute abdominal  findings. 3. Large complex right pleural effusion. Correlation with recent thoracentesis results recommended.   Electronically Signed   By: Camie Patience M.D.   On: 09/21/2013 09:16   Nm Pet Image Initial (pi) Skull Base To Thigh  10/08/2013   CLINICAL DATA:  Initial treatment strategy for staging of mesothelioma. Status post VATS and drainage of right-sided pleural effusion.  EXAM: NUCLEAR MEDICINE PET SKULL BASE TO THIGH  TECHNIQUE: 9.7 mCi  F-18 FDG was injected intravenously. Full-ring PET imaging was performed from the skull base to thigh after the radiotracer. CT data was obtained and used for attenuation correction and anatomic localization.  FASTING BLOOD GLUCOSE:  Value: 95 mg/dl  COMPARISON:  Chest radiograph of 09/30/2013. Abdominal MRI 09/21/2013. Chest CT of 09/19/2013. No prior PET.  FINDINGS: NECK  Hypermetabolism within the low right neck, including on image 47, is favored to be vascular or less likely muscular. No adenopathy in this area on today's study or the recent diagnostic CT.  CHEST  Circumferential right-sided pleural thickening with hypermetabolism. This is presumably secondary to mesothelioma and/or recent VATS. Index area of anterior right pleural thickening and hypermetabolism measures a S.U.V. max of 3.8 on image 64/series 4.  Posterior right sided hypermetabolism, adjacent to loculated right pleural fluid, measures a S.U.V. max of 4.6.  ABDOMEN/PELVIS  No areas of abnormal hypermetabolism.  SKELETON  No abnormal marrow activity.  CT IMAGES PERFORMED FOR ATTENUATION CORRECTION  Carotid atherosclerosis, greater on the left.  Mild cardiomegaly with coronary artery atherosclerosis. Small volume loculated right-sided pleural fluid medially. Decreased pleural fluid volume since 09/19/2013. Minimal right sided pleural air, including on image 94/series 4. Minimal left-sided pleural thickening also identified. Prominent prevascular nodes are not significantly changed and not hypermetabolic.  Example 8 mm on image 63. Inferior right lung patchy atelectasis.  Renal lesions which are better evaluated on recent MRI. Moderate prostatomegaly. Fat containing left inguinal hernia.  IMPRESSION: 1. Right-sided pleural thickening and hypermetabolism with small volume loculated right-sided pleural effusion and small volume hydropneumothorax. The thickening and hypermetabolism are presumably related to mesothelioma. Partially could be secondary to recent VATS. 2. No evidence of thoracic nodal or extrathoracic disease.   Electronically Signed   By: Abigail Miyamoto M.D.   On: 10/08/2013 16:37   Dg Chest Port 1 View  09/30/2013   CLINICAL DATA:  Post chest tube removal, evaluate for pneumothorax  EXAM: PORTABLE CHEST - 1 VIEW  COMPARISON:  Earlier same day; 09/29/2013; 09/28/2013; chest CT - 09/19/2013  FINDINGS: Grossly unchanged cardiac silhouette and mediastinal contours given persistently reduced lung volumes. Interval removal of right-sided chest tube without development of a pneumothorax. The amount of right lateral chest wall subcutaneous emphysema is grossly unchanged. Grossly unchanged bilateral infrahilar heterogeneous opacities. No pleural effusion or pneumothorax. No evidence of edema. No acute osseus abnormalities.  IMPRESSION: 1. Interval removal of right-sided chest tube without development of a pneumothorax. 2. Persistently reduced lung volumes grossly unchanged bibasilar opacities, right greater than left, atelectasis versus infiltrate.   Electronically Signed   By: Sandi Mariscal M.D.   On: 09/30/2013 13:16   Dg Chest Port 1 View  09/30/2013   CLINICAL DATA:  Atelectasis/ effusion  EXAM: PORTABLE CHEST - 1 VIEW  COMPARISON:  Sep 29, 2013  FINDINGS: There is a chest tube on the right, unchanged in position. There is subcutaneous emphysema on the right, stable. No pneumothorax is appreciable on this study. There is bibasilar atelectatic change, more on the right than on the left. There is no appreciable  effusion or consolidation. The heart size and pulmonary vascularity are normal.  IMPRESSION: Chest tube position on the right appears stable. There is subcutaneous emphysema on the right but no demonstrable pneumothorax. There is bibasilar atelectatic change, more on the right than on the left. No appreciable effusion or consolidation.   Electronically Signed   By: Lowella Grip M.D.   On: 09/30/2013 08:11   Dg Chest [facility]  09/29/2013   CLINICAL DATA:  Right-sided chest tube  EXAM: PORTABLE CHEST - 1 VIEW  COMPARISON:  09/28/2013  FINDINGS: Right-sided chest in unchanged position. No pneumothorax. Right basilar airspace opacity which may reflect atelectasis versus alveolar hemorrhage. Small right pleural effusion. Stable cardiomediastinal silhouette. Stable right chest wall subcutaneous emphysema. Unremarkable osseous structures.  IMPRESSION: 1. Right-sided chest tube in unchanged position. No pneumothorax. Right basilar airspace disease which may reflect atelectasis versus alveolar hemorrhage. Developing infiltrate not excluded.   Electronically Signed   By: Kathreen Devoid   On: 09/29/2013 08:37   Dg Chest Port 1 View  09/28/2013   CLINICAL DATA:  Status post VATS  EXAM: PORTABLE CHEST - 1 VIEW  COMPARISON:  09/27/2013  FINDINGS: Cardiac shadow is stable. Minimal left basilar atelectasis is again seen. Mild changes remain in the right lung base likely relating to a degree of atelectasis. Right-sided chest tube is again seen. No pneumothorax is noted. No sizable effusion is noted.  IMPRESSION: When compared with the prior exam, no significant interval change is noted.   Electronically Signed   By: Inez Catalina M.D.   On: 09/28/2013 07:43   Dg Chest Port 1 View  09/27/2013   CLINICAL DATA:  Chest tube  EXAM: PORTABLE CHEST - 1 VIEW  COMPARISON:  09/26/2013  FINDINGS: Cardiomediastinal silhouette is stable. Right chest tube is unchanged in position. Subcutaneous emphysema right chest wall is stable.  No pneumothorax. Mild right basilar atelectasis. Left lung is clear.  IMPRESSION: Stable right chest tube position. No pneumothorax. Left lung is clear. Mild right basilar atelectasis.   Electronically Signed   By: Lahoma Crocker M.D.   On: 09/27/2013 08:11   Dg Chest Port 1 View  09/26/2013   CLINICAL DATA:  Recent thoracotomy with effusion  EXAM: PORTABLE CHEST - 1 VIEW  COMPARISON:  Sep 25, 2013  FINDINGS: Chest tube remains on the right. There is subcutaneous emphysema on the right, but no appreciable pneumothorax. There is persistent patchy atelectasis in the right lower lung zone with small right effusion. There is mild atelectasis in the left lower lobe. Elsewhere lungs are clear. Heart size and pulmonary vascular normal. No adenopathy. No bone lesions.  IMPRESSION: No change in chest tube position on the right. There is subcutaneous emphysema on the right but no pneumothorax apparent. There is some new mild atelectasis left base. Patchy atelectasis in the right base region with small right effusion remain stable. No change in cardiac silhouette.   Electronically Signed   By: Lowella Grip M.D.   On: 09/26/2013 08:17   Dg Chest Port 1 View  09/25/2013   CLINICAL DATA:  Post VATS.  EXAM: PORTABLE CHEST - 1 VIEW  COMPARISON:  09/24/2013 and 09/20/2013.  FINDINGS: 1824 hr. Right chest tube in is in place. There is soft tissue emphysema laterally in the right chest wall. No significant pneumothorax is seen. There is a small residual right pleural effusion with right basilar pulmonary opacity. There is also mild left basilar atelectasis. The heart size is unchanged.  IMPRESSION: Decreased size of right pleural effusion following VATS. No pneumothorax.   Electronically Signed   By: Camie Patience M.D.   On: 09/25/2013 19:36   US Thoracentesis Asp Pleural Space W/img Guide  09/20/2013   CLINICAL DATA:  Right pleural effusion, request for thoracentesis.  EXAM: ULTRASOUND GUIDED right THORACENTESIS  COMPARISON:   None.  PROCEDURE: An ultrasound guided thoracentesis was thoroughly discussed with the patient and questions answered. The  benefits, risks, alternatives and complications were also discussed. The patient understands and wishes to proceed with the procedure. Written consent was obtained.  Ultrasound was performed to localize and mark an adequate pocket of fluid in the right chest. The area was then prepped and draped in the normal sterile fashion. 1% Lidocaine was used for local anesthesia. Under ultrasound guidance a 19 gauge Yueh catheter was introduced. Thoracentesis was performed. The catheter was removed and a dressing applied.  Complications:  None.  FINDINGS: A total of approximately 1.2 liters of yellow fluid was removed. A fluid sample was sent for laboratory analysis.  IMPRESSION: Successful ultrasound guided right thoracentesis yielding 1.2 liters of pleural fluid.  Read By:  Tsosie Billing PA-C   Electronically Signed   By: Maryclare Bean M.D.   On: 09/20/2013 16:22    ASSESSMENT: This is a very pleasant 80 years old white male recently diagnosed with malignant pleural mesothelioma questionable for stage II involving the right pleura. He is status post VATS with right-sided pleural fluid drainage and pleural biopsies.   PLAN: I had a lengthy discussion with the patient and his family today about his current disease stage, prognosis and treatment options. I showed them the images of the PET scan. I discussed with the patient systemic chemotherapy for his treatment either as neoadjuvant therapy followed by surgical evaluation versus palliative systemic chemotherapy. I recommended for him regimen consisting of cisplatin 75 mg/M2 and Alimta 500 mg/M2 every 3 weeks. I discussed with the patient adverse effect of the chemotherapy including but not limited to alopecia, myelosuppression, nausea and vomiting, peripheral neuropathy, liver or renal dysfunction. I also discussed with the patient referral to [facility] for surgical evaluation.  He would like to proceed with the systemic chemotherapy as soon as possible but also agreed to see a thoracic surgeon at [facility] for second opinion regarding surgical resection. I will arrange for the patient to receive the first cycle of his systemic chemotherapy next week. He'll receive vitamin B12 injection today. I will Escribe folic acid 1 mg by mouth daily, Decadron 4 mg by mouth twice a day the day before, day of and day after the chemotherapy in addition to Compazine 10 mg by mouth every 6 hours as needed for nausea. I will arrange for the patient to have a chemotherapy education class before starting the first dose of his treatment. She will come back for followup visit in 2 weeks for reevaluation and management any adverse effect of his chemotherapy. I gave the patient and his family the time to ask questions and I answered them completely to their satisfaction. He was advised to call immediately if he has any concerning symptoms in the interval.  The patient voices understanding of current disease status and treatment options and is in agreement with the current care plan.  All questions were answered. The patient knows to call the clinic with any problems, questions or concerns. We can certainly see the patient much sooner if necessary.  Thank you so much for allowing me to participate in the care of Ammiel Guiney.. I will continue to follow up the patient with you and assist in his care.  I spent 55 minutes counseling the patient face to face. The total time spent in the appointment was 80 minutes.  Disclaimer: This note was dictated with voice recognition software. Similar sounding words can inadvertently be transcribed and may not be corrected upon review.   Aarvi Stotts K. 10/09/2013, 1:04 PM

## 2013-10-09 NOTE — Telephone Encounter (Signed)
gv adnr pinted appt sched and avs for opt for June and July...sed added tx....referral to [facility] sent to [place] [facility]

## 2013-10-09 NOTE — Telephone Encounter (Signed)
SPOKE WITH SABRINA @ DR[facility] OFFICE [phone]. FAXED MEDICAL RECORDS TO [phone] FOR REVIEW OFFICE WILL CONTACT PATIENT WITH APPOINTMENT PER SABRINA.

## 2013-10-10 ENCOUNTER — Encounter: Payer: Self-pay | Admitting: *Deleted

## 2013-10-10 ENCOUNTER — Other Ambulatory Visit: Payer: Medicare Other

## 2013-10-11 ENCOUNTER — Other Ambulatory Visit: Payer: Self-pay | Admitting: Thoracic Surgery (Cardiothoracic Vascular Surgery)

## 2013-10-11 DIAGNOSIS — C3490 Malignant neoplasm of unspecified part of unspecified bronchus or lung: Secondary | ICD-10-CM

## 2013-10-14 ENCOUNTER — Other Ambulatory Visit: Payer: Self-pay | Admitting: *Deleted

## 2013-10-15 ENCOUNTER — Ambulatory Visit (HOSPITAL_BASED_OUTPATIENT_CLINIC_OR_DEPARTMENT_OTHER): Payer: Medicare Other

## 2013-10-15 ENCOUNTER — Other Ambulatory Visit (HOSPITAL_BASED_OUTPATIENT_CLINIC_OR_DEPARTMENT_OTHER): Payer: Medicare Other

## 2013-10-15 ENCOUNTER — Encounter: Payer: Medicare Other | Admitting: Family Medicine

## 2013-10-15 ENCOUNTER — Ambulatory Visit (INDEPENDENT_AMBULATORY_CARE_PROVIDER_SITE_OTHER): Payer: Self-pay | Admitting: Thoracic Surgery (Cardiothoracic Vascular Surgery)

## 2013-10-15 ENCOUNTER — Encounter: Payer: Self-pay | Admitting: Thoracic Surgery (Cardiothoracic Vascular Surgery)

## 2013-10-15 ENCOUNTER — Ambulatory Visit
Admission: RE | Admit: 2013-10-15 | Discharge: 2013-10-15 | Disposition: A | Discharge status: Home / Self Care | Payer: Medicare Other | Source: Ambulatory Visit | Attending: Thoracic Surgery (Cardiothoracic Vascular Surgery) | Admitting: Thoracic Surgery (Cardiothoracic Vascular Surgery)

## 2013-10-15 VITALS — BP 119/65 | HR 68 | Resp 16 | Ht 68.0 in | Wt 160.0 lb

## 2013-10-15 VITALS — BP 125/72 | HR 59 | Temp 97.0°F | Resp 18

## 2013-10-15 DIAGNOSIS — C384 Malignant neoplasm of pleura: Secondary | ICD-10-CM

## 2013-10-15 DIAGNOSIS — C450 Mesothelioma of pleura: Secondary | ICD-10-CM

## 2013-10-15 DIAGNOSIS — C3490 Malignant neoplasm of unspecified part of unspecified bronchus or lung: Secondary | ICD-10-CM

## 2013-10-15 DIAGNOSIS — Z09 Encounter for follow-up examination after completed treatment for conditions other than malignant neoplasm: Secondary | ICD-10-CM

## 2013-10-15 DIAGNOSIS — Z5111 Encounter for antineoplastic chemotherapy: Secondary | ICD-10-CM

## 2013-10-15 DIAGNOSIS — C801 Malignant (primary) neoplasm, unspecified: Secondary | ICD-10-CM

## 2013-10-15 DIAGNOSIS — C459 Mesothelioma, unspecified: Secondary | ICD-10-CM

## 2013-10-15 LAB — CBC WITH DIFFERENTIAL/PLATELET
BASO%: 0.1 % (ref 0.0–2.0)
BASOS ABS: 0 10*3/uL (ref 0.0–0.1)
EOS ABS: 0 10*3/uL (ref 0.0–0.5)
EOS%: 0 % (ref 0.0–7.0)
HEMATOCRIT: 39.2 % (ref 38.4–49.9)
HEMOGLOBIN: 13.2 g/dL (ref 13.0–17.1)
LYMPH#: 1.3 10*3/uL (ref 0.9–3.3)
LYMPH%: 10 % — ABNORMAL LOW (ref 14.0–49.0)
MCH: 30.4 pg (ref 27.2–33.4)
MCHC: 33.7 g/dL (ref 32.0–36.0)
MCV: 90.1 fL (ref 79.3–98.0)
MONO#: 0.8 10*3/uL (ref 0.1–0.9)
MONO%: 5.9 % (ref 0.0–14.0)
NEUT%: 84 % — ABNORMAL HIGH (ref 39.0–75.0)
NEUTROS ABS: 10.6 10*3/uL — AB (ref 1.5–6.5)
Platelets: 324 10*3/uL (ref 140–400)
RBC: 4.34 10*6/uL (ref 4.20–5.82)
RDW: 13.4 % (ref 11.0–14.6)
WBC: 12.7 10*3/uL — ABNORMAL HIGH (ref 4.0–10.3)

## 2013-10-15 LAB — COMPREHENSIVE METABOLIC PANEL (CC13)
ALBUMIN: 2.6 g/dL — AB (ref 3.5–5.0)
ALT: 14 U/L (ref 0–55)
AST: 15 U/L (ref 5–34)
Alkaline Phosphatase: 75 U/L (ref 40–150)
Anion Gap: 9 mEq/L (ref 3–11)
BUN: 19.4 mg/dL (ref 7.0–26.0)
CALCIUM: 9.3 mg/dL (ref 8.4–10.4)
CHLORIDE: 103 meq/L (ref 98–109)
CO2: 24 mEq/L (ref 22–29)
Creatinine: 0.9 mg/dL (ref 0.7–1.3)
Glucose: 131 mg/dl (ref 70–140)
POTASSIUM: 4 meq/L (ref 3.5–5.1)
SODIUM: 136 meq/L (ref 136–145)
TOTAL PROTEIN: 6.5 g/dL (ref 6.4–8.3)
Total Bilirubin: 0.22 mg/dL (ref 0.20–1.20)

## 2013-10-15 LAB — MAGNESIUM (CC13): Magnesium: 2.1 mg/dl (ref 1.5–2.5)

## 2013-10-15 MED ORDER — SODIUM CHLORIDE 0.9 % IV SOLN
75.0000 mg/m2 | Freq: Once | INTRAVENOUS | Status: AC
  Administered 2013-10-15: 143 mg via INTRAVENOUS
  Filled 2013-10-15: qty 143

## 2013-10-15 MED ORDER — PALONOSETRON HCL INJECTION 0.25 MG/5ML
0.2500 mg | Freq: Once | INTRAVENOUS | Status: AC
  Administered 2013-10-15: 0.25 mg via INTRAVENOUS

## 2013-10-15 MED ORDER — POTASSIUM CHLORIDE 2 MEQ/ML IV SOLN
Freq: Once | INTRAVENOUS | Status: AC
  Administered 2013-10-15: 13:00:00 via INTRAVENOUS
  Filled 2013-10-15: qty 10

## 2013-10-15 MED ORDER — SODIUM CHLORIDE 0.9 % IV SOLN
500.0000 mg/m2 | Freq: Once | INTRAVENOUS | Status: AC
  Administered 2013-10-15: 950 mg via INTRAVENOUS
  Filled 2013-10-15: qty 38

## 2013-10-15 MED ORDER — DEXAMETHASONE SODIUM PHOSPHATE 20 MG/5ML IJ SOLN
12.0000 mg | Freq: Once | INTRAMUSCULAR | Status: AC
  Administered 2013-10-15: 12 mg via INTRAVENOUS

## 2013-10-15 MED ORDER — DEXAMETHASONE SODIUM PHOSPHATE 20 MG/5ML IJ SOLN
INTRAMUSCULAR | Status: AC
  Filled 2013-10-15: qty 5

## 2013-10-15 MED ORDER — SODIUM CHLORIDE 0.9 % IV SOLN
150.0000 mg | Freq: Once | INTRAVENOUS | Status: AC
  Administered 2013-10-15: 150 mg via INTRAVENOUS
  Filled 2013-10-15: qty 5

## 2013-10-15 MED ORDER — PALONOSETRON HCL INJECTION 0.25 MG/5ML
INTRAVENOUS | Status: AC
  Filled 2013-10-15: qty 5

## 2013-10-15 NOTE — Progress Notes (Signed)
Pt with elevated WBC today 12.7. Pt denies fever, chills or shaking. No obvious signs of infection. Dr. Julien Nordmann has reviewed today's labs. OK to treat per MD.

## 2013-10-15 NOTE — Patient Instructions (Signed)
Sean Mitchell Discharge Instructions for Patients Receiving Chemotherapy  Today you received the following chemotherapy agents alimta/cisplatin.    To help prevent nausea and vomiting after your treatment, we encourage you to take your nausea medication as directed.    If you develop nausea and vomiting that is not controlled by your nausea medication, call the clinic.   BELOW ARE SYMPTOMS THAT SHOULD BE REPORTED IMMEDIATELY:  *FEVER GREATER THAN 100.5 F  *CHILLS WITH OR WITHOUT FEVER  NAUSEA AND VOMITING THAT IS NOT CONTROLLED WITH YOUR NAUSEA MEDICATION  *UNUSUAL SHORTNESS OF BREATH  *UNUSUAL BRUISING OR BLEEDING  TENDERNESS IN MOUTH AND THROAT WITH OR WITHOUT PRESENCE OF ULCERS  *URINARY PROBLEMS  *BOWEL PROBLEMS  UNUSUAL RASH Items with * indicate a potential emergency and should be followed up as soon as possible.  Feel free to call the clinic you have any questions or concerns. The clinic phone number is [phone].

## 2013-10-15 NOTE — Progress Notes (Signed)
HPI:  Sean Mitchell is a 80 year old gentleman who presented earlier this spring with a rapidly recurring right pleural effusion. He had a right thoracoscopic drainage of effusion and pleural biopsy on the 27th. The takeoff of the pathology come back but it did turn out to be mesothelioma. He had a PET/CT which showed the disease was isolated to the right chest.  He says that he is breathing better than he was before the procedure. However he still like he cannot get a full, deep breath. He is having some pain and is taking oxycodone for that occasionally, mostly at night before he goes to bed.  He saw Dr. Julien Nordmann last week. He is to have lab work this morning and then will start chemotherapy this afternoon.  Past Medical History  Diagnosis Date  . Personal history of contact with and (suspected) exposure to asbestos   . Unspecified essential hypertension   . Other and unspecified hyperlipidemia   . Esophageal reflux   . Diverticulosis of colon (without mention of hemorrhage)   . Elevated prostate specific antigen (PSA)   . Osteoarthrosis, unspecified whether generalized or localized, unspecified site   . Anxiety state, unspecified   . Rectal mass   . Shortness of breath     due to fluid      Current Outpatient Prescriptions  Medication Sig Dispense Refill  . aspirin 81 MG tablet Take 81 mg by mouth daily.        Marland Kitchen atenolol (TENORMIN) 25 MG tablet Take 25 mg by mouth daily.      Marland Kitchen dexamethasone (DECADRON) 4 MG tablet 4 milligrams by mouth twice a day the day before, day of and day after the chemotherapy every 3 weeks  40 tablet  1  . EPINEPHrine (EPIPEN 2-PAK) 0.3 mg/0.3 mL DEVI Inject 0.3 mLs (0.3 mg total) into the muscle once.  1 Device  1  . esomeprazole (NEXIUM) 20 MG capsule Take 20 mg by mouth daily at 12 noon.      . finasteride (PROSCAR) 5 MG tablet Take 1 tablet (5 mg total) by mouth daily.  100 tablet  3  . fish oil-omega-3 fatty acids 1000 MG capsule Take 2 g by mouth 2  (two) times daily.       . folic acid (FOLVITE) 1 MG tablet Take 1 tablet (1 mg total) by mouth daily.  30 tablet  4  . glucosamine-chondroitin 500-400 MG tablet Take 1 tablet by mouth 2 (two) times daily.       Marland Kitchen HYDROcodone-homatropine (HYCODAN) 5-1.5 MG/5ML syrup Take 5 mLs by mouth every 8 (eight) hours as needed for cough.  240 mL  0  . multivitamin (THERAGRAN) per tablet Take 1 tablet by mouth daily.        Marland Kitchen oxyCODONE (OXY IR/ROXICODONE) 5 MG immediate release tablet Take 1-2 tablets (5-10 mg total) by mouth every 6 (six) hours as needed for severe pain.  30 tablet  0  . pravastatin (PRAVACHOL) 40 MG tablet Take 1 tablet (40 mg total) by mouth every other day.  100 tablet  3  . prochlorperazine (COMPAZINE) 10 MG tablet Take 1 tablet (10 mg total) by mouth every 6 (six) hours as needed for nausea or vomiting.  60 tablet  0  . terazosin (HYTRIN) 10 MG capsule Take 1 capsule (10 mg total) by mouth at bedtime.  100 capsule  3   No current facility-administered medications for this visit.    Physical Exam BP 119/65  Pulse 68  Resp 16  Ht 5\' 8"  (1.727 m)  Wt 160 lb (72.576 kg)  BMI 24.33 kg/m2  SpO40 61% 80 year old male in no acute distress Cardiac regular rate and rhythm Lungs slightly diminished right base, otherwise clear Incisions healing well  Diagnostic Tests: CHEST 2 VIEW  COMPARISON: 10/08/2013 CT scan/PET  FINDINGS:  Heart size and vascular pattern are normal. Left lung is clear.  Small known loculated right pleural effusions stable from recent  prior CT scan. Mild mid lung discoid atelectasis also similar to CT  scan.  IMPRESSION:  Small loculated pleural effusion with pleural thickening, as well as  mild mid lung discoid atelectasis.  Electronically Signed  By: Skipper Cliche M.D.  On: 10/15/2013 10:09         Impression: 80 year old man recently diagnosed with mesothelioma. It is isolated to his right pleura presently. He is recovering well from his pleural  biopsy and drainage of his pleural effusion.   He will start chemotherapy today.  I talked to him about possibly going to do to see the surgeons regarding a possible resection. That most likely would be an extrapleural pneumonectomy, although I guess a pleurectomy is not out of the question. He seems very reluctant to consider surgery. My advice to him was to go and at least explore the possibility so that he doesn't have a rectum afterwards.  Plan: I will be happy to see [name] back if I can be of any further assistance with his care

## 2013-10-16 ENCOUNTER — Telehealth: Payer: Self-pay | Admitting: Internal Medicine

## 2013-10-16 ENCOUNTER — Telehealth: Payer: Self-pay | Admitting: *Deleted

## 2013-10-16 NOTE — Telephone Encounter (Signed)
Faxed pt medical records to Dr. Elenor Quinones @ [place]. Dr. Elenor Quinones will review records and his office will call with appt.

## 2013-10-16 NOTE — Telephone Encounter (Signed)
Spoke with pt today for post chemo follow up call.  Pt denied nausea/vomiting.  Pt stated he took compazine during night just for preventative.  Stated appetite good, and drinking lots of fluids as tolerated.  Denied pain.  Stated bowel and bladder function fine.  Reinforced need to increase po fluids as tolerated.  Reinforced with pt not to drive after taking Compazine and Pain meds since these meds can cause drowsiness.  Pt stated he had been taking Centrum Multivitamin daily, but has stopped a few days prior to chemo treatment.  Pt wanted to know if he could resume vitamin again.  Pt stated he is taking Folic acid daily as instructed by md.  Clydia Llano, desk nurse notified.  Spoke with pt again and informed pt that it is fine for pt to resume Centrum multivitamin as per desk nurse.  Pt also aware of next return appt. Pt stated he had good experience with first chemo treatment yesterday.  Staff were courteous, nice, and provided explanations to pt throughout his chemo.  No other concerns at this time.

## 2013-10-21 ENCOUNTER — Telehealth: Payer: Self-pay | Admitting: Internal Medicine

## 2013-10-21 NOTE — Telephone Encounter (Signed)
Pt appt. To see Dr. Elenor Quinones at [facility] is 10/29/13@12 :00. Pt is aware

## 2013-10-22 ENCOUNTER — Encounter: Payer: Self-pay | Admitting: Physician Assistant

## 2013-10-22 ENCOUNTER — Ambulatory Visit (HOSPITAL_BASED_OUTPATIENT_CLINIC_OR_DEPARTMENT_OTHER): Payer: Medicare Other | Admitting: Physician Assistant

## 2013-10-22 ENCOUNTER — Other Ambulatory Visit (HOSPITAL_BASED_OUTPATIENT_CLINIC_OR_DEPARTMENT_OTHER): Payer: Medicare Other

## 2013-10-22 VITALS — BP 135/66 | HR 58 | Temp 98.4°F | Resp 18 | Ht 68.0 in | Wt 160.6 lb

## 2013-10-22 DIAGNOSIS — C450 Mesothelioma of pleura: Secondary | ICD-10-CM

## 2013-10-22 DIAGNOSIS — C384 Malignant neoplasm of pleura: Secondary | ICD-10-CM

## 2013-10-22 LAB — COMPREHENSIVE METABOLIC PANEL (CC13)
ALK PHOS: 74 U/L (ref 40–150)
ALT: 11 U/L (ref 0–55)
AST: 16 U/L (ref 5–34)
Albumin: 2.7 g/dL — ABNORMAL LOW (ref 3.5–5.0)
Anion Gap: 6 mEq/L (ref 3–11)
BILIRUBIN TOTAL: 0.3 mg/dL (ref 0.20–1.20)
BUN: 21.3 mg/dL (ref 7.0–26.0)
CO2: 30 mEq/L — ABNORMAL HIGH (ref 22–29)
Calcium: 9 mg/dL (ref 8.4–10.4)
Chloride: 101 mEq/L (ref 98–109)
Creatinine: 1.2 mg/dL (ref 0.7–1.3)
GLUCOSE: 98 mg/dL (ref 70–140)
Potassium: 4.6 mEq/L (ref 3.5–5.1)
Sodium: 137 mEq/L (ref 136–145)
Total Protein: 6.2 g/dL — ABNORMAL LOW (ref 6.4–8.3)

## 2013-10-22 LAB — CBC WITH DIFFERENTIAL/PLATELET
BASO%: 0.4 % (ref 0.0–2.0)
Basophils Absolute: 0 10*3/uL (ref 0.0–0.1)
EOS ABS: 0.2 10*3/uL (ref 0.0–0.5)
EOS%: 3.6 % (ref 0.0–7.0)
HCT: 41.2 % (ref 38.4–49.9)
HGB: 13.5 g/dL (ref 13.0–17.1)
LYMPH%: 39.7 % (ref 14.0–49.0)
MCH: 29.6 pg (ref 27.2–33.4)
MCHC: 32.8 g/dL (ref 32.0–36.0)
MCV: 90 fL (ref 79.3–98.0)
MONO#: 0.3 10*3/uL (ref 0.1–0.9)
MONO%: 6.4 % (ref 0.0–14.0)
NEUT%: 49.9 % (ref 39.0–75.0)
NEUTROS ABS: 2.5 10*3/uL (ref 1.5–6.5)
PLATELETS: 180 10*3/uL (ref 140–400)
RBC: 4.57 10*6/uL (ref 4.20–5.82)
RDW: 13.8 % (ref 11.0–14.6)
WBC: 5.1 10*3/uL (ref 4.0–10.3)
lymph#: 2 10*3/uL (ref 0.9–3.3)

## 2013-10-22 LAB — MAGNESIUM (CC13): MAGNESIUM: 2.1 mg/dL (ref 1.5–2.5)

## 2013-10-22 MED ORDER — TRAMADOL HCL 50 MG PO TABS: 50.0000 mg | ORAL_TABLET | Freq: Four times a day (QID) | ORAL | Status: DC | PRN

## 2013-10-22 NOTE — Progress Notes (Addendum)
No images are attached to the encounter. No scans are attached to the encounter. No scans are attached to the encounter. [place] VISIT PROGRESS NOTE  Sean Man, MD [place] [address]  DIAGNOSIS: Malignant pleural mesothelioma   Primary site: Pleural Mesothelioma (Right)   Staging method: AJCC 7th Edition   Clinical: Stage II (T2, N0, M0) signed by Curt Bears, MD on 10/09/2013 12:58 PM   Summary: Stage II (T2, N0, M0)  PRIOR THERAPY: Status post right fax with drainage of right pleural effusion and pleural biopsies on 09/25/2013 the care of Dr. Roxan Hockey  CURRENT THERAPY: Systemic chemotherapy with cisplatin 75 mg/m2 and Alimta 500 mg/m2 given every 3 weeks. Status post 1 cycle  INTERVAL HISTORY: Sean Mitchell. 80 y.o. male returns for a scheduled regular symptom management visit for followup of his recently diagnosed malignant pleural mesothelioma. Patient is accompanied by his wife [name] and his daughter [name] today. He reports that nausea without vomiting for approximately 3 days after chemotherapy significant malaise lasting approximately a week as well as constipation. He states he took a laxative on Friday and had bowel movements Friday evening and Saturday but did have another one until today. He complains of continued of right shoulder pain and notes that his voice has become weak. He complains of trouble sleeping. He is wondering whether it's related to some anxiety he feels that he can't quite stopped thinking about things. He requests a copy of his pathology as well as his PET scan. The oxycodone is taking for pain management he feels might be a little bit stronger and is wondering if they're something else you might take. He overall has had improvement in his cough. He denied fever or chills. He's had no night sweats or significant weight loss. His appetite is fair.  MEDICAL HISTORY: Past Medical History  Diagnosis  Date  . Personal history of contact with and (suspected) exposure to asbestos   . Unspecified essential hypertension   . Other and unspecified hyperlipidemia   . Esophageal reflux   . Diverticulosis of colon (without mention of hemorrhage)   . Elevated prostate specific antigen (PSA)   . Osteoarthrosis, unspecified whether generalized or localized, unspecified site   . Anxiety state, unspecified   . Rectal mass   . Shortness of breath     due to fluid    ALLERGIES:  is allergic to other.  MEDICATIONS:  Current Outpatient Prescriptions  Medication Sig Dispense Refill  . atenolol (TENORMIN) 25 MG tablet Take 25 mg by mouth daily.      Marland Kitchen dexamethasone (DECADRON) 4 MG tablet 4 milligrams by mouth twice a day the day before, day of and day after the chemotherapy every 3 weeks  40 tablet  1  . esomeprazole (NEXIUM) 20 MG capsule Take 20 mg by mouth daily at 12 noon.      . finasteride (PROSCAR) 5 MG tablet Take 1 tablet (5 mg total) by mouth daily.  151 tablet  3  . folic acid (FOLVITE) 1 MG tablet Take 1 tablet (1 mg total) by mouth daily.  30 tablet  4  . HYDROcodone-homatropine (HYCODAN) 5-1.5 MG/5ML syrup Take 5 mLs by mouth every 8 (eight) hours as needed for cough.  240 mL  0  . multivitamin (THERAGRAN) per tablet Take 1 tablet by mouth daily.        . pravastatin (PRAVACHOL) 40 MG tablet Take 1 tablet (40 mg total) by mouth every  other day.  100 tablet  3  . terazosin (HYTRIN) 10 MG capsule Take 1 capsule (10 mg total) by mouth at bedtime.  100 capsule  3  . aspirin 81 MG tablet Take 81 mg by mouth daily.        Marland Kitchen EPINEPHrine (EPIPEN 2-PAK) 0.3 mg/0.3 mL DEVI Inject 0.3 mLs (0.3 mg total) into the muscle once.  1 Device  1  . fish oil-omega-3 fatty acids 1000 MG capsule Take 2 g by mouth 2 (two) times daily.       Marland Kitchen glucosamine-chondroitin 500-400 MG tablet Take 1 tablet by mouth 2 (two) times daily.       Marland Kitchen oxyCODONE (OXY IR/ROXICODONE) 5 MG immediate release tablet Take 1-2 tablets  (5-10 mg total) by mouth every 6 (six) hours as needed for severe pain.  30 tablet  0  . prochlorperazine (COMPAZINE) 10 MG tablet Take 1 tablet (10 mg total) by mouth every 6 (six) hours as needed for nausea or vomiting.  60 tablet  0  . traMADol (ULTRAM) 50 MG tablet Take 1 tablet (50 mg total) by mouth every 6 (six) hours as needed.  30 tablet  0   No current facility-administered medications for this visit.    SURGICAL HISTORY:  Past Surgical History  Procedure Laterality Date  . Inguinal hernia repair  1989    left  . Colonoscopy w/ polypectomy    . Video assisted thoracoscopy (vats)/decortication Right 09/25/2013    Procedure: VIDEO ASSISTED THORACOSCOPY (VATS)/POSSIBLE DECORTICATION;  Surgeon: Melrose Nakayama, MD;  Location: [facility];  Service: Thoracic;  Laterality: Right;  . Pleural effusion drainage Right 09/25/2013    Procedure: DRAINAGE OF PLEURAL EFFUSION;  Surgeon: Melrose Nakayama, MD;  Location: [facility];  Service: Thoracic;  Laterality: Right;    REVIEW OF SYSTEMS:  Constitutional: positive for fatigue and malaise Eyes: negative Ears, nose, mouth, throat, and face: negative Respiratory: positive for cough Cardiovascular: negative Gastrointestinal: positive for constipation and nausea Genitourinary:negative Integument/breast: negative Hematologic/lymphatic: negative Musculoskeletal:positive for right shoulder pain and occasional right rib cage pain Neurological: negative Behavioral/Psych: positive for anxiety Endocrine: negative Allergic/Immunologic: negative   PHYSICAL EXAMINATION: General appearance: alert, cooperative, appears stated age and no distress Head: Normocephalic, without obvious abnormality, atraumatic Neck: no adenopathy, no carotid bruit, no JVD, supple, symmetrical, trachea midline and thyroid not enlarged, symmetric, no tenderness/mass/nodules Lymph nodes: Cervical, supraclavicular, and axillary nodes normal. Resp: clear to auscultation  bilaterally Back: symmetric, no curvature. ROM normal. No CVA tenderness. Cardio: regular rate and rhythm, S1, S2 normal, no murmur, click, rub or gallop GI: soft, non-tender; bowel sounds normal; no masses,  no organomegaly Extremities: edema trace pitting edema bilateral lower extremities Neurologic: Alert and oriented X 3, normal strength and tone. Normal symmetric reflexes. Normal coordination and gait  ECOG PERFORMANCE STATUS: 1 - Symptomatic but completely ambulatory  Blood pressure 135/66, pulse 58, temperature 98.4 F (36.9 C), temperature source Oral, resp. rate 18, height 5\' 8"  (1.727 m), weight 160 lb 9.6 oz (72.848 kg), SpO2 97.00%.  LABORATORY DATA: Lab Results  Component Value Date   WBC 5.1 10/22/2013   HGB 13.5 10/22/2013   HCT 41.2 10/22/2013   MCV 90.0 10/22/2013   PLT 180 10/22/2013      Chemistry      Component Value Date/Time   NA 137 10/22/2013 1305   NA 135* 09/27/2013 0418   K 4.6 10/22/2013 1305   K 4.6 09/27/2013 0418   CL 102 09/27/2013 0418  CO2 30* 10/22/2013 1305   CO2 23 09/27/2013 0418   BUN 21.3 10/22/2013 1305   BUN 14 09/27/2013 0418   CREATININE 1.2 10/22/2013 1305   CREATININE 1.11 09/27/2013 0418      Component Value Date/Time   CALCIUM 9.0 10/22/2013 1305   CALCIUM 8.3* 09/27/2013 0418   ALKPHOS 74 10/22/2013 1305   ALKPHOS 61 09/27/2013 0418   AST 16 10/22/2013 1305   AST 17 09/27/2013 0418   ALT 11 10/22/2013 1305   ALT 14 09/27/2013 0418   BILITOT 0.30 10/22/2013 1305   BILITOT 0.3 09/27/2013 0418       RADIOGRAPHIC STUDIES:  Dg Chest 2 View  10/15/2013   CLINICAL DATA:  Right-sided malignant pleural nasal glioma with chest soreness  EXAM: CHEST  2 VIEW  COMPARISON:  10/08/2013 CT scan/PET  FINDINGS: Heart size and vascular pattern are normal. Left lung is clear. Small known loculated right pleural effusions stable from recent prior CT scan. Mild mid lung discoid atelectasis also similar to CT scan.  IMPRESSION: Small loculated pleural effusion  with pleural thickening, as well as mild mid lung discoid atelectasis.   Electronically Signed   By: Skipper Cliche M.D.   On: 10/15/2013 10:09   Dg Chest 2 View  09/24/2013   CLINICAL DATA:  History of right effusion.  EXAM: CHEST  2 VIEW  COMPARISON:  09/20/2013  FINDINGS: Cardiac shadow remains enlarged. The left lung is clear. Persistent effusion is noted on the right but mildly improved from the prior exam. Underlying atelectasis/ infiltrate is likely present as well.  IMPRESSION: Persistent changes in the right lung base consisting of effusion and atelectasis/infiltrate.   Electronically Signed   By: Inez Catalina M.D.   On: 09/24/2013 11:04   Nm Pet Image Initial (pi) Skull Base To Thigh  10/10/2013   ADDENDUM REPORT: 10/10/2013 10:43  ADDENDUM: As detailed in the findings section, hypermetabolism at the right supraclavicular region. On image 46, a small node is identified in this area (9 mm) and isolated nodal metastasis cannot be excluded.   Electronically Signed   By: Abigail Miyamoto M.D.   On: 10/10/2013 10:43   10/10/2013   CLINICAL DATA:  Initial treatment strategy for staging of mesothelioma. Status post VATS and drainage of right-sided pleural effusion.  EXAM: NUCLEAR MEDICINE PET SKULL BASE TO THIGH  TECHNIQUE: 9.7 mCi F-18 FDG was injected intravenously. Full-ring PET imaging was performed from the skull base to thigh after the radiotracer. CT data was obtained and used for attenuation correction and anatomic localization.  FASTING BLOOD GLUCOSE:  Value: 95 mg/dl  COMPARISON:  Chest radiograph of 09/30/2013. Abdominal MRI 09/21/2013. Chest CT of 09/19/2013. No prior PET.  FINDINGS: NECK  Hypermetabolism within the low right neck, including on image 47, is favored to be vascular or less likely muscular. No adenopathy in this area on today's study or the recent diagnostic CT.  CHEST  Circumferential right-sided pleural thickening with hypermetabolism. This is presumably secondary to mesothelioma  and/or recent VATS. Index area of anterior right pleural thickening and hypermetabolism measures a S.U.V. max of 3.8 on image 64/series 4.  Posterior right sided hypermetabolism, adjacent to loculated right pleural fluid, measures a S.U.V. max of 4.6.  ABDOMEN/PELVIS  No areas of abnormal hypermetabolism.  SKELETON  No abnormal marrow activity.  CT IMAGES PERFORMED FOR ATTENUATION CORRECTION  Carotid atherosclerosis, greater on the left.  Mild cardiomegaly with coronary artery atherosclerosis. Small volume loculated right-sided pleural fluid medially. Decreased pleural fluid  volume since 09/19/2013. Minimal right sided pleural air, including on image 94/series 4. Minimal left-sided pleural thickening also identified. Prominent prevascular nodes are not significantly changed and not hypermetabolic. Example 8 mm on image 63. Inferior right lung patchy atelectasis.  Renal lesions which are better evaluated on recent MRI. Moderate prostatomegaly. Fat containing left inguinal hernia.  IMPRESSION: 1. Right-sided pleural thickening and hypermetabolism with small volume loculated right-sided pleural effusion and small volume hydropneumothorax. The thickening and hypermetabolism are presumably related to mesothelioma. Partially could be secondary to recent VATS. 2. No evidence of thoracic nodal or extrathoracic disease.  Electronically Signed: By: Abigail Miyamoto M.D. On: 10/08/2013 16:37   Dg Chest Port 1 View  09/30/2013   CLINICAL DATA:  Post chest tube removal, evaluate for pneumothorax  EXAM: PORTABLE CHEST - 1 VIEW  COMPARISON:  Earlier same day; 09/29/2013; 09/28/2013; chest CT - 09/19/2013  FINDINGS: Grossly unchanged cardiac silhouette and mediastinal contours given persistently reduced lung volumes. Interval removal of right-sided chest tube without development of a pneumothorax. The amount of right lateral chest wall subcutaneous emphysema is grossly unchanged. Grossly unchanged bilateral infrahilar heterogeneous  opacities. No pleural effusion or pneumothorax. No evidence of edema. No acute osseus abnormalities.  IMPRESSION: 1. Interval removal of right-sided chest tube without development of a pneumothorax. 2. Persistently reduced lung volumes grossly unchanged bibasilar opacities, right greater than left, atelectasis versus infiltrate.   Electronically Signed   By: Sandi Mariscal M.D.   On: 09/30/2013 13:16   Dg Chest Port 1 View  09/30/2013   CLINICAL DATA:  Atelectasis/ effusion  EXAM: PORTABLE CHEST - 1 VIEW  COMPARISON:  Sep 29, 2013  FINDINGS: There is a chest tube on the right, unchanged in position. There is subcutaneous emphysema on the right, stable. No pneumothorax is appreciable on this study. There is bibasilar atelectatic change, more on the right than on the left. There is no appreciable effusion or consolidation. The heart size and pulmonary vascularity are normal.  IMPRESSION: Chest tube position on the right appears stable. There is subcutaneous emphysema on the right but no demonstrable pneumothorax. There is bibasilar atelectatic change, more on the right than on the left. No appreciable effusion or consolidation.   Electronically Signed   By: Lowella Grip M.D.   On: 09/30/2013 08:11   Dg Chest Port 1 View  09/29/2013   CLINICAL DATA:  Right-sided chest tube  EXAM: PORTABLE CHEST - 1 VIEW  COMPARISON:  09/28/2013  FINDINGS: Right-sided chest in unchanged position. No pneumothorax. Right basilar airspace opacity which may reflect atelectasis versus alveolar hemorrhage. Small right pleural effusion. Stable cardiomediastinal silhouette. Stable right chest wall subcutaneous emphysema. Unremarkable osseous structures.  IMPRESSION: 1. Right-sided chest tube in unchanged position. No pneumothorax. Right basilar airspace disease which may reflect atelectasis versus alveolar hemorrhage. Developing infiltrate not excluded.   Electronically Signed   By: Kathreen Devoid   On: 09/29/2013 08:37   Dg Chest Port 1  View  09/28/2013   CLINICAL DATA:  Status post VATS  EXAM: PORTABLE CHEST - 1 VIEW  COMPARISON:  09/27/2013  FINDINGS: Cardiac shadow is stable. Minimal left basilar atelectasis is again seen. Mild changes remain in the right lung base likely relating to a degree of atelectasis. Right-sided chest tube is again seen. No pneumothorax is noted. No sizable effusion is noted.  IMPRESSION: When compared with the prior exam, no significant interval change is noted.   Electronically Signed   By: Inez Catalina M.D.   On:  09/28/2013 07:43   Dg Chest Port 1 View  09/27/2013   CLINICAL DATA:  Chest tube  EXAM: PORTABLE CHEST - 1 VIEW  COMPARISON:  09/26/2013  FINDINGS: Cardiomediastinal silhouette is stable. Right chest tube is unchanged in position. Subcutaneous emphysema right chest wall is stable. No pneumothorax. Mild right basilar atelectasis. Left lung is clear.  IMPRESSION: Stable right chest tube position. No pneumothorax. Left lung is clear. Mild right basilar atelectasis.   Electronically Signed   By: Lahoma Crocker M.D.   On: 09/27/2013 08:11   Dg Chest Port 1 View  09/26/2013   CLINICAL DATA:  Recent thoracotomy with effusion  EXAM: PORTABLE CHEST - 1 VIEW  COMPARISON:  Sep 25, 2013  FINDINGS: Chest tube remains on the right. There is subcutaneous emphysema on the right, but no appreciable pneumothorax. There is persistent patchy atelectasis in the right lower lung zone with small right effusion. There is mild atelectasis in the left lower lobe. Elsewhere lungs are clear. Heart size and pulmonary vascular normal. No adenopathy. No bone lesions.  IMPRESSION: No change in chest tube position on the right. There is subcutaneous emphysema on the right but no pneumothorax apparent. There is some new mild atelectasis left base. Patchy atelectasis in the right base region with small right effusion remain stable. No change in cardiac silhouette.   Electronically Signed   By: Lowella Grip M.D.   On: 09/26/2013 08:17    Dg Chest Port 1 View  09/25/2013   CLINICAL DATA:  Post VATS.  EXAM: PORTABLE CHEST - 1 VIEW  COMPARISON:  09/24/2013 and 09/20/2013.  FINDINGS: 1824 hr. Right chest tube in is in place. There is soft tissue emphysema laterally in the right chest wall. No significant pneumothorax is seen. There is a small residual right pleural effusion with right basilar pulmonary opacity. There is also mild left basilar atelectasis. The heart size is unchanged.  IMPRESSION: Decreased size of right pleural effusion following VATS. No pneumothorax.   Electronically Signed   By: Camie Patience M.D.   On: 09/25/2013 19:36     ASSESSMENT/PLAN: The patient is a pleasant 80 year old Caucasian male recently diagnosed with metastatic pleural mesothelioma. Pathology revealed desmoplastic metastatic mesothelioma, Accession: [id number]. Patient was discussed with an also seen by Dr. Julien Nordmann. He is status post one cycle of systemic chemotherapy with cisplatin and Alimta. Overall he tolerated the first cycle of chemotherapy relatively well with the exception of some malaise, constipation and nausea. For pain management patient was given a prescription for Ultram 50 mg by mouth every 6 hours as needed for pain, total 30 tablets with no refill. Patient will be referred to [facility], Dr. Wynelle Cleveland for second opinion regarding possible surgical intervention for his case of metastatic mesothelioma. For constipation he will continue as Citrucel and add MiraLAX as needed. He will continue with weekly labs as scheduled. He'll followup in 2 weeks prior to the start of cycle #2 of his systemic chemotherapy with cisplatin and Alimta.     Wynetta Emery, ADRENA E, PA-C  All questions were answered. The patient knows to call the clinic with any problems, questions or concerns. We can certainly see the patient much sooner if necessary.  ADDENDUM: Hematology/Oncology Attending: I had a face to face encounter with the patient  today. I recommended his care plan. This is a very pleasant 80 years old white male recently diagnosed with desmoplastic malignant mesothelioma currently undergoing systemic chemotherapy with cisplatin and Alimta status post 1 cycle. He  tolerated the first cycle of his treatment fairly well except for one or 2 days of nausea and fatigue. His nausea results with the current nausea medication with Compazine. He denied having any other complaints. He was referred to Dr. Wynelle Cleveland for second to begin regarding surgical resection but the patient would like to hold on this referral until she completes at least 3 cycle of the systemic chemotherapy. I recommended for him to continue his treatment as scheduled and he is expected to start cycle #2 in 2 weeks. He would come back for followup visit at that time. He was advised to call immediately if he has any concerning symptoms in the interval.  Disclaimer: This note was dictated with voice recognition software. Similar sounding words can inadvertently be transcribed and may not be corrected upon review. Eilleen Kempf., MD 10/23/2013

## 2013-10-23 NOTE — Patient Instructions (Signed)
Take Ultram (tramadol) 50 mg by mouth every 6 hours as needed for pain Continue weekly labs as scheduled Followup in 2 weeks prior to the start of your next scheduled cycle of chemotherapy He been referred to [facility], Dr. Wynelle Cleveland for second opinion regarding possible surgical treatment of your disease.

## 2013-10-24 ENCOUNTER — Telehealth: Payer: Self-pay | Admitting: Internal Medicine

## 2013-10-24 NOTE — Telephone Encounter (Signed)
ADDED LB/FU 7/6 DUE TO NO AVAILABILITY 7/7. TX REMAINS 7/7. S/W PT HE IS AWARE AND WILL GET NEW SCHEDULE 6/30.

## 2013-10-28 ENCOUNTER — Telehealth: Payer: Self-pay | Admitting: *Deleted

## 2013-10-28 NOTE — Telephone Encounter (Signed)
Received call from [place] at Dr D'Amico's office at [facility].  Pt called and called his referral appointment that Dr Julien Nordmann made for patient to [facility].  Pt is due for f/u on 11/04/13 with Dr Vista Mink, will inform Dr Vista Mink.  SLJ

## 2013-10-29 ENCOUNTER — Other Ambulatory Visit (HOSPITAL_BASED_OUTPATIENT_CLINIC_OR_DEPARTMENT_OTHER): Payer: Medicare Other

## 2013-10-29 DIAGNOSIS — C384 Malignant neoplasm of pleura: Secondary | ICD-10-CM

## 2013-10-29 DIAGNOSIS — C450 Mesothelioma of pleura: Secondary | ICD-10-CM

## 2013-10-29 LAB — CBC WITH DIFFERENTIAL/PLATELET
BASO%: 0.4 % (ref 0.0–2.0)
BASOS ABS: 0 10*3/uL (ref 0.0–0.1)
EOS%: 1.5 % (ref 0.0–7.0)
Eosinophils Absolute: 0.1 10*3/uL (ref 0.0–0.5)
HEMATOCRIT: 39 % (ref 38.4–49.9)
HGB: 12.7 g/dL — ABNORMAL LOW (ref 13.0–17.1)
LYMPH#: 1.3 10*3/uL (ref 0.9–3.3)
LYMPH%: 34.5 % (ref 14.0–49.0)
MCH: 29.5 pg (ref 27.2–33.4)
MCHC: 32.7 g/dL (ref 32.0–36.0)
MCV: 90.1 fL (ref 79.3–98.0)
MONO#: 0.4 10*3/uL (ref 0.1–0.9)
MONO%: 10.2 % (ref 0.0–14.0)
NEUT#: 2.1 10*3/uL (ref 1.5–6.5)
NEUT%: 53.4 % (ref 39.0–75.0)
Platelets: 184 10*3/uL (ref 140–400)
RBC: 4.33 10*6/uL (ref 4.20–5.82)
RDW: 13.9 % (ref 11.0–14.6)
WBC: 3.9 10*3/uL — AB (ref 4.0–10.3)

## 2013-10-29 LAB — COMPREHENSIVE METABOLIC PANEL (CC13)
ALT: 10 U/L (ref 0–55)
AST: 15 U/L (ref 5–34)
Albumin: 2.7 g/dL — ABNORMAL LOW (ref 3.5–5.0)
Alkaline Phosphatase: 78 U/L (ref 40–150)
Anion Gap: 7 mEq/L (ref 3–11)
BUN: 17.3 mg/dL (ref 7.0–26.0)
CALCIUM: 9.1 mg/dL (ref 8.4–10.4)
CO2: 27 mEq/L (ref 22–29)
Chloride: 104 mEq/L (ref 98–109)
Creatinine: 1.2 mg/dL (ref 0.7–1.3)
Glucose: 109 mg/dl (ref 70–140)
Potassium: 4.3 mEq/L (ref 3.5–5.1)
Sodium: 138 mEq/L (ref 136–145)
TOTAL PROTEIN: 6.3 g/dL — AB (ref 6.4–8.3)
Total Bilirubin: 0.37 mg/dL (ref 0.20–1.20)

## 2013-10-29 LAB — MAGNESIUM (CC13): Magnesium: 1.9 mg/dl (ref 1.5–2.5)

## 2013-11-03 LAB — AFB CULTURE WITH SMEAR (NOT AT ARMC): Acid Fast Smear: NONE SEEN

## 2013-11-04 ENCOUNTER — Encounter: Payer: Self-pay | Admitting: Internal Medicine

## 2013-11-04 ENCOUNTER — Telehealth: Payer: Self-pay | Admitting: Internal Medicine

## 2013-11-04 ENCOUNTER — Ambulatory Visit (HOSPITAL_BASED_OUTPATIENT_CLINIC_OR_DEPARTMENT_OTHER): Payer: Medicare Other | Admitting: Internal Medicine

## 2013-11-04 ENCOUNTER — Other Ambulatory Visit (HOSPITAL_BASED_OUTPATIENT_CLINIC_OR_DEPARTMENT_OTHER): Payer: Medicare Other

## 2013-11-04 VITALS — BP 128/72 | HR 69 | Temp 97.6°F | Resp 18 | Ht 68.0 in | Wt 156.7 lb

## 2013-11-04 DIAGNOSIS — C450 Mesothelioma of pleura: Secondary | ICD-10-CM

## 2013-11-04 DIAGNOSIS — K3189 Other diseases of stomach and duodenum: Secondary | ICD-10-CM

## 2013-11-04 DIAGNOSIS — C384 Malignant neoplasm of pleura: Secondary | ICD-10-CM

## 2013-11-04 DIAGNOSIS — R1013 Epigastric pain: Secondary | ICD-10-CM

## 2013-11-04 LAB — COMPREHENSIVE METABOLIC PANEL (CC13)
ALBUMIN: 3 g/dL — AB (ref 3.5–5.0)
ALT: 13 U/L (ref 0–55)
ANION GAP: 8 meq/L (ref 3–11)
AST: 17 U/L (ref 5–34)
Alkaline Phosphatase: 85 U/L (ref 40–150)
BILIRUBIN TOTAL: 0.26 mg/dL (ref 0.20–1.20)
BUN: 14.1 mg/dL (ref 7.0–26.0)
CALCIUM: 9.6 mg/dL (ref 8.4–10.4)
CO2: 27 mEq/L (ref 22–29)
Chloride: 102 mEq/L (ref 98–109)
Creatinine: 1.2 mg/dL (ref 0.7–1.3)
Glucose: 156 mg/dl — ABNORMAL HIGH (ref 70–140)
Potassium: 4.6 mEq/L (ref 3.5–5.1)
SODIUM: 137 meq/L (ref 136–145)
Total Protein: 7 g/dL (ref 6.4–8.3)

## 2013-11-04 LAB — CBC WITH DIFFERENTIAL/PLATELET
BASO%: 0.3 % (ref 0.0–2.0)
Basophils Absolute: 0 10*3/uL (ref 0.0–0.1)
EOS%: 8.9 % — ABNORMAL HIGH (ref 0.0–7.0)
Eosinophils Absolute: 0.2 10*3/uL (ref 0.0–0.5)
HCT: 41.5 % (ref 38.4–49.9)
HGB: 13.6 g/dL (ref 13.0–17.1)
LYMPH%: 41.8 % (ref 14.0–49.0)
MCH: 29.2 pg (ref 27.2–33.4)
MCHC: 32.8 g/dL (ref 32.0–36.0)
MCV: 89 fL (ref 79.3–98.0)
MONO#: 0.2 10*3/uL (ref 0.1–0.9)
MONO%: 6.7 % (ref 0.0–14.0)
NEUT#: 1.1 10*3/uL — ABNORMAL LOW (ref 1.5–6.5)
NEUT%: 42.3 % (ref 39.0–75.0)
PLATELETS: 380 10*3/uL (ref 140–400)
RBC: 4.66 10*6/uL (ref 4.20–5.82)
RDW: 13.9 % (ref 11.0–14.6)
WBC: 2.5 10*3/uL — ABNORMAL LOW (ref 4.0–10.3)
lymph#: 1.1 10*3/uL (ref 0.9–3.3)

## 2013-11-04 LAB — MAGNESIUM (CC13): Magnesium: 2 mg/dl (ref 1.5–2.5)

## 2013-11-04 NOTE — Progress Notes (Signed)
[facility] [phone]   [phone]  OFFICE PROGRESS NOTE  [name], MD [place] [address]  DIAGNOSIS: Malignant pleural mesothelioma  Primary site: Pleural Mesothelioma (Right)  Staging method: AJCC 7th Edition  Clinical: Stage II (T2, N0, M0) signed by Curt Bears, MD on 10/09/2013 12:58 PM  Summary: Stage II (T2, N0, M0)   PRIOR THERAPY: Status post right fax with drainage of right pleural effusion and pleural biopsies on 09/25/2013 the care of Dr. Roxan Hockey   CURRENT THERAPY: Systemic chemotherapy with cisplatin 75 mg/m2 and Alimta 500 mg/m2 given every 3 weeks. Status post 1 cycle  INTERVAL HISTORY: Sean Mitchell. 80 y.o. male returns to the clinic today for followup visit accompanied by his wife. The patient is feeling fine today with no specific complaints except for occasional upset stomach but no significant nausea or vomiting. He denied having any significant weight loss or night sweats. He has no fever or chills. He denied having any significant chest pain but continues to have shortness breath with exertion was no cough or hemoptysis. He tolerated the first cycle of his treatment fairly well with no significant adverse effects except for mild nausea a few days after the treatment. He is expected to start cycle #2 his chemotherapy tomorrow.  MEDICAL HISTORY: Past Medical History  Diagnosis Date  . Personal history of contact with and (suspected) exposure to asbestos   . Unspecified essential hypertension   . Other and unspecified hyperlipidemia   . Esophageal reflux   . Diverticulosis of colon (without mention of hemorrhage)   . Elevated prostate specific antigen (PSA)   . Osteoarthrosis, unspecified whether generalized or localized, unspecified site   . Anxiety state, unspecified   . Rectal mass   . Shortness of breath     due to fluid    ALLERGIES:  is allergic to  other.  MEDICATIONS:  Current Outpatient Prescriptions  Medication Sig Dispense Refill  . aspirin 81 MG tablet Take 81 mg by mouth daily.        Marland Kitchen atenolol (TENORMIN) 25 MG tablet Take 25 mg by mouth daily.      Marland Kitchen dexamethasone (DECADRON) 4 MG tablet 4 milligrams by mouth twice a day the day before, day of and day after the chemotherapy every 3 weeks  40 tablet  1  . EPINEPHrine (EPIPEN 2-PAK) 0.3 mg/0.3 mL DEVI Inject 0.3 mLs (0.3 mg total) into the muscle once.  1 Device  1  . esomeprazole (NEXIUM) 20 MG capsule Take 20 mg by mouth daily at 12 noon.      . finasteride (PROSCAR) 5 MG tablet Take 1 tablet (5 mg total) by mouth daily.  100 tablet  3  . fish oil-omega-3 fatty acids 1000 MG capsule Take 2 g by mouth 2 (two) times daily.       . folic acid (FOLVITE) 1 MG tablet Take 1 tablet (1 mg total) by mouth daily.  30 tablet  4  . glucosamine-chondroitin 500-400 MG tablet Take 1 tablet by mouth 2 (two) times daily.       Marland Kitchen HYDROcodone-homatropine (HYCODAN) 5-1.5 MG/5ML syrup Take 5 mLs by mouth every 8 (eight) hours as needed for cough.  240 mL  0  . multivitamin (THERAGRAN) per tablet Take 1 tablet by mouth daily.        Marland Kitchen oxyCODONE (OXY IR/ROXICODONE) 5 MG immediate release tablet Take 1-2 tablets (5-10 mg total) by mouth  every 6 (six) hours as needed for severe pain.  30 tablet  0  . pravastatin (PRAVACHOL) 40 MG tablet Take 1 tablet (40 mg total) by mouth every other day.  100 tablet  3  . prochlorperazine (COMPAZINE) 10 MG tablet Take 1 tablet (10 mg total) by mouth every 6 (six) hours as needed for nausea or vomiting.  60 tablet  0  . terazosin (HYTRIN) 10 MG capsule Take 1 capsule (10 mg total) by mouth at bedtime.  100 capsule  3  . traMADol (ULTRAM) 50 MG tablet Take 1 tablet (50 mg total) by mouth every 6 (six) hours as needed.  30 tablet  0   No current facility-administered medications for this visit.    SURGICAL HISTORY:  Past Surgical History  Procedure Laterality Date  .  Inguinal hernia repair  1989    left  . Colonoscopy w/ polypectomy    . Video assisted thoracoscopy (vats)/decortication Right 09/25/2013    Procedure: VIDEO ASSISTED THORACOSCOPY (VATS)/POSSIBLE DECORTICATION;  Surgeon: Melrose Nakayama, MD;  Location: [facility];  Service: Thoracic;  Laterality: Right;  . Pleural effusion drainage Right 09/25/2013    Procedure: DRAINAGE OF PLEURAL EFFUSION;  Surgeon: Melrose Nakayama, MD;  Location: [facility];  Service: Thoracic;  Laterality: Right;    REVIEW OF SYSTEMS:  Constitutional: positive for fatigue Eyes: negative Ears, nose, mouth, throat, and face: negative Respiratory: negative Cardiovascular: negative Gastrointestinal: negative Genitourinary:negative Integument/breast: negative Hematologic/lymphatic: negative Musculoskeletal:negative Neurological: negative Behavioral/Psych: negative Endocrine: negative Allergic/Immunologic: negative   PHYSICAL EXAMINATION: General appearance: alert, cooperative, fatigued and no distress Head: Normocephalic, without obvious abnormality, atraumatic Neck: no adenopathy, no JVD, supple, symmetrical, trachea midline and thyroid not enlarged, symmetric, no tenderness/mass/nodules Lymph nodes: Cervical, supraclavicular, and axillary nodes normal. Resp: clear to auscultation bilaterally Back: symmetric, no curvature. ROM normal. No CVA tenderness. Cardio: regular rate and rhythm, S1, S2 normal, no murmur, click, rub or gallop GI: soft, non-tender; bowel sounds normal; no masses,  no organomegaly Extremities: extremities normal, atraumatic, no cyanosis or edema Neurologic: Alert and oriented X 3, normal strength and tone. Normal symmetric reflexes. Normal coordination and gait  ECOG PERFORMANCE STATUS: 1 - Symptomatic but completely ambulatory  Blood pressure 128/72, pulse 69, temperature 97.6 F (36.4 C), temperature source Oral, resp. rate 18, height 5\' 8"  (1.727 m), weight 156 lb 11.2 oz (71.079 kg), SpO2  98.00%.  LABORATORY DATA: Lab Results  Component Value Date   WBC 2.5* 11/04/2013   HGB 13.6 11/04/2013   HCT 41.5 11/04/2013   MCV 89.0 11/04/2013   PLT 380 11/04/2013      Chemistry      Component Value Date/Time   NA 137 11/04/2013 1441   NA 135* 09/27/2013 0418   K 4.6 11/04/2013 1441   K 4.6 09/27/2013 0418   CL 102 09/27/2013 0418   CO2 27 11/04/2013 1441   CO2 23 09/27/2013 0418   BUN 14.1 11/04/2013 1441   BUN 14 09/27/2013 0418   CREATININE 1.2 11/04/2013 1441   CREATININE 1.11 09/27/2013 0418      Component Value Date/Time   CALCIUM 9.6 11/04/2013 1441   CALCIUM 8.3* 09/27/2013 0418   ALKPHOS 85 11/04/2013 1441   ALKPHOS 61 09/27/2013 0418   AST 17 11/04/2013 1441   AST 17 09/27/2013 0418   ALT 13 11/04/2013 1441   ALT 14 09/27/2013 0418   BILITOT 0.26 11/04/2013 1441   BILITOT 0.3 09/27/2013 0418       RADIOGRAPHIC STUDIES:  ASSESSMENT  AND PLAN: This is a very pleasant 80 years old white male recently diagnosed with malignant pleural mesothelioma involving the right lung and currently undergoing systemic chemotherapy with cisplatin and Alimta status post 1 cycle. He tolerated the first cycle fairly well except for few episodes of nausea a few days after the chemotherapy. This is completely resolved but the patient continues to have occasional upset stomach with no nausea or vomiting. His absolute neutrophil count is low today at 1100. I gave the patient the option of getting the next cycle of his chemotherapy by one week until he has complete recovery of his absolute neutrophil count versus proceeding with treatment tomorrow as scheduled but to give him Neulasta injection after the treatment. After lengthy discussion the patient and his wife decided to proceed with the treatment as planned and he would receive Neulasta injection the day after his treatment. He would come back for followup visit in 3 weeks with the next cycle of his chemotherapy. He was advised to call me immediately if he has any  concerning symptoms in the interval. The patient voices understanding of current disease status and treatment options and is in agreement with the current care plan.  All questions were answered. The patient knows to call the clinic with any problems, questions or concerns. We can certainly see the patient much sooner if necessary.  I spent 15 minutes counseling the patient face to face. The total time spent in the appointment was 25 minutes.  Disclaimer: This note was dictated with voice recognition software. Similar sounding words can inadvertently be transcribed and may not be corrected upon review.

## 2013-11-04 NOTE — Telephone Encounter (Signed)
gv and printed appt sched and avs for ptf or July adn Aug...sed added tx.

## 2013-11-05 ENCOUNTER — Other Ambulatory Visit: Payer: Medicare Other

## 2013-11-05 ENCOUNTER — Ambulatory Visit (HOSPITAL_BASED_OUTPATIENT_CLINIC_OR_DEPARTMENT_OTHER): Payer: Medicare Other

## 2013-11-05 VITALS — BP 134/69 | HR 68 | Temp 97.5°F

## 2013-11-05 DIAGNOSIS — C384 Malignant neoplasm of pleura: Secondary | ICD-10-CM

## 2013-11-05 DIAGNOSIS — Z5111 Encounter for antineoplastic chemotherapy: Secondary | ICD-10-CM

## 2013-11-05 DIAGNOSIS — C450 Mesothelioma of pleura: Secondary | ICD-10-CM

## 2013-11-05 MED ORDER — SODIUM CHLORIDE 0.9 % IV SOLN
150.0000 mg | Freq: Once | INTRAVENOUS | Status: AC
  Administered 2013-11-05: 150 mg via INTRAVENOUS
  Filled 2013-11-05: qty 5

## 2013-11-05 MED ORDER — SODIUM CHLORIDE 0.9 % IV SOLN
75.0000 mg/m2 | Freq: Once | INTRAVENOUS | Status: AC
  Administered 2013-11-05: 143 mg via INTRAVENOUS
  Filled 2013-11-05: qty 143

## 2013-11-05 MED ORDER — SODIUM CHLORIDE 0.9 % IV SOLN
500.0000 mg/m2 | Freq: Once | INTRAVENOUS | Status: AC
  Administered 2013-11-05: 950 mg via INTRAVENOUS
  Filled 2013-11-05: qty 38

## 2013-11-05 MED ORDER — ACETAMINOPHEN 500 MG PO TABS
500.0000 mg | ORAL_TABLET | Freq: Once | ORAL | Status: AC
  Administered 2013-11-05: 500 mg via ORAL

## 2013-11-05 MED ORDER — POTASSIUM CHLORIDE 2 MEQ/ML IV SOLN
Freq: Once | INTRAVENOUS | Status: AC
  Administered 2013-11-05: 12:00:00 via INTRAVENOUS
  Filled 2013-11-05: qty 10

## 2013-11-05 MED ORDER — PALONOSETRON HCL INJECTION 0.25 MG/5ML
0.2500 mg | Freq: Once | INTRAVENOUS | Status: AC
  Administered 2013-11-05: 0.25 mg via INTRAVENOUS

## 2013-11-05 MED ORDER — PALONOSETRON HCL INJECTION 0.25 MG/5ML
INTRAVENOUS | Status: AC
  Filled 2013-11-05: qty 5

## 2013-11-05 MED ORDER — DEXAMETHASONE SODIUM PHOSPHATE 20 MG/5ML IJ SOLN
INTRAMUSCULAR | Status: AC
  Filled 2013-11-05: qty 5

## 2013-11-05 MED ORDER — ACETAMINOPHEN 500 MG PO TABS
ORAL_TABLET | ORAL | Status: AC
  Filled 2013-11-05: qty 1

## 2013-11-05 MED ORDER — DEXAMETHASONE SODIUM PHOSPHATE 20 MG/5ML IJ SOLN
12.0000 mg | Freq: Once | INTRAMUSCULAR | Status: AC
Start: 2013-11-05 — End: 2013-11-05
  Administered 2013-11-05: 12 mg via INTRAVENOUS

## 2013-11-05 NOTE — Patient Instructions (Signed)
[facility] Discharge Instructions for Patients Receiving Chemotherapy  Today you received the following chemotherapy agents alimta/cisplatin.    To help prevent nausea and vomiting after your treatment, we encourage you to take your nausea medication as directed.     If you develop nausea and vomiting that is not controlled by your nausea medication, call the clinic.   BELOW ARE SYMPTOMS THAT SHOULD BE REPORTED IMMEDIATELY:  *FEVER GREATER THAN 100.5 F  *CHILLS WITH OR WITHOUT FEVER  NAUSEA AND VOMITING THAT IS NOT CONTROLLED WITH YOUR NAUSEA MEDICATION  *UNUSUAL SHORTNESS OF BREATH  *UNUSUAL BRUISING OR BLEEDING  TENDERNESS IN MOUTH AND THROAT WITH OR WITHOUT PRESENCE OF ULCERS  *URINARY PROBLEMS  *BOWEL PROBLEMS  UNUSUAL RASH Items with * indicate a potential emergency and should be followed up as soon as possible.  Feel free to call the clinic you have any questions or concerns. The clinic phone number is [phone].

## 2013-11-05 NOTE — Progress Notes (Signed)
Per Dr Julien Nordmann office note, ok to treat with neutrophils 1.1, pt to receive neulasta injection tomorrow.

## 2013-11-06 ENCOUNTER — Ambulatory Visit (HOSPITAL_BASED_OUTPATIENT_CLINIC_OR_DEPARTMENT_OTHER): Payer: Medicare Other

## 2013-11-06 ENCOUNTER — Other Ambulatory Visit: Payer: Self-pay | Admitting: *Deleted

## 2013-11-06 VITALS — BP 118/55 | HR 58 | Temp 97.5°F

## 2013-11-06 DIAGNOSIS — C450 Mesothelioma of pleura: Secondary | ICD-10-CM

## 2013-11-06 DIAGNOSIS — C384 Malignant neoplasm of pleura: Secondary | ICD-10-CM

## 2013-11-06 DIAGNOSIS — Z5189 Encounter for other specified aftercare: Secondary | ICD-10-CM

## 2013-11-06 MED ORDER — ONDANSETRON HCL 8 MG PO TABS: 8.0000 mg | ORAL_TABLET | Freq: Three times a day (TID) | ORAL | Status: DC | PRN

## 2013-11-06 MED ORDER — PEGFILGRASTIM INJECTION 6 MG/0.6ML
6.0000 mg | Freq: Once | SUBCUTANEOUS | Status: AC
  Administered 2013-11-06: 6 mg via SUBCUTANEOUS
  Filled 2013-11-06: qty 0.6

## 2013-11-06 NOTE — Patient Instructions (Signed)

## 2013-11-07 ENCOUNTER — Ambulatory Visit: Payer: Medicare Other | Admitting: Cardiothoracic Surgery

## 2013-11-08 ENCOUNTER — Other Ambulatory Visit: Payer: Self-pay | Admitting: Family Medicine

## 2013-11-12 ENCOUNTER — Emergency Department (HOSPITAL_COMMUNITY)
Admission: EM | Admit: 2013-11-12 | Discharge: 2013-11-12 | Disposition: A | Discharge status: Home / Self Care | Payer: Medicare Other | Attending: Emergency Medicine | Admitting: Emergency Medicine

## 2013-11-12 ENCOUNTER — Other Ambulatory Visit (HOSPITAL_BASED_OUTPATIENT_CLINIC_OR_DEPARTMENT_OTHER): Payer: Medicare Other

## 2013-11-12 ENCOUNTER — Encounter (HOSPITAL_COMMUNITY): Payer: Self-pay | Admitting: Emergency Medicine

## 2013-11-12 DIAGNOSIS — Z79899 Other long term (current) drug therapy: Secondary | ICD-10-CM | POA: Insufficient documentation

## 2013-11-12 DIAGNOSIS — F411 Generalized anxiety disorder: Secondary | ICD-10-CM | POA: Diagnosis not present

## 2013-11-12 DIAGNOSIS — M1990 Unspecified osteoarthritis, unspecified site: Secondary | ICD-10-CM | POA: Diagnosis not present

## 2013-11-12 DIAGNOSIS — L02419 Cutaneous abscess of limb, unspecified: Secondary | ICD-10-CM | POA: Diagnosis present

## 2013-11-12 DIAGNOSIS — I10 Essential (primary) hypertension: Secondary | ICD-10-CM | POA: Insufficient documentation

## 2013-11-12 DIAGNOSIS — L089 Local infection of the skin and subcutaneous tissue, unspecified: Secondary | ICD-10-CM | POA: Diagnosis not present

## 2013-11-12 DIAGNOSIS — C450 Mesothelioma of pleura: Secondary | ICD-10-CM

## 2013-11-12 DIAGNOSIS — E785 Hyperlipidemia, unspecified: Secondary | ICD-10-CM | POA: Insufficient documentation

## 2013-11-12 DIAGNOSIS — K219 Gastro-esophageal reflux disease without esophagitis: Secondary | ICD-10-CM | POA: Diagnosis not present

## 2013-11-12 DIAGNOSIS — Z77090 Contact with and (suspected) exposure to asbestos: Secondary | ICD-10-CM | POA: Diagnosis not present

## 2013-11-12 DIAGNOSIS — C384 Malignant neoplasm of pleura: Secondary | ICD-10-CM

## 2013-11-12 DIAGNOSIS — Z7982 Long term (current) use of aspirin: Secondary | ICD-10-CM | POA: Diagnosis not present

## 2013-11-12 DIAGNOSIS — Z792 Long term (current) use of antibiotics: Secondary | ICD-10-CM | POA: Insufficient documentation

## 2013-11-12 LAB — COMPREHENSIVE METABOLIC PANEL (CC13)
ALBUMIN: 2.9 g/dL — AB (ref 3.5–5.0)
ALK PHOS: 73 U/L (ref 40–150)
ALT: 8 U/L (ref 0–55)
AST: 15 U/L (ref 5–34)
Anion Gap: 7 mEq/L (ref 3–11)
BUN: 24.7 mg/dL (ref 7.0–26.0)
CHLORIDE: 103 meq/L (ref 98–109)
CO2: 28 mEq/L (ref 22–29)
CREATININE: 1.1 mg/dL (ref 0.7–1.3)
Calcium: 9 mg/dL (ref 8.4–10.4)
Glucose: 93 mg/dl (ref 70–140)
POTASSIUM: 3.9 meq/L (ref 3.5–5.1)
Sodium: 138 mEq/L (ref 136–145)
Total Bilirubin: 0.51 mg/dL (ref 0.20–1.20)
Total Protein: 6.1 g/dL — ABNORMAL LOW (ref 6.4–8.3)

## 2013-11-12 LAB — CBC WITH DIFFERENTIAL/PLATELET
BASO%: 0.4 % (ref 0.0–2.0)
BASOS ABS: 0 10*3/uL (ref 0.0–0.1)
EOS%: 2.1 % (ref 0.0–7.0)
Eosinophils Absolute: 0.1 10*3/uL (ref 0.0–0.5)
HCT: 37.3 % — ABNORMAL LOW (ref 38.4–49.9)
HGB: 12.3 g/dL — ABNORMAL LOW (ref 13.0–17.1)
LYMPH#: 1.2 10*3/uL (ref 0.9–3.3)
LYMPH%: 52.6 % — ABNORMAL HIGH (ref 14.0–49.0)
MCH: 28.7 pg (ref 27.2–33.4)
MCHC: 33 g/dL (ref 32.0–36.0)
MCV: 87.1 fL (ref 79.3–98.0)
MONO#: 0.2 10*3/uL (ref 0.1–0.9)
MONO%: 6.4 % (ref 0.0–14.0)
NEUT#: 0.9 10*3/uL — ABNORMAL LOW (ref 1.5–6.5)
NEUT%: 38.5 % — ABNORMAL LOW (ref 39.0–75.0)
Platelets: 69 10*3/uL — ABNORMAL LOW (ref 140–400)
RBC: 4.28 10*6/uL (ref 4.20–5.82)
RDW: 14.2 % (ref 11.0–14.6)
WBC: 2.3 10*3/uL — ABNORMAL LOW (ref 4.0–10.3)

## 2013-11-12 LAB — MAGNESIUM (CC13): Magnesium: 1.9 mg/dl (ref 1.5–2.5)

## 2013-11-12 MED ORDER — DOXYCYCLINE HYCLATE 100 MG PO CAPS: 100.0000 mg | ORAL_CAPSULE | Freq: Two times a day (BID) | ORAL | Status: DC

## 2013-11-12 NOTE — ED Provider Notes (Signed)
CSN: [id number]     Arrival date & time 11/12/13  2033 History   First MD Initiated Contact with Patient 11/12/13 2059     Chief Complaint  Patient presents with  . Abscess     (Consider location/radiation/quality/duration/timing/severity/associated sxs/prior Treatment) Patient is a 80 y.o. male presenting with abscess. The history is provided by the patient and the spouse.  Abscess  He presents for evaluation of possible abscess, right lower leg. He noticed a nodule, just below the right knee, one week ago. Today, the area began to be red. He does not have pain in the area and has no limitation with walking. He is concerned that his white blood cell count may be low because of recent chemotherapy. He had a Neulasta injection, one week ago. He denies shortness of breath, chest pain, fever, chills, nausea, vomiting, weakness, or dizziness. No known trauma. No other known modifying factors  Past Medical History  Diagnosis Date  . Personal history of contact with and (suspected) exposure to asbestos   . Unspecified essential hypertension   . Other and unspecified hyperlipidemia   . Esophageal reflux   . Diverticulosis of colon (without mention of hemorrhage)   . Elevated prostate specific antigen (PSA)   . Osteoarthrosis, unspecified whether generalized or localized, unspecified site   . Anxiety state, unspecified   . Rectal mass   . Shortness of breath     due to fluid   Past Surgical History  Procedure Laterality Date  . Inguinal hernia repair  1989    left  . Colonoscopy w/ polypectomy    . Video assisted thoracoscopy (vats)/decortication Right 09/25/2013    Procedure: VIDEO ASSISTED THORACOSCOPY (VATS)/POSSIBLE DECORTICATION;  Surgeon: Melrose Nakayama, MD;  Location: [facility] OR;  Service: Thoracic;  Laterality: Right;  . Pleural effusion drainage Right 09/25/2013    Procedure: DRAINAGE OF PLEURAL EFFUSION;  Surgeon: Melrose Nakayama, MD;  Location: [facility];  Service: Thoracic;   Laterality: Right;   Family History  Problem Relation Age of Onset  . Coronary artery disease Father   . Coronary artery disease Mother   . Heart attack Brother   . Colon cancer Father     ? not exactly sure   History  Substance Use Topics  . Smoking status: Never Smoker   . Smokeless tobacco: Not on file  . Alcohol Use: No    Review of Systems  All other systems reviewed and are negative.     Allergies  Other  Home Medications   Prior to Admission medications   Medication Sig Start Date End Date Taking? Authorizing Provider  aspirin 81 MG tablet Take 81 mg by mouth daily.     Yes Historical Provider, MD  atenolol (TENORMIN) 25 MG tablet Take 25 mg by mouth daily.   Yes Historical Provider, MD  dexamethasone (DECADRON) 4 MG tablet Take 4 mg by mouth 2 (two) times daily with a meal. 1 tablet twice daily the day before, the day of, and the day after chemo   Yes Historical Provider, MD  EPINEPHrine (EPIPEN 2-PAK) 0.3 mg/0.3 mL DEVI Inject 0.3 mLs (0.3 mg total) into the muscle once. 10/10/12  Yes Dorena Cookey, MD  esomeprazole (NEXIUM) 20 MG capsule Take 20 mg by mouth daily at 12 noon.   Yes Historical Provider, MD  finasteride (PROSCAR) 5 MG tablet Take 5 mg by mouth as directed. Monday, Wednesday, Friday and Sunday   Yes Historical Provider, MD  folic acid (FOLVITE) 1 MG  tablet Take 1 tablet (1 mg total) by mouth daily. 10/09/13  Yes Curt Bears, MD  HYDROcodone-homatropine [facility]) 5-1.5 MG/5ML syrup Take 5 mLs by mouth every 8 (eight) hours as needed for cough. 09/02/13  Yes Dorena Cookey, MD  ondansetron (ZOFRAN) 8 MG tablet Take 1 tablet (8 mg total) by mouth every 8 (eight) hours as needed for nausea or vomiting. 11/06/13  Yes Curt Bears, MD  pravastatin (PRAVACHOL) 40 MG tablet Take 40 mg by mouth as directed. Monday, Wednesday, Friday and Sunday   Yes Historical Provider, MD  prochlorperazine (COMPAZINE) 10 MG tablet Take 1 tablet (10 mg total) by mouth every 6  (six) hours as needed for nausea or vomiting. 10/09/13  Yes Curt Bears, MD  terazosin (HYTRIN) 10 MG capsule Take 10 mg by mouth every evening.   Yes Historical Provider, MD  traMADol (ULTRAM) 50 MG tablet Take 1 tablet (50 mg total) by mouth every 6 (six) hours as needed. 10/22/13  Yes Adrena E Johnson, PA-C  doxycycline (VIBRAMYCIN) 100 MG capsule Take 1 capsule (100 mg total) by mouth 2 (two) times daily. 11/12/13   Richarda Blade, MD   BP 129/69  Pulse 68  Temp(Src) 98 F (36.7 C) (Oral)  Resp 17  Ht 5\' 8"  (1.727 m)  Wt 150 lb (68.04 kg)  BMI 22.81 kg/m2  SpO2 97% Physical Exam  Nursing note and vitals reviewed. Constitutional: He is oriented to person, place, and time. He appears well-developed and well-nourished.  HENT:  Head: Normocephalic and atraumatic.  Right Ear: External ear normal.  Left Ear: External ear normal.  Eyes: Conjunctivae and EOM are normal. Pupils are equal, round, and reactive to light.  Neck: Normal range of motion and phonation normal. Neck supple.  Cardiovascular: Normal rate.   Pulmonary/Chest: Effort normal. He exhibits no bony tenderness.  Musculoskeletal: Normal range of motion. He exhibits no edema.  There is no right lower extremity edema, or popliteal tenderness or calf tenderness.  Neurological: He is alert and oriented to person, place, and time. No cranial nerve deficit or sensory deficit. He exhibits normal muscle tone. Coordination normal.  Skin: Skin is warm, dry and intact.  Small nodule just below the right knee, medially, 1 x 1.5 cm. The nodule is minimally tender. There is no associated fluctuance. Overlying the nodule is vague erythema, without swelling. In the medial popliteal space of the right knee,  there is indistinct redness in a semicircular pattern. It is not clear if this is a secondary process or associated with the nodule.  Psychiatric: He has a normal mood and affect. His behavior is normal. Judgment and thought content  normal.    ED Course  Procedures (including critical care time)   Findings and treatment plan discussed with patient and wife. All questions answered.  MDM   Final diagnoses:  Skin infection    Nonspecific nodule right lower leg with associated redness. There is no evidence for cellulitis, DVT, septic arthritis or systemic illness. His white blood cell count today, was 2.3, with an [place] of 900. I do not believe he is at risk for, worsening infection.  Nursing Notes Reviewed/ Care Coordinated Applicable Imaging Reviewed Interpretation of Laboratory Data incorporated into ED treatment  The patient appears reasonably screened and/or stabilized for discharge and I doubt any other medical condition or other [facility] requiring further screening, evaluation, or treatment in the ED at this time prior to discharge.  Plan: Home Medications- Doxycycline; Home Treatments- Heat, Elevation; return here  if the recommended treatment, does not improve the symptoms; Recommended follow up- PCP f/u in 2 days    Richarda Blade, MD 11/12/13 2153

## 2013-11-12 NOTE — ED Notes (Signed)
Pt states he noticed on red place in inner lower leg this am,  Pt states feels like a sting,  Pt is currently on chemo for lung cancer,  States no more sob than usual,  Unable to quantify pain level but states he knows it is there,

## 2013-11-12 NOTE — Discharge Instructions (Signed)
Use warm compress on the sore area of the right leg 3 or 4 times a day for 30 minutes. Elevate the right leg above your heart as much as possible.

## 2013-11-12 NOTE — ED Notes (Signed)
Pt states that he has had soreness to his left lower inner leg x 1 week; pt states that it was red this morning and has began to swell this evening; pt was advised by CA MD to come and have it evaluated

## 2013-11-14 ENCOUNTER — Ambulatory Visit (INDEPENDENT_AMBULATORY_CARE_PROVIDER_SITE_OTHER): Payer: Medicare Other | Admitting: Family Medicine

## 2013-11-14 ENCOUNTER — Encounter: Payer: Self-pay | Admitting: Family Medicine

## 2013-11-14 VITALS — BP 120/70 | Temp 98.7°F | Wt 153.0 lb

## 2013-11-14 DIAGNOSIS — R972 Elevated prostate specific antigen [PSA]: Secondary | ICD-10-CM

## 2013-11-14 DIAGNOSIS — I809 Phlebitis and thrombophlebitis of unspecified site: Secondary | ICD-10-CM

## 2013-11-14 DIAGNOSIS — E785 Hyperlipidemia, unspecified: Secondary | ICD-10-CM

## 2013-11-14 DIAGNOSIS — I10 Essential (primary) hypertension: Secondary | ICD-10-CM

## 2013-11-14 MED ORDER — ATENOLOL 25 MG PO TABS: 25.0000 mg | ORAL_TABLET | Freq: Every day | ORAL | Status: AC

## 2013-11-14 MED ORDER — TERAZOSIN HCL 10 MG PO CAPS: 10.0000 mg | ORAL_CAPSULE | Freq: Every evening | ORAL | Status: AC

## 2013-11-14 MED ORDER — FINASTERIDE 5 MG PO TABS: 5.0000 mg | ORAL_TABLET | ORAL | Status: AC

## 2013-11-14 NOTE — Progress Notes (Signed)
Pre visit review using our clinic review tool, if applicable. No additional management support is needed unless otherwise documented below in the visit note. 

## 2013-11-14 NOTE — Patient Instructions (Signed)
Motrin 400 mg twice daily with food  Elevation............ heating pad........ low heat  Stop the antibiotics  Return when necessary

## 2013-11-14 NOTE — Progress Notes (Signed)
   Subjective:    Patient ID: Sean Mitchell., male    DOB: 1933-08-19, 80 y.o.   MRN: [id number]  HPI  Sean Mitchell is a 80 year old married male nonsmoker who comes in today accompanied by his wife Sean Mitchell for followup from the emergency room  He went emerge from 2 days ago with some swelling and erythema left upper leg. He was diagnosed with a boil and given antibiotics  Review of Systems    review of systems negative Objective:   Physical Exam  Well-developed well-nourished male no acute distress vital signs stable he is afebrile weight down 20 pounds from a combination of the chemotherapy side effects and the underlying disease.  Examination legs shows a clot in the superficial vein no infection      Assessment & Plan:  Superficial phlebitis,,,,,,,,,,, stop the antibiotics,,,,,, elevation heat anti-inflammatories

## 2013-11-15 ENCOUNTER — Telehealth: Payer: Self-pay | Admitting: Family Medicine

## 2013-11-15 NOTE — Telephone Encounter (Signed)
Relevant patient education mailed to patient.  

## 2013-11-18 ENCOUNTER — Telehealth: Payer: Self-pay | Admitting: Medical Oncology

## 2013-11-18 ENCOUNTER — Encounter: Payer: Self-pay | Admitting: Thoracic Surgery (Cardiothoracic Vascular Surgery)

## 2013-11-18 DIAGNOSIS — C450 Mesothelioma of pleura: Secondary | ICD-10-CM

## 2013-11-18 NOTE — Telephone Encounter (Signed)
Wife reports pt has "superficial clot " on right leg calf after being seen in ED. Was told to take Doxycycline, warm packs to site , elevate. His wife would like for someone to see it tomorrow. Note to [place].

## 2013-11-18 NOTE — Progress Notes (Signed)
Patient ID: Sean Ryner., male   DOB: 1933/10/16, 80 y.o.   MRN: [id number]  Documentation Note  [name] has malignant mesothelioma of the right pleura- that was the indication for the PET/Ct done in June

## 2013-11-19 ENCOUNTER — Telehealth: Payer: Self-pay | Admitting: *Deleted

## 2013-11-19 ENCOUNTER — Other Ambulatory Visit (HOSPITAL_BASED_OUTPATIENT_CLINIC_OR_DEPARTMENT_OTHER): Payer: Medicare Other

## 2013-11-19 DIAGNOSIS — C384 Malignant neoplasm of pleura: Secondary | ICD-10-CM

## 2013-11-19 DIAGNOSIS — C450 Mesothelioma of pleura: Secondary | ICD-10-CM

## 2013-11-19 LAB — COMPREHENSIVE METABOLIC PANEL (CC13)
ALK PHOS: 107 U/L (ref 40–150)
ALT: 9 U/L (ref 0–55)
ANION GAP: 8 meq/L (ref 3–11)
AST: 18 U/L (ref 5–34)
Albumin: 2.7 g/dL — ABNORMAL LOW (ref 3.5–5.0)
BILIRUBIN TOTAL: 0.22 mg/dL (ref 0.20–1.20)
BUN: 18.3 mg/dL (ref 7.0–26.0)
CO2: 27 meq/L (ref 22–29)
CREATININE: 1.1 mg/dL (ref 0.7–1.3)
Calcium: 9.2 mg/dL (ref 8.4–10.4)
Chloride: 104 mEq/L (ref 98–109)
GLUCOSE: 113 mg/dL (ref 70–140)
Potassium: 3.9 mEq/L (ref 3.5–5.1)
SODIUM: 139 meq/L (ref 136–145)
TOTAL PROTEIN: 6.1 g/dL — AB (ref 6.4–8.3)

## 2013-11-19 LAB — MAGNESIUM (CC13): Magnesium: 1.7 mg/dl (ref 1.5–2.5)

## 2013-11-19 LAB — CBC WITH DIFFERENTIAL/PLATELET
BASO%: 0.6 % (ref 0.0–2.0)
Basophils Absolute: 0.1 10*3/uL (ref 0.0–0.1)
EOS%: 0.3 % (ref 0.0–7.0)
Eosinophils Absolute: 0 10*3/uL (ref 0.0–0.5)
HEMATOCRIT: 36.8 % — AB (ref 38.4–49.9)
HGB: 11.8 g/dL — ABNORMAL LOW (ref 13.0–17.1)
LYMPH%: 16 % (ref 14.0–49.0)
MCH: 28 pg (ref 27.2–33.4)
MCHC: 32 g/dL (ref 32.0–36.0)
MCV: 87.8 fL (ref 79.3–98.0)
MONO#: 1 10*3/uL — ABNORMAL HIGH (ref 0.1–0.9)
MONO%: 6.1 % (ref 0.0–14.0)
NEUT#: 12.4 10*3/uL — ABNORMAL HIGH (ref 1.5–6.5)
NEUT%: 77 % — AB (ref 39.0–75.0)
PLATELETS: 176 10*3/uL (ref 140–400)
RBC: 4.2 10*6/uL (ref 4.20–5.82)
RDW: 14.8 % — ABNORMAL HIGH (ref 11.0–14.6)
WBC: 16.1 10*3/uL — ABNORMAL HIGH (ref 4.0–10.3)
lymph#: 2.6 10*3/uL (ref 0.9–3.3)

## 2013-11-19 NOTE — Telephone Encounter (Signed)
Pt in the lobby wanting RN to assess the "superficial clot" on his right leg.  Pt was seen in the ED and by his PCP.  Pt had started antibiotics given to him in the ED but his PCP d/c'd them stating it was phlebitis and he needs to take motrin, use heat compresses and elevate the leg.  Per dr Vista Mink, continue to follow orders per Dr Sherren Mocha (pt's PCP).  If it gets worse he needs to contact PCP.  Pt states that the redness is getting better.  Advised they call if anything else changes.  SLJ

## 2013-11-21 ENCOUNTER — Telehealth: Payer: Self-pay | Admitting: Medical Oncology

## 2013-11-21 NOTE — Telephone Encounter (Signed)
I told pt to f/y with Dr Sherren Mocha . HE stated "I think it is getting a little better".

## 2013-11-21 NOTE — Telephone Encounter (Signed)
Message copied by Ardeen Garland on Thu Nov 21, 2013  2:54 PM ------      Message from: Curt Bears      Created: Mon Nov 18, 2013  7:20 PM       Nothing else needed. Follow up with Dr. Sherren Mocha and if neededhim.      ----- Message -----         From: Ardeen Garland, RN         Sent: 11/18/2013   4:22 PM           To: Curt Bears, MD            Wife reports pt has "superficial clot " on right leg calf after being seen in ED on the 14th. He did f/u with Dr. Sherren Mocha after. Was initially told to take Doxycycline,( then Sherren Mocha told him to stop it) warm packs to site , elevate. His wife would like for someone to see it tomorrow. Anything else I need to do.?       ------

## 2013-11-26 ENCOUNTER — Other Ambulatory Visit (HOSPITAL_BASED_OUTPATIENT_CLINIC_OR_DEPARTMENT_OTHER): Payer: Medicare Other

## 2013-11-26 ENCOUNTER — Ambulatory Visit (HOSPITAL_BASED_OUTPATIENT_CLINIC_OR_DEPARTMENT_OTHER): Payer: Medicare Other | Admitting: Internal Medicine

## 2013-11-26 ENCOUNTER — Ambulatory Visit: Payer: Medicare Other

## 2013-11-26 ENCOUNTER — Ambulatory Visit (HOSPITAL_BASED_OUTPATIENT_CLINIC_OR_DEPARTMENT_OTHER): Payer: Medicare Other

## 2013-11-26 ENCOUNTER — Encounter: Payer: Self-pay | Admitting: Internal Medicine

## 2013-11-26 VITALS — BP 129/62 | HR 66

## 2013-11-26 VITALS — BP 131/66 | HR 67 | Temp 97.8°F | Resp 19 | Ht 68.0 in | Wt 155.8 lb

## 2013-11-26 DIAGNOSIS — C384 Malignant neoplasm of pleura: Secondary | ICD-10-CM

## 2013-11-26 DIAGNOSIS — Z5111 Encounter for antineoplastic chemotherapy: Secondary | ICD-10-CM

## 2013-11-26 DIAGNOSIS — C450 Mesothelioma of pleura: Secondary | ICD-10-CM

## 2013-11-26 LAB — COMPREHENSIVE METABOLIC PANEL (CC13)
ALT: 9 U/L (ref 0–55)
AST: 14 U/L (ref 5–34)
Albumin: 2.7 g/dL — ABNORMAL LOW (ref 3.5–5.0)
Alkaline Phosphatase: 77 U/L (ref 40–150)
Anion Gap: 11 mEq/L (ref 3–11)
BUN: 22.2 mg/dL (ref 7.0–26.0)
CALCIUM: 9.4 mg/dL (ref 8.4–10.4)
CO2: 23 meq/L (ref 22–29)
CREATININE: 1.4 mg/dL — AB (ref 0.7–1.3)
Chloride: 103 mEq/L (ref 98–109)
GLUCOSE: 134 mg/dL (ref 70–140)
POTASSIUM: 3.9 meq/L (ref 3.5–5.1)
Sodium: 137 mEq/L (ref 136–145)
Total Bilirubin: 0.26 mg/dL (ref 0.20–1.20)
Total Protein: 6.8 g/dL (ref 6.4–8.3)

## 2013-11-26 LAB — CBC WITH DIFFERENTIAL/PLATELET
BASO%: 0.3 % (ref 0.0–2.0)
BASOS ABS: 0 10*3/uL (ref 0.0–0.1)
EOS ABS: 0 10*3/uL (ref 0.0–0.5)
EOS%: 0 % (ref 0.0–7.0)
HEMATOCRIT: 34.4 % — AB (ref 38.4–49.9)
HEMOGLOBIN: 11.4 g/dL — AB (ref 13.0–17.1)
LYMPH#: 1.3 10*3/uL (ref 0.9–3.3)
LYMPH%: 10.8 % — ABNORMAL LOW (ref 14.0–49.0)
MCH: 28.7 pg (ref 27.2–33.4)
MCHC: 33 g/dL (ref 32.0–36.0)
MCV: 87 fL (ref 79.3–98.0)
MONO#: 0.9 10*3/uL (ref 0.1–0.9)
MONO%: 7.9 % (ref 0.0–14.0)
NEUT%: 81 % — AB (ref 39.0–75.0)
NEUTROS ABS: 9.6 10*3/uL — AB (ref 1.5–6.5)
Platelets: 408 10*3/uL — ABNORMAL HIGH (ref 140–400)
RBC: 3.96 10*6/uL — ABNORMAL LOW (ref 4.20–5.82)
RDW: 14.9 % — AB (ref 11.0–14.6)
WBC: 11.9 10*3/uL — ABNORMAL HIGH (ref 4.0–10.3)

## 2013-11-26 LAB — MAGNESIUM (CC13): MAGNESIUM: 2 mg/dL (ref 1.5–2.5)

## 2013-11-26 MED ORDER — SODIUM CHLORIDE 0.9 % IJ SOLN
10.0000 mL | INTRAMUSCULAR | Status: DC | PRN
  Filled 2013-11-26: qty 10

## 2013-11-26 MED ORDER — SODIUM CHLORIDE 0.9 % IV SOLN
150.0000 mg | Freq: Once | INTRAVENOUS | Status: AC
  Administered 2013-11-26: 150 mg via INTRAVENOUS
  Filled 2013-11-26: qty 5

## 2013-11-26 MED ORDER — DEXAMETHASONE SODIUM PHOSPHATE 20 MG/5ML IJ SOLN
12.0000 mg | Freq: Once | INTRAMUSCULAR | Status: AC
  Administered 2013-11-26: 12 mg via INTRAVENOUS

## 2013-11-26 MED ORDER — PALONOSETRON HCL INJECTION 0.25 MG/5ML
0.2500 mg | Freq: Once | INTRAVENOUS | Status: AC
  Administered 2013-11-26: 0.25 mg via INTRAVENOUS

## 2013-11-26 MED ORDER — POTASSIUM CHLORIDE 2 MEQ/ML IV SOLN
Freq: Once | INTRAVENOUS | Status: AC
  Administered 2013-11-26: 13:00:00 via INTRAVENOUS
  Filled 2013-11-26: qty 10

## 2013-11-26 MED ORDER — DEXAMETHASONE SODIUM PHOSPHATE 20 MG/5ML IJ SOLN
INTRAMUSCULAR | Status: AC
  Filled 2013-11-26: qty 5

## 2013-11-26 MED ORDER — SODIUM CHLORIDE 0.9 % IV SOLN
500.0000 mg/m2 | Freq: Once | INTRAVENOUS | Status: AC
  Administered 2013-11-26: 950 mg via INTRAVENOUS
  Filled 2013-11-26: qty 38

## 2013-11-26 MED ORDER — ACETAMINOPHEN 325 MG PO TABS
ORAL_TABLET | ORAL | Status: AC
  Filled 2013-11-26: qty 2

## 2013-11-26 MED ORDER — PALONOSETRON HCL INJECTION 0.25 MG/5ML
INTRAVENOUS | Status: AC
  Filled 2013-11-26: qty 5

## 2013-11-26 MED ORDER — HEPARIN SOD (PORK) LOCK FLUSH 100 UNIT/ML IV SOLN
500.0000 [IU] | Freq: Once | INTRAVENOUS | Status: DC | PRN
  Filled 2013-11-26: qty 5

## 2013-11-26 MED ORDER — ACETAMINOPHEN 325 MG PO TABS
650.0000 mg | ORAL_TABLET | Freq: Once | ORAL | Status: AC
  Administered 2013-11-26: 650 mg via ORAL

## 2013-11-26 MED ORDER — SODIUM CHLORIDE 0.9 % IV SOLN
75.0000 mg/m2 | Freq: Once | INTRAVENOUS | Status: AC
  Administered 2013-11-26: 143 mg via INTRAVENOUS
  Filled 2013-11-26: qty 143

## 2013-11-26 NOTE — Progress Notes (Signed)
Sean Mitchell [phone]   [phone]  OFFICE PROGRESS NOTE  Sean Man, MD [place] [address]  DIAGNOSIS: Malignant pleural mesothelioma  Primary site: Pleural Mesothelioma (Right)  Staging method: AJCC 7th Edition  Clinical: Stage II (T2, N0, M0) signed by Curt Bears, MD on 10/09/2013 12:58 PM  Summary: Stage II (T2, N0, M0)   PRIOR THERAPY: Status post right VATS with drainage of right pleural effusion and pleural biopsies on 09/25/2013 the care of Dr. Roxan Hockey   CURRENT THERAPY: Systemic chemotherapy with cisplatin 75 mg/m2 and Alimta 500 mg/m2 given every 3 weeks. Status post 2 cycles.  INTERVAL HISTORY: Sean Mitchell. 80 y.o. male returns to the clinic today for followup visit accompanied by his wife, daughter and son. The patient is feeling fine today with no specific complaints except for occasional upset stomach but no significant nausea or vomiting. He tolerated the second cycle of his systemic chemotherapy with cisplatin and Alimta followed by Neulasta injection fairly well. He denied having any significant weight loss or night sweats. He has no fever or chills. He denied having any significant chest pain but continues to have shortness of breath with exertion was no cough or hemoptysis. He is here today to start cycle #3 of his chemotherapy.  MEDICAL HISTORY: Past Medical History  Diagnosis Date  . Personal history of contact with and (suspected) exposure to asbestos   . Unspecified essential hypertension   . Other and unspecified hyperlipidemia   . Esophageal reflux   . Diverticulosis of colon (without mention of hemorrhage)   . Elevated prostate specific antigen (PSA)   . Osteoarthrosis, unspecified whether generalized or localized, unspecified site   . Anxiety state, unspecified   . Rectal mass   . Shortness of breath     due to fluid    ALLERGIES:  is allergic to  other.  MEDICATIONS:  Current Outpatient Prescriptions  Medication Sig Dispense Refill  . aspirin 81 MG tablet Take 81 mg by mouth daily.        Marland Kitchen atenolol (TENORMIN) 25 MG tablet Take 1 tablet (25 mg total) by mouth daily.  100 tablet  3  . dexamethasone (DECADRON) 4 MG tablet Take 4 mg by mouth 2 (two) times daily with a meal. 1 tablet twice daily the day before, the day of, and the day after chemo      . doxycycline (VIBRAMYCIN) 100 MG capsule Take 1 capsule (100 mg total) by mouth 2 (two) times daily.  20 capsule  0  . EPINEPHrine (EPIPEN 2-PAK) 0.3 mg/0.3 mL DEVI Inject 0.3 mLs (0.3 mg total) into the muscle once.  1 Device  1  . esomeprazole (NEXIUM) 20 MG capsule Take 20 mg by mouth daily at 12 noon.      . finasteride (PROSCAR) 5 MG tablet Take 1 tablet (5 mg total) by mouth as directed. Monday, Wednesday, Friday and Sunday  295 tablet  3  . folic acid (FOLVITE) 1 MG tablet Take 1 tablet (1 mg total) by mouth daily.  30 tablet  4  . ondansetron (ZOFRAN) 8 MG tablet Take 1 tablet (8 mg total) by mouth every 8 (eight) hours as needed for nausea or vomiting.  30 tablet  1  . pravastatin (PRAVACHOL) 40 MG tablet Take 40 mg by mouth as directed. Monday, Wednesday, Friday and Sunday      . prochlorperazine (COMPAZINE) 10 MG tablet Take 1 tablet (10 mg  total) by mouth every 6 (six) hours as needed for nausea or vomiting.  60 tablet  0  . terazosin (HYTRIN) 10 MG capsule Take 1 capsule (10 mg total) by mouth every evening.  100 capsule  3  . traMADol (ULTRAM) 50 MG tablet Take 1 tablet (50 mg total) by mouth every 6 (six) hours as needed.  30 tablet  0   No current facility-administered medications for this visit.    SURGICAL HISTORY:  Past Surgical History  Procedure Laterality Date  . Inguinal hernia repair  1989    left  . Colonoscopy w/ polypectomy    . Video assisted thoracoscopy (vats)/decortication Right 09/25/2013    Procedure: VIDEO ASSISTED THORACOSCOPY (VATS)/POSSIBLE  DECORTICATION;  Surgeon: Melrose Nakayama, MD;  Location: [facility];  Service: Thoracic;  Laterality: Right;  . Pleural effusion drainage Right 09/25/2013    Procedure: DRAINAGE OF PLEURAL EFFUSION;  Surgeon: Melrose Nakayama, MD;  Location: [facility];  Service: Thoracic;  Laterality: Right;    REVIEW OF SYSTEMS:  Constitutional: positive for fatigue Eyes: negative Ears, nose, mouth, throat, and face: negative Respiratory: negative Cardiovascular: negative Gastrointestinal: negative Genitourinary:negative Integument/breast: negative Hematologic/lymphatic: negative Musculoskeletal:negative Neurological: negative Behavioral/Psych: negative Endocrine: negative Allergic/Immunologic: negative   PHYSICAL EXAMINATION: General appearance: alert, cooperative, fatigued and no distress Head: Normocephalic, without obvious abnormality, atraumatic Neck: no adenopathy, no JVD, supple, symmetrical, trachea midline and thyroid not enlarged, symmetric, no tenderness/mass/nodules Lymph nodes: Cervical, supraclavicular, and axillary nodes normal. Resp: clear to auscultation bilaterally Back: symmetric, no curvature. ROM normal. No CVA tenderness. Cardio: regular rate and rhythm, S1, S2 normal, no murmur, click, rub or gallop GI: soft, non-tender; bowel sounds normal; no masses,  no organomegaly Extremities: extremities normal, atraumatic, no cyanosis or edema Neurologic: Alert and oriented X 3, normal strength and tone. Normal symmetric reflexes. Normal coordination and gait  ECOG PERFORMANCE STATUS: 1 - Symptomatic but completely ambulatory  There were no vitals taken for this visit.  LABORATORY DATA: Lab Results  Component Value Date   WBC 11.9* 11/26/2013   HGB 11.4* 11/26/2013   HCT 34.4* 11/26/2013   MCV 87.0 11/26/2013   PLT 408* 11/26/2013      Chemistry      Component Value Date/Time   NA 139 11/19/2013 1042   NA 135* 09/27/2013 0418   K 3.9 11/19/2013 1042   K 4.6 09/27/2013 0418   CL  102 09/27/2013 0418   CO2 27 11/19/2013 1042   CO2 23 09/27/2013 0418   BUN 18.3 11/19/2013 1042   BUN 14 09/27/2013 0418   CREATININE 1.1 11/19/2013 1042   CREATININE 1.11 09/27/2013 0418      Component Value Date/Time   CALCIUM 9.2 11/19/2013 1042   CALCIUM 8.3* 09/27/2013 0418   ALKPHOS 107 11/19/2013 1042   ALKPHOS 61 09/27/2013 0418   AST 18 11/19/2013 1042   AST 17 09/27/2013 0418   ALT 9 11/19/2013 1042   ALT 14 09/27/2013 0418   BILITOT 0.22 11/19/2013 1042   BILITOT 0.3 09/27/2013 0418       RADIOGRAPHIC STUDIES:  ASSESSMENT AND PLAN: This is a very pleasant 80 years old white male recently diagnosed with malignant pleural mesothelioma involving the right lung and currently undergoing systemic chemotherapy with cisplatin and Alimta status post 2 cycles. He tolerated the first cycle fairly well except for few episodes of nausea a few days after the chemotherapy. This is completely resolved but the patient continues to have occasional upset stomach with no nausea or  vomiting. The patient and and his family has questions today and I answered them completely to their satisfaction. I reviewed the previous scan imaging with the patient and his family especially his son who missed the initial appointment and wanted to review the case from the beginning. I recommended for him to proceed with cycle #3 today as scheduled  He would come back for followup visit in 3 weeks with the next cycle of his chemotherapy after repeating CT scan of the chest, abdomen and pelvis for restaging of his disease.  He was advised to call me immediately if he has any concerning symptoms in the interval. The patient voices understanding of current disease status and treatment options and is in agreement with the current care plan.  All questions were answered. The patient knows to call the clinic with any problems, questions or concerns. We can certainly see the patient much sooner if necessary.  I spent 20 minutes  counseling the patient face to face. The total time spent in the appointment was 30 minutes.  Disclaimer: This note was dictated with voice recognition software. Similar sounding words can inadvertently be transcribed and may not be corrected upon review.

## 2013-11-26 NOTE — Progress Notes (Signed)
Patient c/o headache and some nausea.  Dr. Julien Nordmann notified and received order for acteaminophen 650 mg .

## 2013-11-26 NOTE — Patient Instructions (Signed)
[facility] Discharge Instructions for Patients Receiving Chemotherapy  Today you received the following chemotherapy agents:  Cisplatin and Alimta  To help prevent nausea and vomiting after your treatment, we encourage you to take your nausea medication as ordered per MD.   If you develop nausea and vomiting that is not controlled by your nausea medication, call the clinic.   BELOW ARE SYMPTOMS THAT SHOULD BE REPORTED IMMEDIATELY:  *FEVER GREATER THAN 100.5 F  *CHILLS WITH OR WITHOUT FEVER  NAUSEA AND VOMITING THAT IS NOT CONTROLLED WITH YOUR NAUSEA MEDICATION  *UNUSUAL SHORTNESS OF BREATH  *UNUSUAL BRUISING OR BLEEDING  TENDERNESS IN MOUTH AND THROAT WITH OR WITHOUT PRESENCE OF ULCERS  *URINARY PROBLEMS  *BOWEL PROBLEMS  UNUSUAL RASH Items with * indicate a potential emergency and should be followed up as soon as possible.  Feel free to call the clinic you have any questions or concerns. The clinic phone number is [phone].

## 2013-11-29 ENCOUNTER — Telehealth: Payer: Self-pay | Admitting: Internal Medicine

## 2013-11-29 NOTE — Telephone Encounter (Signed)
s.w. pt and advised on Aug appt.Marland KitchenMarland Kitchenpt will pick up new sched at nxt visit

## 2013-12-02 ENCOUNTER — Other Ambulatory Visit: Payer: Self-pay | Admitting: Medical Oncology

## 2013-12-02 ENCOUNTER — Telehealth: Payer: Self-pay | Admitting: Medical Oncology

## 2013-12-02 NOTE — Telephone Encounter (Signed)
Pt agreed to Ct scan Abd/pelvis.

## 2013-12-02 NOTE — Telephone Encounter (Addendum)
I returned wife's call. He does not want CT abd and pelvis . I told wife it is up to pt .and that  Dr Julien Nordmann said he is ordering it because his cancer can invade peritoneal lining. She said he does not want Ct A/P.  Pt still has nausea. i recommend he take compazine every 6  hours for the next 2-3 days .

## 2013-12-03 ENCOUNTER — Ambulatory Visit (HOSPITAL_BASED_OUTPATIENT_CLINIC_OR_DEPARTMENT_OTHER): Payer: Medicare Other | Admitting: Nurse Practitioner

## 2013-12-03 ENCOUNTER — Other Ambulatory Visit (HOSPITAL_BASED_OUTPATIENT_CLINIC_OR_DEPARTMENT_OTHER): Payer: Medicare Other

## 2013-12-03 ENCOUNTER — Other Ambulatory Visit: Payer: Self-pay | Admitting: *Deleted

## 2013-12-03 ENCOUNTER — Telehealth: Payer: Self-pay | Admitting: *Deleted

## 2013-12-03 ENCOUNTER — Ambulatory Visit (HOSPITAL_COMMUNITY)
Admission: RE | Admit: 2013-12-03 | Discharge: 2013-12-03 | Disposition: A | Discharge status: Home / Self Care | Payer: Medicare Other | Source: Ambulatory Visit | Attending: Nurse Practitioner | Admitting: Nurse Practitioner

## 2013-12-03 ENCOUNTER — Encounter: Payer: Self-pay | Admitting: Nurse Practitioner

## 2013-12-03 VITALS — BP 136/72 | HR 59 | Temp 98.1°F | Resp 18 | Wt 154.9 lb

## 2013-12-03 DIAGNOSIS — T451X5A Adverse effect of antineoplastic and immunosuppressive drugs, initial encounter: Secondary | ICD-10-CM

## 2013-12-03 DIAGNOSIS — M7989 Other specified soft tissue disorders: Secondary | ICD-10-CM | POA: Insufficient documentation

## 2013-12-03 DIAGNOSIS — C384 Malignant neoplasm of pleura: Secondary | ICD-10-CM

## 2013-12-03 DIAGNOSIS — R600 Localized edema: Secondary | ICD-10-CM

## 2013-12-03 DIAGNOSIS — M79609 Pain in unspecified limb: Secondary | ICD-10-CM

## 2013-12-03 DIAGNOSIS — I82619 Acute embolism and thrombosis of superficial veins of unspecified upper extremity: Secondary | ICD-10-CM

## 2013-12-03 DIAGNOSIS — Z9221 Personal history of antineoplastic chemotherapy: Secondary | ICD-10-CM | POA: Insufficient documentation

## 2013-12-03 DIAGNOSIS — C450 Mesothelioma of pleura: Secondary | ICD-10-CM

## 2013-12-03 DIAGNOSIS — E8809 Other disorders of plasma-protein metabolism, not elsewhere classified: Secondary | ICD-10-CM

## 2013-12-03 DIAGNOSIS — I82611 Acute embolism and thrombosis of superficial veins of right upper extremity: Secondary | ICD-10-CM | POA: Insufficient documentation

## 2013-12-03 DIAGNOSIS — D6481 Anemia due to antineoplastic chemotherapy: Secondary | ICD-10-CM | POA: Insufficient documentation

## 2013-12-03 DIAGNOSIS — R609 Edema, unspecified: Secondary | ICD-10-CM

## 2013-12-03 LAB — CBC WITH DIFFERENTIAL/PLATELET
BASO%: 0.6 % (ref 0.0–2.0)
Basophils Absolute: 0 10*3/uL (ref 0.0–0.1)
EOS%: 0.7 % (ref 0.0–7.0)
Eosinophils Absolute: 0 10*3/uL (ref 0.0–0.5)
HCT: 33.4 % — ABNORMAL LOW (ref 38.4–49.9)
HGB: 10.7 g/dL — ABNORMAL LOW (ref 13.0–17.1)
LYMPH%: 28.1 % (ref 14.0–49.0)
MCH: 28.2 pg (ref 27.2–33.4)
MCHC: 32.1 g/dL (ref 32.0–36.0)
MCV: 87.8 fL (ref 79.3–98.0)
MONO#: 0.2 10*3/uL (ref 0.1–0.9)
MONO%: 3.6 % (ref 0.0–14.0)
NEUT%: 67 % (ref 39.0–75.0)
NEUTROS ABS: 3.3 10*3/uL (ref 1.5–6.5)
PLATELETS: 251 10*3/uL (ref 140–400)
RBC: 3.81 10*6/uL — AB (ref 4.20–5.82)
RDW: 15.4 % — AB (ref 11.0–14.6)
WBC: 5 10*3/uL (ref 4.0–10.3)
lymph#: 1.4 10*3/uL (ref 0.9–3.3)

## 2013-12-03 LAB — COMPREHENSIVE METABOLIC PANEL (CC13)
ALBUMIN: 2.7 g/dL — AB (ref 3.5–5.0)
ALK PHOS: 72 U/L (ref 40–150)
ALT: 7 U/L (ref 0–55)
AST: 16 U/L (ref 5–34)
Anion Gap: 8 mEq/L (ref 3–11)
BILIRUBIN TOTAL: 0.46 mg/dL (ref 0.20–1.20)
BUN: 25.6 mg/dL (ref 7.0–26.0)
CO2: 27 mEq/L (ref 22–29)
Calcium: 9 mg/dL (ref 8.4–10.4)
Chloride: 101 mEq/L (ref 98–109)
Creatinine: 1.2 mg/dL (ref 0.7–1.3)
Glucose: 86 mg/dl (ref 70–140)
POTASSIUM: 4.4 meq/L (ref 3.5–5.1)
Sodium: 136 mEq/L (ref 136–145)
TOTAL PROTEIN: 6.3 g/dL — AB (ref 6.4–8.3)

## 2013-12-03 LAB — MAGNESIUM (CC13): Magnesium: 1.7 mg/dl (ref 1.5–2.5)

## 2013-12-03 NOTE — Assessment & Plan Note (Signed)
11 has decreased from 11.4-10.7.  Patient continues to deny any issues with either shortness of breath or worsening of fatigue.  Will continue to monitor.

## 2013-12-03 NOTE — Telephone Encounter (Signed)
Pt came as a walk-in wanting to see nurse.  Spoke with pt and was informed that pt developed red raised area on anterior of right forearm about 2 days ago.  Pt stated he has hx of clot in right leg, and pt only takes ASA 81mg  daily.  Pt stated some discomfort when pressing on the raised spot.   The raised spot was not related to peripheral IV site from 11/26/13. Retta Mac, NP notified.  Pt was brought to exam room for NP to further evaluate.

## 2013-12-03 NOTE — Progress Notes (Signed)
[facility]   Chief Complaint  Patient presents with  . Arm Swelling    HPI: Sean Mitchell. 80 y.o. male diagnosed with malignant pleural mesothelioma. Currently undergoing cisplatin/Alimta chemotherapy regimen.  He received cycle 3 of his chemotherapy on 11/27/1998.  Patient called the cancer Center today requesting an urgent care visit today.  Patient has noted a right forearm area of erythema, edema, warmth, and mild tenderness.  Patient was noted to have a peripheral IV stick for his latest chemotherapy to that same arm area.  I patient also was diagnosed with a superficial thrombosis of his right lower extremity approximate 3 weeks ago.  He denies any recent fevers or chills.  He also denies any other symptoms whatsoever.  HPI  CURRENT THERAPY: Upcoming Treatment Dates - LUNG MESOTHELIOMA Pemetrexed / Cisplatin q21d Days with orders from any treatment category:  12/17/2013      [facility] ON[facility] SCHEDULING COMMUNICATION      palonosetron (ALOXI) injection 0.25 mg      Dexamethasone Sodium Phosphate (DECADRON) injection 12 mg      fosaprepitant (EMEND) 150 mg in sodium chloride 0.9 % 145 mL IVPB      PEMEtrexed (ALIMTA) 950 mg in sodium chloride 0.9 % 100 mL chemo infusion      CISplatin (PLATINOL) 143 mg in sodium chloride 0.9 % 500 mL chemo infusion      sodium chloride 0.9 % injection 10 mL      heparin lock flush 100 unit/mL      heparin lock flush 100 unit/mL      alteplase (CATHFLO ACTIVASE) injection 2 mg      sodium chloride 0.9 % injection 3 mL      dextrose 5 % and 0.45% NaCl 1,000 mL with potassium chloride 20 mEq, magnesium sulfate 12 mEq, mannitol 12.5 g infusion      TREATMENT CONDITIONS 01/07/2014      [facility] ONC SCHEDULING COMMUNICATION      palonosetron (ALOXI) injection 0.25 mg      Dexamethasone Sodium Phosphate (DECADRON) injection 12 mg      fosaprepitant (EMEND) 150 mg in sodium chloride 0.9 % 145 mL IVPB      PEMEtrexed (ALIMTA) 950 mg in sodium  chloride 0.9 % 100 mL chemo infusion      CISplatin (PLATINOL) 143 mg in sodium chloride 0.9 % 500 mL chemo infusion      sodium chloride 0.9 % injection 10 mL      heparin lock flush 100 unit/mL      heparin lock flush 100 unit/mL      alteplase (CATHFLO ACTIVASE) injection 2 mg      sodium chloride 0.9 % injection 3 mL      dextrose 5 % and 0.45% NaCl 1,000 mL with potassium chloride 20 mEq, magnesium sulfate 12 mEq, mannitol 12.5 g infusion      TREATMENT CONDITIONS 01/28/2014      [facility] ONC SCHEDULING COMMUNICATION      PALONOSETRON HCL INJECTION 0.25 MG/5ML      DEXAMETHASONE INJECTION ORDERABLE [facility]      FOSAPREPITANT IV INFUSION 150 MG      PEMEtrexed (ALIMTA) 950 mg in sodium chloride 0.9 % 100 mL chemo infusion      CISplatin (PLATINOL) 143 mg in sodium chloride 0.9 % 500 mL chemo infusion      SODIUM CHLORIDE 0.9 % IJ SOLN      HEPARIN SODIUM LOCK FLUSH 100 UNIT/ML IV SOLN  HEPARIN SODIUM LOCK FLUSH 100 UNIT/ML IV SOLN      ALTEPLASE 2 MG IJ SOLR      SODIUM CHLORIDE 0.9 % IJ SOLN      cyanocobalamin ((VITAMIN B-12)) injection 1,000 mcg      D5-1/2NS + KCL + MG +/- MANNITOL      TREATMENT CONDITIONS    Review of Systems  Constitutional: Negative.  Negative for fever and chills.  HENT: Negative.   Eyes: Negative.   Respiratory: Negative.   Cardiovascular: Negative.   Gastrointestinal: Negative.   Genitourinary: Negative.   Musculoskeletal: Negative.   Skin: Negative for itching and rash.       Complaint of right forearm erythema, warmth, and edema.  Right lower medial leg superficial thrombosis site with all erythema/warmth/edema resolved; the patient continues with palpable mass to the site.  Neurological: Negative.   Endo/Heme/Allergies: Negative.   Psychiatric/Behavioral: Negative.   All other systems reviewed and are negative.   Past Medical History  Diagnosis Date  . Personal history of contact with and (suspected) exposure to asbestos   . Unspecified  essential hypertension   . Other and unspecified hyperlipidemia   . Esophageal reflux   . Diverticulosis of colon (without mention of hemorrhage)   . Elevated prostate specific antigen (PSA)   . Osteoarthrosis, unspecified whether generalized or localized, unspecified site   . Anxiety state, unspecified   . Rectal mass   . Shortness of breath     due to fluid    Past Surgical History  Procedure Laterality Date  . Inguinal hernia repair  1989    left  . Colonoscopy w/ polypectomy    . Video assisted thoracoscopy (vats)/decortication Right 09/25/2013    Procedure: VIDEO ASSISTED THORACOSCOPY (VATS)/POSSIBLE DECORTICATION;  Surgeon: Melrose Nakayama, MD;  Location: [facility];  Service: Thoracic;  Laterality: Right;  . Pleural effusion drainage Right 09/25/2013    Procedure: DRAINAGE OF PLEURAL EFFUSION;  Surgeon: Melrose Nakayama, MD;  Location: [facility];  Service: Thoracic;  Laterality: Right;    has HYPERLIPIDEMIA; PERIPHERAL NEUROPATHY; HYPERTENSION; GERD; DEGENERATIVE JOINT DISEASE; ELEVATED PROSTATE SPECIFIC ANTIGEN; HISTORY OF ASBESTOS EXPOSURE; Hearing loss, conductive, bilateral; Benign paroxysmal positional vertigo; Pneumonia, viral; Pleural effusion, right; CAP (community acquired pneumonia); Pleural effusion; Pneumonia; Malignant pleural mesothelioma; Superficial phlebitis; Superficial venous thrombosis of right upper extremity; Antineoplastic chemotherapy induced anemia(285.3); and Hypoalbuminemia on his problem list.     is allergic to other.    Medication List       This list is accurate as of: 12/03/13  3:13 PM.  Always use your most recent med list.               aspirin 81 MG tablet  Take 81 mg by mouth daily.     atenolol 25 MG tablet  Commonly known as:  TENORMIN  Take 1 tablet (25 mg total) by mouth daily.     dexamethasone 4 MG tablet  Commonly known as:  DECADRON  Take 4 mg by mouth 2 (two) times daily with a meal. 1 tablet twice daily the day before, the  day of, and the day after chemo     EPINEPHrine 0.3 mg/0.3 mL Soaj injection  Commonly known as:  EPIPEN 2-PAK  Inject 0.3 mLs (0.3 mg total) into the muscle once.     esomeprazole 20 MG capsule  Commonly known as:  NEXIUM  Take 20 mg by mouth daily at 12 noon.     finasteride 5 MG tablet  Commonly known as:  PROSCAR  Take 1 tablet (5 mg total) by mouth as directed. Monday, Wednesday, Friday and Sunday     folic acid 1 MG tablet  Commonly known as:  FOLVITE  Take 1 tablet (1 mg total) by mouth daily.     HYDROcodone-homatropine 5-1.5 MG/5ML syrup  Commonly known as:  HYCODAN  Take 5 mLs by mouth every 6 (six) hours as needed for cough.     ondansetron 8 MG tablet  Commonly known as:  ZOFRAN  Take 1 tablet (8 mg total) by mouth every 8 (eight) hours as needed for nausea or vomiting.     prochlorperazine 10 MG tablet  Commonly known as:  COMPAZINE  Take 1 tablet (10 mg total) by mouth every 6 (six) hours as needed for nausea or vomiting.     terazosin 10 MG capsule  Commonly known as:  HYTRIN  Take 1 capsule (10 mg total) by mouth every evening.     traMADol 50 MG tablet  Commonly known as:  ULTRAM  Take 1 tablet (50 mg total) by mouth every 6 (six) hours as needed.         PHYSICAL EXAMINATION  Blood pressure 136/72, pulse 59, temperature 98.1 F (36.7 C), temperature source Oral, resp. rate 18, weight 154 lb 14.4 oz (70.262 kg), SpO2 98.00%.  Physical Exam  Nursing note and vitals reviewed. Constitutional: He is oriented to person, place, and time and well-developed, well-nourished, and in no distress.  HENT:  Head: Normocephalic and atraumatic.  Eyes: Conjunctivae are normal. Pupils are equal, round, and reactive to light.  Neck: Normal range of motion. Neck supple.  Cardiovascular: Normal rate, regular rhythm and normal heart sounds.   Pulmonary/Chest: Effort normal and breath sounds normal.  Abdominal: Soft. Bowel sounds are normal.  Musculoskeletal: Normal  range of motion. He exhibits edema and tenderness.  Right wrist area with healing bruise from most recent peripheral IV stick; with no evidence of infection.  proximal to that-right forearm with erythema, warmth, edema noted.  No evidence of red streaks.  Neurological: He is alert and oriented to person, place, and time. Gait normal.  Skin: Skin is warm and dry. No rash noted. There is erythema. No pallor.     Psychiatric: Affect normal.     LABORATORY DATA:. CBC  Lab Results  Component Value Date   WBC 5.0 Dec 20, 2013   RBC 3.81* 2013-12-20   HGB 10.7* 12/20/2013   HCT 33.4* 20-Dec-2013   PLT 251 12/20/13   MCV 87.8 12/20/2013   MCH 28.2 20-Dec-2013   MCHC 32.1 12/20/2013   RDW 15.4* 20-Dec-2013   LYMPHSABS 1.4 2013-12-20   MONOABS 0.2 20-Dec-2013   EOSABS 0.0 20-Dec-2013   BASOSABS 0.0 2013-12-20     CMET  Lab Results  Component Value Date   NA 136 2013/12/20   K 4.4 12/20/2013   CL 102 09/27/2013   CO2 27 12/20/2013   GLUCOSE 86 12/20/13   BUN 25.6 12-20-13   CREATININE 1.2 Dec 20, 2013   CALCIUM 9.0 12/20/2013   PROT 6.3* 12/20/2013   ALBUMIN 2.7* 2013/12/20   AST 16 December 20, 2013   ALT 7 12-20-2013   ALKPHOS 72 2013-12-20   BILITOT 0.46 12/20/13   GFRNONAA 62* 09/27/2013   GFRAA 72* 09/27/2013      RADIOGRAPHIC STUDIES: [place] D Slaughter at 2013-12-20 2:02 PM     Status: Signed        VASCULAR LAB  PRELIMINARY PRELIMINARY PRELIMINARY PRELIMINARY  Right upper extremity  venous duplex completed.  Preliminary report: No evidence of right upper extremity deep vein thrombosis. There is evidence of superficial thrombosis of the mid to proximal basilic and cephalic veins in the forearm of the right upper extremity,  SLAUGHTER, [place], RVS  12/03/2013, 2:02 PM     ASSESSMENT/PLAN:    Malignant pleural mesothelioma  Assessment & Plan Patient will continue cisplatin/Alimta chemotherapy regimen on an every three-week basis.  Patient received cycle 3 of his chemotherapy regimen on 11/26/2013.  He will be  scheduled for cycle 4 of the same chemotherapy regimen on 12/17/2013.   Superficial venous thrombosis of right upper extremity  Assessment & Plan Right upper extremity Doppler ultrasound revealed no evidence of right upper extremity deep vein thrombosis.  Patient was positive for superficial thrombosis of the mid to proximal basilic and cephalic veins in the form of the right upper extremity.  Patient was encouraged to use warm compresses to the site; and to take ibuprofen to help relieve the inflammation.  Patient is also advised to call if he develops any redness, warmth, or red streaks to the site.   Antineoplastic chemotherapy induced anemia(285.3)  Assessment & Plan 11 has decreased from 11.4-10.7.  Patient continues to deny any issues with either shortness of breath or worsening of fatigue.  Will continue to monitor.   Hypoalbuminemia  Assessment & Plan Patient continues with low albumin.  Patient was encouraged to push protein is much as possible.    Patient stated understanding of all instructions; and was in agreement with this plan of care. The patient knows to call the clinic with any problems, questions or concerns.   Review/collaboration with Dr. Julien Nordmann egarding all aspects of patient's visit today.   Total time spent with patient was 40 minutes;  with greater than 70 percent of that time spent in face to face counseling regarding his symptoms, review of Doppler ultrasound results with the patient and his daughter, and coordination of care and follow up.  Disclaimer: This note was dictated with voice recognition software. Similar sounding words can inadvertently be transcribed and may not be corrected upon review.   Drue Second, NP 12/03/2013

## 2013-12-03 NOTE — Assessment & Plan Note (Addendum)
Right upper extremity Doppler ultrasound revealed no evidence of right upper extremity deep vein thrombosis.  Patient was positive for superficial thrombosis of the mid to proximal basilic and cephalic veins in the form of the right upper extremity.  Patient was encouraged to use warm compresses to the site; and to take ibuprofen to help relieve the inflammation.  Patient is also advised to call if he develops any redness, warmth, or red streaks to the site.

## 2013-12-03 NOTE — Assessment & Plan Note (Signed)
Patient will continue cisplatin/Alimta chemotherapy regimen on an every three-week basis.  Patient received cycle 3 of his chemotherapy regimen on 11/26/2013.  He will be scheduled for cycle 4 of the same chemotherapy regimen on 12/17/2013.

## 2013-12-03 NOTE — Progress Notes (Signed)
VASCULAR LAB PRELIMINARY  PRELIMINARY  PRELIMINARY  PRELIMINARY  Right upper extremity venous duplex completed.    Preliminary report: No evidence of right upper extremity deep vein thrombosis. There is evidence of superficial thrombosis of the mid to proximal basilic and cephalic veins in the forearm of the right upper extremity,  Sedalia Greeson, RVS 12/03/2013, 2:02 PM

## 2013-12-03 NOTE — Assessment & Plan Note (Signed)
Patient continues with low albumin.  Patient was encouraged to push protein is much as possible.

## 2013-12-04 ENCOUNTER — Telehealth: Payer: Self-pay | Admitting: *Deleted

## 2013-12-04 NOTE — Telephone Encounter (Signed)
Reports swelling in his arm has decreased and area is less red and tender now.

## 2013-12-09 ENCOUNTER — Other Ambulatory Visit: Payer: Medicare Other

## 2013-12-10 ENCOUNTER — Other Ambulatory Visit (HOSPITAL_BASED_OUTPATIENT_CLINIC_OR_DEPARTMENT_OTHER): Payer: Medicare Other

## 2013-12-10 DIAGNOSIS — C450 Mesothelioma of pleura: Secondary | ICD-10-CM

## 2013-12-10 DIAGNOSIS — C384 Malignant neoplasm of pleura: Secondary | ICD-10-CM

## 2013-12-10 LAB — COMPREHENSIVE METABOLIC PANEL
ALK PHOS: 73 U/L (ref 39–117)
ALT: <8 U/L (ref 0–53)
AST: 14 U/L (ref 0–37)
Albumin: 3.3 g/dL — ABNORMAL LOW (ref 3.5–5.2)
BUN: 21 mg/dL (ref 6–23)
CALCIUM: 8.6 mg/dL (ref 8.4–10.5)
CO2: 28 mEq/L (ref 19–32)
CREATININE: 1.32 mg/dL (ref 0.50–1.35)
Chloride: 101 mEq/L (ref 96–112)
Glucose, Bld: 86 mg/dL (ref 70–99)
Potassium: 4.1 mEq/L (ref 3.5–5.3)
Sodium: 136 mEq/L (ref 135–145)
Total Bilirubin: 0.3 mg/dL (ref 0.2–1.2)
Total Protein: 5.9 g/dL — ABNORMAL LOW (ref 6.0–8.3)

## 2013-12-10 LAB — CBC WITH DIFFERENTIAL/PLATELET
BASO%: 0.3 % (ref 0.0–2.0)
BASOS ABS: 0 10*3/uL (ref 0.0–0.1)
EOS%: 1.3 % (ref 0.0–7.0)
Eosinophils Absolute: 0.1 10*3/uL (ref 0.0–0.5)
HEMATOCRIT: 32.5 % — AB (ref 38.4–49.9)
HEMOGLOBIN: 10.5 g/dL — AB (ref 13.0–17.1)
LYMPH%: 27.3 % (ref 14.0–49.0)
MCH: 28.5 pg (ref 27.2–33.4)
MCHC: 32.2 g/dL (ref 32.0–36.0)
MCV: 88.6 fL (ref 79.3–98.0)
MONO#: 0.5 10*3/uL (ref 0.1–0.9)
MONO%: 11.8 % (ref 0.0–14.0)
NEUT#: 2.5 10*3/uL (ref 1.5–6.5)
NEUT%: 59.3 % (ref 39.0–75.0)
Platelets: 190 10*3/uL (ref 140–400)
RBC: 3.67 10*6/uL — ABNORMAL LOW (ref 4.20–5.82)
RDW: 16.5 % — ABNORMAL HIGH (ref 11.0–14.6)
WBC: 4.2 10*3/uL (ref 4.0–10.3)
lymph#: 1.2 10*3/uL (ref 0.9–3.3)

## 2013-12-10 LAB — MAGNESIUM: Magnesium: 1.6 mg/dL (ref 1.5–2.5)

## 2013-12-16 ENCOUNTER — Ambulatory Visit (HOSPITAL_COMMUNITY)
Admission: RE | Admit: 2013-12-16 | Discharge: 2013-12-16 | Disposition: A | Discharge status: Home / Self Care | Payer: Medicare Other | Source: Ambulatory Visit | Attending: Internal Medicine | Admitting: Internal Medicine

## 2013-12-16 ENCOUNTER — Other Ambulatory Visit: Payer: Self-pay | Admitting: *Deleted

## 2013-12-16 DIAGNOSIS — C450 Mesothelioma of pleura: Secondary | ICD-10-CM

## 2013-12-16 DIAGNOSIS — K4020 Bilateral inguinal hernia, without obstruction or gangrene, not specified as recurrent: Secondary | ICD-10-CM | POA: Diagnosis not present

## 2013-12-16 DIAGNOSIS — C384 Malignant neoplasm of pleura: Secondary | ICD-10-CM | POA: Diagnosis present

## 2013-12-16 DIAGNOSIS — N400 Enlarged prostate without lower urinary tract symptoms: Secondary | ICD-10-CM | POA: Insufficient documentation

## 2013-12-16 DIAGNOSIS — R599 Enlarged lymph nodes, unspecified: Secondary | ICD-10-CM | POA: Insufficient documentation

## 2013-12-16 MED ORDER — IOHEXOL 300 MG/ML  SOLN
100.0000 mL | Freq: Once | INTRAMUSCULAR | Status: AC | PRN
  Administered 2013-12-16: 100 mL via INTRAVENOUS

## 2013-12-16 MED ORDER — PROCHLORPERAZINE MALEATE 10 MG PO TABS: 10.0000 mg | ORAL_TABLET | Freq: Four times a day (QID) | ORAL | Status: DC | PRN

## 2013-12-16 MED ORDER — TRAMADOL HCL 50 MG PO TABS: 50.0000 mg | ORAL_TABLET | Freq: Four times a day (QID) | ORAL | Status: AC | PRN

## 2013-12-16 MED ORDER — FOLIC ACID 1 MG PO TABS: 1.0000 mg | ORAL_TABLET | Freq: Every day | ORAL | Status: AC

## 2013-12-17 ENCOUNTER — Other Ambulatory Visit: Payer: Medicare Other

## 2013-12-17 ENCOUNTER — Ambulatory Visit: Payer: Medicare Other | Admitting: Physician Assistant

## 2013-12-17 ENCOUNTER — Encounter: Payer: Self-pay | Admitting: Nurse Practitioner

## 2013-12-17 ENCOUNTER — Ambulatory Visit (HOSPITAL_BASED_OUTPATIENT_CLINIC_OR_DEPARTMENT_OTHER): Payer: Medicare Other | Admitting: Nurse Practitioner

## 2013-12-17 ENCOUNTER — Ambulatory Visit: Payer: Medicare Other

## 2013-12-17 ENCOUNTER — Other Ambulatory Visit (HOSPITAL_BASED_OUTPATIENT_CLINIC_OR_DEPARTMENT_OTHER): Payer: Medicare Other

## 2013-12-17 ENCOUNTER — Telehealth: Payer: Self-pay | Admitting: Nurse Practitioner

## 2013-12-17 VITALS — BP 137/74 | HR 63 | Temp 97.9°F | Resp 18 | Ht 68.0 in | Wt 153.5 lb

## 2013-12-17 DIAGNOSIS — I8000 Phlebitis and thrombophlebitis of superficial vessels of unspecified lower extremity: Secondary | ICD-10-CM

## 2013-12-17 DIAGNOSIS — R5383 Other fatigue: Secondary | ICD-10-CM | POA: Insufficient documentation

## 2013-12-17 DIAGNOSIS — E8809 Other disorders of plasma-protein metabolism, not elsewhere classified: Secondary | ICD-10-CM

## 2013-12-17 DIAGNOSIS — R079 Chest pain, unspecified: Secondary | ICD-10-CM

## 2013-12-17 DIAGNOSIS — G8929 Other chronic pain: Secondary | ICD-10-CM

## 2013-12-17 DIAGNOSIS — C384 Malignant neoplasm of pleura: Secondary | ICD-10-CM

## 2013-12-17 DIAGNOSIS — I82611 Acute embolism and thrombosis of superficial veins of right upper extremity: Secondary | ICD-10-CM

## 2013-12-17 DIAGNOSIS — C450 Mesothelioma of pleura: Secondary | ICD-10-CM

## 2013-12-17 DIAGNOSIS — R5381 Other malaise: Secondary | ICD-10-CM | POA: Insufficient documentation

## 2013-12-17 DIAGNOSIS — R530 Neoplastic (malignant) related fatigue: Secondary | ICD-10-CM

## 2013-12-17 LAB — COMPREHENSIVE METABOLIC PANEL (CC13)
ALT: 6 U/L (ref 0–55)
ANION GAP: 7 meq/L (ref 3–11)
AST: 15 U/L (ref 5–34)
Albumin: 3 g/dL — ABNORMAL LOW (ref 3.5–5.0)
Alkaline Phosphatase: 83 U/L (ref 40–150)
BILIRUBIN TOTAL: 0.21 mg/dL (ref 0.20–1.20)
BUN: 19.4 mg/dL (ref 7.0–26.0)
CO2: 29 meq/L (ref 22–29)
Calcium: 9.6 mg/dL (ref 8.4–10.4)
Chloride: 102 mEq/L (ref 98–109)
Creatinine: 1.3 mg/dL (ref 0.7–1.3)
GLUCOSE: 102 mg/dL (ref 70–140)
Potassium: 4.4 mEq/L (ref 3.5–5.1)
Sodium: 138 mEq/L (ref 136–145)
Total Protein: 6.8 g/dL (ref 6.4–8.3)

## 2013-12-17 LAB — CBC WITH DIFFERENTIAL/PLATELET
BASO%: 0.4 % (ref 0.0–2.0)
Basophils Absolute: 0 10*3/uL (ref 0.0–0.1)
EOS%: 2.1 % (ref 0.0–7.0)
Eosinophils Absolute: 0.1 10*3/uL (ref 0.0–0.5)
HEMATOCRIT: 33.4 % — AB (ref 38.4–49.9)
HGB: 10.9 g/dL — ABNORMAL LOW (ref 13.0–17.1)
LYMPH%: 38.3 % (ref 14.0–49.0)
MCH: 28.7 pg (ref 27.2–33.4)
MCHC: 32.6 g/dL (ref 32.0–36.0)
MCV: 88.1 fL (ref 79.3–98.0)
MONO#: 0.6 10*3/uL (ref 0.1–0.9)
MONO%: 16 % — ABNORMAL HIGH (ref 0.0–14.0)
NEUT#: 1.7 10*3/uL (ref 1.5–6.5)
NEUT%: 43.2 % (ref 39.0–75.0)
PLATELETS: 269 10*3/uL (ref 140–400)
RBC: 3.79 10*6/uL — ABNORMAL LOW (ref 4.20–5.82)
RDW: 18.4 % — ABNORMAL HIGH (ref 11.0–14.6)
WBC: 3.9 10*3/uL — AB (ref 4.0–10.3)
lymph#: 1.5 10*3/uL (ref 0.9–3.3)

## 2013-12-17 LAB — MAGNESIUM (CC13): MAGNESIUM: 1.9 mg/dL (ref 1.5–2.5)

## 2013-12-17 NOTE — Assessment & Plan Note (Signed)
Patient continues with a decreased albumin level.  Long discussion with both patient and his family regarding the need to push protein in his diet.

## 2013-12-17 NOTE — Progress Notes (Signed)
[place]   Chief Complaint  Patient presents with  . Follow-up    HPI: Sean Mitchell. 80 y.o. male diagnosed with mesothelioma.  Currently undergoing cisplatin/Alimta chemotherapy regimen.  Patient has received a total of 3 cycles of his chemotherapy so far; and was originally scheduled to receive his next cycle of chemotherapy today-however, patient prefers to review his restaging CT results at today and to receive his next cycle of chemotherapy next Tuesday, 12/24/2013.  Patient continues to complain of some mild, chronic fatigue and deconditioning.  He states that he has not been as active as he should be.  He is interested in a physical therapy referral for strength training.  Also, patient has been diagnosed recently with a superficial venous thrombosis to the right forearm; as well as a right lower leg superficial thrombosis several weeks ago.  He has been using compresses and ibuprofen to treat these.  He states that his leg superficial clot has completely resolved; in his right forearm site is almost completely resolved as well.  Sean Mitchell denies any recent fever or chills.  He states that he has been trying to push protein in his diet.  HPI  CURRENT THERAPY: Upcoming Treatment Dates - LUNG MESOTHELIOMA Pemetrexed / Cisplatin q21d Days with orders from any treatment category:  12/17/2013      SCHEDULING COMMUNICATION      palonosetron (ALOXI) injection 0.25 mg      Dexamethasone Sodium Phosphate (DECADRON) injection 12 mg      fosaprepitant (EMEND) 150 mg in sodium chloride 0.9 % 145 mL IVPB      PEMEtrexed (ALIMTA) 950 mg in sodium chloride 0.9 % 100 mL chemo infusion      CISplatin (PLATINOL) 143 mg in sodium chloride 0.9 % 500 mL chemo infusion      sodium chloride 0.9 % injection 10 mL      heparin lock flush 100 unit/mL      heparin lock flush 100 unit/mL      alteplase (CATHFLO ACTIVASE) injection 2 mg      sodium chloride 0.9 % injection 3 mL     dextrose 5 % and 0.45% NaCl 1,000 mL with potassium chloride 20 mEq, magnesium sulfate 12 mEq, mannitol 12.5 g infusion      TREATMENT CONDITIONS 01/07/2014      SCHEDULING COMMUNICATION      palonosetron (ALOXI) injection 0.25 mg      Dexamethasone Sodium Phosphate (DECADRON) injection 12 mg      fosaprepitant (EMEND) 150 mg in sodium chloride 0.9 % 145 mL IVPB      PEMEtrexed (ALIMTA) 950 mg in sodium chloride 0.9 % 100 mL chemo infusion      CISplatin (PLATINOL) 143 mg in sodium chloride 0.9 % 500 mL chemo infusion      sodium chloride 0.9 % injection 10 mL      heparin lock flush 100 unit/mL      heparin lock flush 100 unit/mL      alteplase (CATHFLO ACTIVASE) injection 2 mg      sodium chloride 0.9 % injection 3 mL      dextrose 5 % and 0.45% NaCl 1,000 mL with potassium chloride 20 mEq, magnesium sulfate 12 mEq, mannitol 12.5 g infusion      TREATMENT CONDITIONS 01/28/2014      [facility] SCHEDULING COMMUNICATION      PALONOSETRON HCL INJECTION 0.25 MG/5ML      DEXAMETHASONE INJECTION ORDERABLE [facility]  FOSAPREPITANT IV INFUSION 150 MG      PEMEtrexed (ALIMTA) 950 mg in sodium chloride 0.9 % 100 mL chemo infusion      CISplatin (PLATINOL) 143 mg in sodium chloride 0.9 % 500 mL chemo infusion      SODIUM CHLORIDE 0.9 % IJ SOLN      HEPARIN SODIUM LOCK FLUSH 100 UNIT/ML IV SOLN      HEPARIN SODIUM LOCK FLUSH 100 UNIT/ML IV SOLN      ALTEPLASE 2 MG IJ SOLR      SODIUM CHLORIDE 0.9 % IJ SOLN      cyanocobalamin ((VITAMIN B-12)) injection 1,000 mcg      D5-1/2NS + KCL + MG +/- MANNITOL      TREATMENT CONDITIONS    Review of Systems  Constitutional: Positive for malaise/fatigue. Negative for fever and chills.  HENT: Negative.   Eyes: Negative.   Respiratory: Negative.   Cardiovascular: Negative.   Gastrointestinal: Negative.   Genitourinary: Negative.   Musculoskeletal:       Continues to complain of some mild, chronic right-sided rib area pain.  He states he takes a tramadol  on a when necessary basis for this.  Skin: Negative.   Neurological: Negative.   Endo/Heme/Allergies: Negative.   Psychiatric/Behavioral: Negative.   All other systems reviewed and are negative.   Past Medical History  Diagnosis Date  . Personal history of contact with and (suspected) exposure to asbestos   . Unspecified essential hypertension   . Other and unspecified hyperlipidemia   . Esophageal reflux   . Diverticulosis of colon (without mention of hemorrhage)   . Elevated prostate specific antigen (PSA)   . Osteoarthrosis, unspecified whether generalized or localized, unspecified site   . Anxiety state, unspecified   . Rectal mass   . Shortness of breath     due to fluid    Past Surgical History  Procedure Laterality Date  . Inguinal hernia repair  1989    left  . Colonoscopy w/ polypectomy    . Video assisted thoracoscopy (vats)/decortication Right 09/25/2013    Procedure: VIDEO ASSISTED THORACOSCOPY (VATS)/POSSIBLE DECORTICATION;  Surgeon: Melrose Nakayama, MD;  Location: [facility];  Service: Thoracic;  Laterality: Right;  . Pleural effusion drainage Right 09/25/2013    Procedure: DRAINAGE OF PLEURAL EFFUSION;  Surgeon: Melrose Nakayama, MD;  Location: [facility];  Service: Thoracic;  Laterality: Right;    has HYPERLIPIDEMIA; PERIPHERAL NEUROPATHY; HYPERTENSION; GERD; DEGENERATIVE JOINT DISEASE; ELEVATED PROSTATE SPECIFIC ANTIGEN; HISTORY OF ASBESTOS EXPOSURE; Hearing loss, conductive, bilateral; Benign paroxysmal positional vertigo; Pneumonia, viral; Pleural effusion, right; CAP (community acquired pneumonia); Pleural effusion; Pneumonia; Malignant pleural mesothelioma; Superficial phlebitis; Superficial venous thrombosis of right upper extremity; Antineoplastic chemotherapy induced anemia(285.3); Hypoalbuminemia; Fatigue; and Physical deconditioning on his problem list.     is allergic to other.    Medication List       This list is accurate as of: 12/17/13  2:08 PM.   Always use your most recent med list.               aspirin 81 MG tablet  Take 81 mg by mouth daily.     atenolol 25 MG tablet  Commonly known as:  TENORMIN  Take 1 tablet (25 mg total) by mouth daily.     dexamethasone 4 MG tablet  Commonly known as:  DECADRON  Take 4 mg by mouth 2 (two) times daily with a meal. 1 tablet twice daily the day before, the day of, and  the day after chemo     EPINEPHrine 0.3 mg/0.3 mL Soaj injection  Commonly known as:  EPIPEN 2-PAK  Inject 0.3 mLs (0.3 mg total) into the muscle once.     esomeprazole 20 MG capsule  Commonly known as:  NEXIUM  Take 20 mg by mouth daily at 12 noon.     finasteride 5 MG tablet  Commonly known as:  PROSCAR  Take 1 tablet (5 mg total) by mouth as directed. Monday, Wednesday, Friday and Sunday     folic acid 1 MG tablet  Commonly known as:  FOLVITE  Take 1 tablet (1 mg total) by mouth daily.     HYDROcodone-homatropine 5-1.5 MG/5ML syrup  Commonly known as:  HYCODAN  Take 5 mLs by mouth every 6 (six) hours as needed for cough.     ondansetron 8 MG tablet  Commonly known as:  ZOFRAN  Take 1 tablet (8 mg total) by mouth every 8 (eight) hours as needed for nausea or vomiting.     prochlorperazine 10 MG tablet  Commonly known as:  COMPAZINE  Take 1 tablet (10 mg total) by mouth every 6 (six) hours as needed for nausea or vomiting.     terazosin 10 MG capsule  Commonly known as:  HYTRIN  Take 1 capsule (10 mg total) by mouth every evening.     traMADol 50 MG tablet  Commonly known as:  ULTRAM  Take 1 tablet (50 mg total) by mouth every 6 (six) hours as needed.         PHYSICAL EXAMINATION  Blood pressure 137/74, pulse 63, temperature 97.9 F (36.6 C), temperature source Oral, resp. rate 18, height 5\' 8"  (1.727 m), weight 153 lb 8 oz (69.627 kg), SpO2 100.00%.  Physical Exam  Nursing note and vitals reviewed. Constitutional: He is oriented to person, place, and time and well-developed, well-nourished,  and in no distress.  HENT:  Head: Normocephalic and atraumatic.  Eyes: Conjunctivae are normal. Pupils are equal, round, and reactive to light.  Neck: Normal range of motion. Neck supple.  Cardiovascular: Normal rate, regular rhythm and normal heart sounds.  Exam reveals no friction rub.   No murmur heard. Pulmonary/Chest: Effort normal and breath sounds normal. No respiratory distress.  Abdominal: Soft. Bowel sounds are normal. He exhibits no mass. There is no tenderness. There is no rebound.  Musculoskeletal: Normal range of motion. He exhibits no edema and no tenderness.  Neurological: He is alert and oriented to person, place, and time. Gait normal.  Skin: Skin is warm and dry.  No erythema, edema, warmth, or red streaks to either right upper extremity or right lower extremity.  There is a very small, palpable area from a previous superficial clot to right forearm still noted.  This area is nontender; it appears to be a greatly improved.  Psychiatric: Affect normal.    LABORATORY DATA:. CBC  Lab Results  Component Value Date   WBC 3.9* 12/17/2013   RBC 3.79* 12/17/2013   HGB 10.9* 12/17/2013   HCT 33.4* 12/17/2013   PLT 269 12/17/2013   MCV 88.1 12/17/2013   MCH 28.7 12/17/2013   MCHC 32.6 12/17/2013   RDW 18.4* 12/17/2013   LYMPHSABS 1.5 12/17/2013   MONOABS 0.6 12/17/2013   EOSABS 0.1 12/17/2013   BASOSABS 0.0 12/17/2013     CMET  Lab Results  Component Value Date   NA 138 12/17/2013   K 4.4 12/17/2013   CL 101 12/10/2013   CO2 29 12/17/2013  GLUCOSE 102 12/17/2013   BUN 19.4 12/17/2013   CREATININE 1.3 12/17/2013   CALCIUM 9.6 12/17/2013   PROT 6.8 12/17/2013   ALBUMIN 3.0* 12/17/2013   AST 15 12/17/2013   ALT 6 12/17/2013   ALKPHOS 83 12/17/2013   BILITOT 0.21 12/17/2013   GFRNONAA 62* 09/27/2013   GFRAA 72* 09/27/2013    RADIOGRAPHIC STUDIES:       Study Result    CLINICAL DATA: Followup malignant mesothelioma. Chemotherapy and  progress. Right-sided chest and abdominal  pain.  EXAM:  CT CHEST, ABDOMEN, AND PELVIS WITH CONTRAST  TECHNIQUE:  Multidetector CT imaging of the chest, abdomen and pelvis was  performed following the standard protocol during bolus  administration of intravenous contrast.  CONTRAST: 131mL OMNIPAQUE IOHEXOL 300 MG/ML SOLN  COMPARISON: PET-CT on 10/08/2013  FINDINGS:  CT CHEST FINDINGS  Mediastinum/[place]: Mild anterior mediastinal lymphadenopathy  anterior to the ascending aorta is mildly increased since previous  study. Largest lymph node currently measures 11 mm on image 27  compared to 8 mm previously. No other pathologically enlarged lymph  nodes identified.  Other Lymphadenopathy: 9 mm right supraclavicular lymph node on  image 7 remains stable in size.  Lungs: No pulmonary infiltrate or mass identified. Mild right lung  compressive atelectasis noted.  Pleura: Mild diffuse right-sided pleural thickening and nodularity  are stable in appearance. Small right pleural effusion is unchanged  in size with resolution of small pneumothorax component previously  demonstrated.  Vascular/Cardiac: No thoracic aortic aneurysm or other significant  abnormality identified.  Musculoskeletal: No suspicious bone lesions identified.  Other: None.  CT ABDOMEN AND PELVIS FINDINGS  Liver: No mass or other parenchymal abnormality identified.  Gallbladder/Biliary: Unremarkable.  Pancreas: No mass, inflammatory changes, or other parenchymal  abnormality identified.  Spleen: Within normal limits in size and appearance.  Adrenal Glands: No mass identified.  Kidneys/Urinary Tract: No masses identified. No evidence of  hydronephrosis. Bilateral renal cysts remain stable, largest in the  right kidney measuring 9.5 cm.  Lymph Nodes: No pathologically enlarged lymph nodes identified.  Bowel: No evidence of wall thickening, mass, or obstruction.  Pelvic/Reproductive Organs: Mild to moderate enlargement prostate  gland is seen with mass  effect on bladder base.  Vascular: No evidence of abdominal aortic aneurysm.  Musculoskeletal: No suspicious bone lesions identified.  Other: Small bilateral inguinal hernias containing only fat remains  stable.  IMPRESSION:  Stable diffuse right pleural thickening and small right pleural  effusion.  Minimally increased anterior mediastinal lymphadenopathy. Stable 9  mm right supraclavicular lymph node.  No evidence of abdominal or pelvic metastatic disease.  Stable enlarged prostate and small bilateral inguinal hernias  containing only fat.  Electronically Signed  By: Earle Gell M.D.  On: 12/16/2013 14:26     ASSESSMENT/PLAN:    Malignant pleural mesothelioma  Assessment & Plan Patient has completed 3 cycles of cisplatin/Alimta chemotherapy regimen.  Reviewed at restaging CT results with both patient and his family extensively today.  It does appear that this most recent CT reveals stable disease; with only minimally increased mediastinal lymphadenopathy.  Patient's symptoms remained stable as well.  We will plan to proceed with cycle 4 of his cisplatin/Alimta chemotherapy regimen this coming Tuesday, 12/24/2013.  Did confirm with both patient and his family the patient will be taking his dexamethasone as previously directed the day before, day of, and day after his chemotherapy.  The plan is for the patient to complete an additional 3 cycles of his chemotherapy prior to another  restaging scan.   Superficial venous thrombosis of right upper extremity  Assessment & Plan Patient was diagnosed with a right forearm superficial venous thrombosis last week; and has been treating on this superficial thrombosis with both compresses and ibuprofen.  Does appear that his right forearm site has essentially cleared; with only a minimal palpated mass remaining.  Patient has also had a right lower leg superficial thrombosis diagnosed within the past 3-4 weeks as well.  I did site has completely  resolved at this time.  Patient also states that he has been taking a baby aspirin 81 mg on a daily basis as well.   Hypoalbuminemia  Assessment & Plan Patient continues with a decreased albumin level.  Long discussion with both patient and his family regarding the need to push protein in his diet.   Fatigue  Assessment & Plan Chemotherapy-induced fatigue and deconditioning noted.  Patient states he has not been as active as he should.  Long discussion with both patient and his family regarding the need for physical therapy to provide strength/conditioning exercises for the patient.  Patient stated that he would consider an initial physical therapy appointment.  Will place the physical therapy order today.   Physical deconditioning  Assessment & Plan Chemotherapy-induced fatigue and deconditioning noted.  Patient states he has not been as active as he should.  Long discussion with both patient and his family regarding the need for physical therapy to provide strength/conditioning exercises for the patient.  Patient stated that he would consider an initial physical therapy appointment.  Will place the physical therapy order today.   Patient stated understanding of all instructions; and was in agreement with this plan of care. The patient knows to call the clinic with any problems, questions or concerns.   This was a shared visit with Dr. Julien Nordmann today.   Total time spent with patient was 40 minutes;  with greater than 75 percent of that time spent in face to face counseling reviewing his symptoms, extensively reviewing his lab results and CT results , discussion of physical therapy referral, and other treatment options, and coordination of care and follow up.  Disclaimer: This note was dictated with voice recognition software. Similar sounding words can inadvertently be transcribed and may not be corrected upon review.   Drue Second, NP 12/17/2013  ADDENDUM: Hematology/Oncology  Attending: I had a face to face encounter with the patient. I recommended his care plan. This is a very pleasant 80 years old white male with malignant pleural effusion mesothelioma. He is currently undergoing systemic chemotherapy with cisplatin and Alimta status post 3 cycles. The patient is tolerating his treatment fairly well with no significant adverse effects except for mild fatigue and occasional nausea. He had repeat CT scan of the chest, abdomen and pelvis performed recently that showed no significant evidence for disease progression except for slight increase in the mediastinal lymph nodes. I had a lengthy discussion with the patient and his family today about his current disease status and treatment options, The patient is still not interested in considering surgical evaluation. I recommended for him to proceed with 3 more cycles of systemic chemotherapy with cisplatin and Alimta. He was started cycle #4 next week. He would come back for followup visit in 4 weeks with the start of cycle #5. The patient and his family had several questions and I answered them completely to their satisfaction. He was advised to call immediately if he has any concerning symptoms in the interval.  Disclaimer: This  note was dictated with voice recognition software. Similar sounding words can inadvertently be transcribed and may be missed upon review. Eilleen Kempf., MD 12/18/2013

## 2013-12-17 NOTE — Assessment & Plan Note (Signed)
Patient has completed 3 cycles of cisplatin/Alimta chemotherapy regimen.  Reviewed at restaging CT results with both patient and his family extensively today.  It does appear that this most recent CT reveals stable disease; with only minimally increased mediastinal lymphadenopathy.  Patient's symptoms remained stable as well.  We will plan to proceed with cycle 4 of his cisplatin/Alimta chemotherapy regimen this coming Tuesday, 12/24/2013.  Did confirm with both patient and his family the patient will be taking his dexamethasone as previously directed the day before, day of, and day after his chemotherapy.  The plan is for the patient to complete an additional 3 cycles of his chemotherapy prior to another restaging scan.

## 2013-12-17 NOTE — Telephone Encounter (Signed)
, °

## 2013-12-17 NOTE — Assessment & Plan Note (Addendum)
Chemotherapy-induced fatigue and deconditioning noted.  Patient states he has not been as active as he should.  Long discussion with both patient and his family regarding the need for physical therapy to provide strength/conditioning exercises for the patient.  Patient stated that he would consider an initial physical therapy appointment.  Will place the physical therapy order today.

## 2013-12-17 NOTE — Assessment & Plan Note (Signed)
Chemotherapy-induced fatigue and deconditioning noted.  Patient states he has not been as active as he should.  Long discussion with both patient and his family regarding the need for physical therapy to provide strength/conditioning exercises for the patient.  Patient stated that he would consider an initial physical therapy appointment.  Will place the physical therapy order today.

## 2013-12-17 NOTE — Assessment & Plan Note (Signed)
Patient was diagnosed with a right forearm superficial venous thrombosis last week; and has been treating on this superficial thrombosis with both compresses and ibuprofen.  Does appear that his right forearm site has essentially cleared; with only a minimal palpated mass remaining.  Patient has also had a right lower leg superficial thrombosis diagnosed within the past 3-4 weeks as well.  I did site has completely resolved at this time.  Patient also states that he has been taking a baby aspirin 81 mg on a daily basis as well.

## 2013-12-18 ENCOUNTER — Telehealth: Payer: Self-pay | Admitting: *Deleted

## 2013-12-18 ENCOUNTER — Telehealth: Payer: Self-pay | Admitting: Internal Medicine

## 2013-12-18 NOTE — Telephone Encounter (Signed)
Per staff message and POF I have scheduled appts. Advised scheduler of appts and that treatment is 7hrs, need to move up MD visit   . JMW

## 2013-12-18 NOTE — Telephone Encounter (Signed)
Per Selena Lesser, order for outpatient PT was placed into [facility].  This will go to a work que with PT to process.  Informed Anne in scheduling.  SLJ

## 2013-12-24 ENCOUNTER — Ambulatory Visit: Payer: Medicare Other | Admitting: Physician Assistant

## 2013-12-24 ENCOUNTER — Other Ambulatory Visit (HOSPITAL_BASED_OUTPATIENT_CLINIC_OR_DEPARTMENT_OTHER): Payer: Medicare Other

## 2013-12-24 ENCOUNTER — Ambulatory Visit (HOSPITAL_BASED_OUTPATIENT_CLINIC_OR_DEPARTMENT_OTHER): Payer: Medicare Other

## 2013-12-24 ENCOUNTER — Other Ambulatory Visit: Payer: Self-pay | Admitting: Physician Assistant

## 2013-12-24 ENCOUNTER — Telehealth: Payer: Self-pay | Admitting: Internal Medicine

## 2013-12-24 ENCOUNTER — Other Ambulatory Visit: Payer: Medicare Other

## 2013-12-24 VITALS — BP 134/69 | HR 62 | Temp 97.5°F | Resp 18

## 2013-12-24 DIAGNOSIS — Z5111 Encounter for antineoplastic chemotherapy: Secondary | ICD-10-CM

## 2013-12-24 DIAGNOSIS — C384 Malignant neoplasm of pleura: Secondary | ICD-10-CM

## 2013-12-24 DIAGNOSIS — C450 Mesothelioma of pleura: Secondary | ICD-10-CM

## 2013-12-24 DIAGNOSIS — E8809 Other disorders of plasma-protein metabolism, not elsewhere classified: Secondary | ICD-10-CM

## 2013-12-24 LAB — CBC WITH DIFFERENTIAL/PLATELET
BASO%: 0.5 % (ref 0.0–2.0)
BASOS ABS: 0 10*3/uL (ref 0.0–0.1)
EOS ABS: 0 10*3/uL (ref 0.0–0.5)
EOS%: 0.1 % (ref 0.0–7.0)
HCT: 35.3 % — ABNORMAL LOW (ref 38.4–49.9)
HGB: 11.5 g/dL — ABNORMAL LOW (ref 13.0–17.1)
LYMPH#: 1.5 10*3/uL (ref 0.9–3.3)
LYMPH%: 20.3 % (ref 14.0–49.0)
MCH: 28.7 pg (ref 27.2–33.4)
MCHC: 32.6 g/dL (ref 32.0–36.0)
MCV: 88.2 fL (ref 79.3–98.0)
MONO#: 0.7 10*3/uL (ref 0.1–0.9)
MONO%: 10.1 % (ref 0.0–14.0)
NEUT%: 69 % (ref 39.0–75.0)
NEUTROS ABS: 5 10*3/uL (ref 1.5–6.5)
Platelets: 243 10*3/uL (ref 140–400)
RBC: 4.01 10*6/uL — ABNORMAL LOW (ref 4.20–5.82)
RDW: 19.4 % — AB (ref 11.0–14.6)
WBC: 7.2 10*3/uL (ref 4.0–10.3)

## 2013-12-24 LAB — COMPREHENSIVE METABOLIC PANEL (CC13)
ALT: 6 U/L (ref 0–55)
AST: 15 U/L (ref 5–34)
Albumin: 3.2 g/dL — ABNORMAL LOW (ref 3.5–5.0)
Alkaline Phosphatase: 80 U/L (ref 40–150)
Anion Gap: 10 mEq/L (ref 3–11)
BUN: 29.2 mg/dL — AB (ref 7.0–26.0)
CALCIUM: 9.8 mg/dL (ref 8.4–10.4)
CO2: 24 mEq/L (ref 22–29)
Chloride: 103 mEq/L (ref 98–109)
Creatinine: 1.2 mg/dL (ref 0.7–1.3)
GLUCOSE: 112 mg/dL (ref 70–140)
POTASSIUM: 3.6 meq/L (ref 3.5–5.1)
Sodium: 137 mEq/L (ref 136–145)
TOTAL PROTEIN: 7.1 g/dL (ref 6.4–8.3)
Total Bilirubin: 0.25 mg/dL (ref 0.20–1.20)

## 2013-12-24 LAB — MAGNESIUM (CC13): MAGNESIUM: 1.9 mg/dL (ref 1.5–2.5)

## 2013-12-24 MED ORDER — CYANOCOBALAMIN 1000 MCG/ML IJ SOLN
INTRAMUSCULAR | Status: AC
  Filled 2013-12-24: qty 1

## 2013-12-24 MED ORDER — FOSAPREPITANT DIMEGLUMINE INJECTION 150 MG
150.0000 mg | Freq: Once | INTRAVENOUS | Status: AC
  Administered 2013-12-24: 150 mg via INTRAVENOUS
  Filled 2013-12-24: qty 5

## 2013-12-24 MED ORDER — DEXAMETHASONE SODIUM PHOSPHATE 20 MG/5ML IJ SOLN
INTRAMUSCULAR | Status: AC
  Filled 2013-12-24: qty 5

## 2013-12-24 MED ORDER — CYANOCOBALAMIN 1000 MCG/ML IJ SOLN
1000.0000 ug | Freq: Once | INTRAMUSCULAR | Status: AC
  Administered 2013-12-24: 1000 ug via INTRAMUSCULAR

## 2013-12-24 MED ORDER — DEXAMETHASONE SODIUM PHOSPHATE 20 MG/5ML IJ SOLN
12.0000 mg | Freq: Once | INTRAMUSCULAR | Status: AC
  Administered 2013-12-24: 12 mg via INTRAVENOUS

## 2013-12-24 MED ORDER — POTASSIUM CHLORIDE 2 MEQ/ML IV SOLN
Freq: Once | INTRAVENOUS | Status: AC
  Administered 2013-12-24: 11:00:00 via INTRAVENOUS
  Filled 2013-12-24: qty 10

## 2013-12-24 MED ORDER — SODIUM CHLORIDE 0.9 % IV SOLN
75.0000 mg/m2 | Freq: Once | INTRAVENOUS | Status: AC
  Administered 2013-12-24: 143 mg via INTRAVENOUS
  Filled 2013-12-24: qty 143

## 2013-12-24 MED ORDER — SODIUM CHLORIDE 0.9 % IV SOLN
Freq: Once | INTRAVENOUS | Status: AC
  Administered 2013-12-24: 13:00:00 via INTRAVENOUS

## 2013-12-24 MED ORDER — PALONOSETRON HCL INJECTION 0.25 MG/5ML
INTRAVENOUS | Status: AC
  Filled 2013-12-24: qty 5

## 2013-12-24 MED ORDER — PALONOSETRON HCL INJECTION 0.25 MG/5ML
0.2500 mg | Freq: Once | INTRAVENOUS | Status: AC
  Administered 2013-12-24: 0.25 mg via INTRAVENOUS

## 2013-12-24 MED ORDER — SODIUM CHLORIDE 0.9 % IV SOLN
500.0000 mg/m2 | Freq: Once | INTRAVENOUS | Status: AC
  Administered 2013-12-24: 950 mg via INTRAVENOUS
  Filled 2013-12-24: qty 38

## 2013-12-24 NOTE — Telephone Encounter (Signed)
gv and printed apt sched and avs for pt for SEpt...sed added tx.

## 2013-12-24 NOTE — Patient Instructions (Signed)
[facility] Discharge Instructions for Patients Receiving Chemotherapy  Today you received the following chemotherapy agents: Cisplatin, Alimta   To help prevent nausea and vomiting after your treatment, we encourage you to take your nausea medication as prescribed.    If you develop nausea and vomiting that is not controlled by your nausea medication, call the clinic.   BELOW ARE SYMPTOMS THAT SHOULD BE REPORTED IMMEDIATELY:  *FEVER GREATER THAN 100.5 F  *CHILLS WITH OR WITHOUT FEVER  NAUSEA AND VOMITING THAT IS NOT CONTROLLED WITH YOUR NAUSEA MEDICATION  *UNUSUAL SHORTNESS OF BREATH  *UNUSUAL BRUISING OR BLEEDING  TENDERNESS IN MOUTH AND THROAT WITH OR WITHOUT PRESENCE OF ULCERS  *URINARY PROBLEMS  *BOWEL PROBLEMS  UNUSUAL RASH Items with * indicate a potential emergency and should be followed up as soon as possible.  Feel free to call the clinic you have any questions or concerns. The clinic phone number is [phone].

## 2013-12-31 ENCOUNTER — Other Ambulatory Visit (HOSPITAL_BASED_OUTPATIENT_CLINIC_OR_DEPARTMENT_OTHER): Payer: Medicare Other

## 2013-12-31 DIAGNOSIS — C384 Malignant neoplasm of pleura: Secondary | ICD-10-CM

## 2013-12-31 DIAGNOSIS — C450 Mesothelioma of pleura: Secondary | ICD-10-CM

## 2013-12-31 LAB — COMPREHENSIVE METABOLIC PANEL (CC13)
ALT: 10 U/L (ref 0–55)
AST: 15 U/L (ref 5–34)
Albumin: 2.9 g/dL — ABNORMAL LOW (ref 3.5–5.0)
Alkaline Phosphatase: 68 U/L (ref 40–150)
Anion Gap: 9 mEq/L (ref 3–11)
BILIRUBIN TOTAL: 0.37 mg/dL (ref 0.20–1.20)
BUN: 32.9 mg/dL — ABNORMAL HIGH (ref 7.0–26.0)
CALCIUM: 9 mg/dL (ref 8.4–10.4)
CHLORIDE: 104 meq/L (ref 98–109)
CO2: 26 mEq/L (ref 22–29)
Creatinine: 1.2 mg/dL (ref 0.7–1.3)
Glucose: 98 mg/dl (ref 70–140)
Potassium: 4 mEq/L (ref 3.5–5.1)
Sodium: 139 mEq/L (ref 136–145)
Total Protein: 6.5 g/dL (ref 6.4–8.3)

## 2013-12-31 LAB — CBC WITH DIFFERENTIAL/PLATELET
BASO%: 0.5 % (ref 0.0–2.0)
Basophils Absolute: 0 10*3/uL (ref 0.0–0.1)
EOS%: 0.8 % (ref 0.0–7.0)
Eosinophils Absolute: 0 10*3/uL (ref 0.0–0.5)
HCT: 35.4 % — ABNORMAL LOW (ref 38.4–49.9)
HGB: 11.3 g/dL — ABNORMAL LOW (ref 13.0–17.1)
LYMPH%: 30.3 % (ref 14.0–49.0)
MCH: 28.4 pg (ref 27.2–33.4)
MCHC: 32 g/dL (ref 32.0–36.0)
MCV: 88.6 fL (ref 79.3–98.0)
MONO#: 0.2 10*3/uL (ref 0.1–0.9)
MONO%: 3.6 % (ref 0.0–14.0)
NEUT#: 3 10*3/uL (ref 1.5–6.5)
NEUT%: 64.8 % (ref 39.0–75.0)
PLATELETS: 123 10*3/uL — AB (ref 140–400)
RBC: 3.99 10*6/uL — ABNORMAL LOW (ref 4.20–5.82)
RDW: 19.2 % — ABNORMAL HIGH (ref 11.0–14.6)
WBC: 4.7 10*3/uL (ref 4.0–10.3)
lymph#: 1.4 10*3/uL (ref 0.9–3.3)
nRBC: 0 % (ref 0–0)

## 2013-12-31 LAB — MAGNESIUM (CC13): Magnesium: 1.6 mg/dl (ref 1.5–2.5)

## 2014-01-07 ENCOUNTER — Other Ambulatory Visit (HOSPITAL_BASED_OUTPATIENT_CLINIC_OR_DEPARTMENT_OTHER): Payer: Medicare Other

## 2014-01-07 DIAGNOSIS — C450 Mesothelioma of pleura: Secondary | ICD-10-CM

## 2014-01-07 DIAGNOSIS — C384 Malignant neoplasm of pleura: Secondary | ICD-10-CM

## 2014-01-07 LAB — CBC WITH DIFFERENTIAL/PLATELET
BASO%: 0.3 % (ref 0.0–2.0)
BASOS ABS: 0 10*3/uL (ref 0.0–0.1)
EOS ABS: 0 10*3/uL (ref 0.0–0.5)
EOS%: 1.1 % (ref 0.0–7.0)
HEMATOCRIT: 33.8 % — AB (ref 38.4–49.9)
HEMOGLOBIN: 10.9 g/dL — AB (ref 13.0–17.1)
LYMPH%: 38 % (ref 14.0–49.0)
MCH: 28.8 pg (ref 27.2–33.4)
MCHC: 32.3 g/dL (ref 32.0–36.0)
MCV: 89.1 fL (ref 79.3–98.0)
MONO#: 0.4 10*3/uL (ref 0.1–0.9)
MONO%: 9.3 % (ref 0.0–14.0)
NEUT%: 51.3 % (ref 39.0–75.0)
NEUTROS ABS: 2.3 10*3/uL (ref 1.5–6.5)
PLATELETS: 105 10*3/uL — AB (ref 140–400)
RBC: 3.79 10*6/uL — ABNORMAL LOW (ref 4.20–5.82)
RDW: 19.9 % — ABNORMAL HIGH (ref 11.0–14.6)
WBC: 4.4 10*3/uL (ref 4.0–10.3)
lymph#: 1.7 10*3/uL (ref 0.9–3.3)

## 2014-01-07 LAB — COMPREHENSIVE METABOLIC PANEL (CC13)
ALBUMIN: 3 g/dL — AB (ref 3.5–5.0)
ALT: <6 U/L (ref 0–55)
ANION GAP: 9 meq/L (ref 3–11)
AST: 15 U/L (ref 5–34)
Alkaline Phosphatase: 81 U/L (ref 40–150)
BUN: 24.6 mg/dL (ref 7.0–26.0)
CO2: 23 meq/L (ref 22–29)
Calcium: 9.3 mg/dL (ref 8.4–10.4)
Chloride: 106 mEq/L (ref 98–109)
Creatinine: 1.3 mg/dL (ref 0.7–1.3)
GLUCOSE: 110 mg/dL (ref 70–140)
Potassium: 4.1 mEq/L (ref 3.5–5.1)
Sodium: 138 mEq/L (ref 136–145)
Total Bilirubin: 0.35 mg/dL (ref 0.20–1.20)
Total Protein: 6.8 g/dL (ref 6.4–8.3)

## 2014-01-07 LAB — MAGNESIUM (CC13): MAGNESIUM: 1.8 mg/dL (ref 1.5–2.5)

## 2014-01-13 ENCOUNTER — Ambulatory Visit (HOSPITAL_BASED_OUTPATIENT_CLINIC_OR_DEPARTMENT_OTHER): Payer: Medicare Other | Admitting: Physician Assistant

## 2014-01-13 ENCOUNTER — Telehealth: Payer: Self-pay | Admitting: Internal Medicine

## 2014-01-13 ENCOUNTER — Other Ambulatory Visit (HOSPITAL_BASED_OUTPATIENT_CLINIC_OR_DEPARTMENT_OTHER): Payer: Medicare Other

## 2014-01-13 ENCOUNTER — Encounter: Payer: Self-pay | Admitting: Physician Assistant

## 2014-01-13 VITALS — BP 163/80 | HR 60 | Temp 97.0°F | Resp 18 | Ht 68.0 in | Wt 152.5 lb

## 2014-01-13 DIAGNOSIS — C450 Mesothelioma of pleura: Secondary | ICD-10-CM

## 2014-01-13 DIAGNOSIS — Z23 Encounter for immunization: Secondary | ICD-10-CM

## 2014-01-13 DIAGNOSIS — C384 Malignant neoplasm of pleura: Secondary | ICD-10-CM

## 2014-01-13 LAB — CBC WITH DIFFERENTIAL/PLATELET
BASO%: 0.5 % (ref 0.0–2.0)
BASOS ABS: 0 10*3/uL (ref 0.0–0.1)
EOS%: 1.9 % (ref 0.0–7.0)
Eosinophils Absolute: 0 10*3/uL (ref 0.0–0.5)
HCT: 34.7 % — ABNORMAL LOW (ref 38.4–49.9)
HEMOGLOBIN: 11.1 g/dL — AB (ref 13.0–17.1)
LYMPH#: 1.1 10*3/uL (ref 0.9–3.3)
LYMPH%: 44.1 % (ref 14.0–49.0)
MCH: 29.1 pg (ref 27.2–33.4)
MCHC: 31.9 g/dL — ABNORMAL LOW (ref 32.0–36.0)
MCV: 91.1 fL (ref 79.3–98.0)
MONO#: 0.4 10*3/uL (ref 0.1–0.9)
MONO%: 14.3 % — ABNORMAL HIGH (ref 0.0–14.0)
NEUT#: 1 10*3/uL — ABNORMAL LOW (ref 1.5–6.5)
NEUT%: 39.2 % (ref 39.0–75.0)
Platelets: 246 10*3/uL (ref 140–400)
RBC: 3.81 10*6/uL — ABNORMAL LOW (ref 4.20–5.82)
RDW: 21.1 % — AB (ref 11.0–14.6)
WBC: 2.5 10*3/uL — AB (ref 4.0–10.3)

## 2014-01-13 LAB — COMPREHENSIVE METABOLIC PANEL (CC13)
ALBUMIN: 3.1 g/dL — AB (ref 3.5–5.0)
ALT: <6 U/L (ref 0–55)
ANION GAP: 10 meq/L (ref 3–11)
AST: 14 U/L (ref 5–34)
Alkaline Phosphatase: 88 U/L (ref 40–150)
BUN: 20.2 mg/dL (ref 7.0–26.0)
CALCIUM: 9.5 mg/dL (ref 8.4–10.4)
CHLORIDE: 105 meq/L (ref 98–109)
CO2: 24 meq/L (ref 22–29)
Creatinine: 1.3 mg/dL (ref 0.7–1.3)
Glucose: 99 mg/dl (ref 70–140)
POTASSIUM: 3.9 meq/L (ref 3.5–5.1)
Sodium: 140 mEq/L (ref 136–145)
Total Bilirubin: 0.24 mg/dL (ref 0.20–1.20)
Total Protein: 7.1 g/dL (ref 6.4–8.3)

## 2014-01-13 LAB — MAGNESIUM (CC13): Magnesium: 1.9 mg/dl (ref 1.5–2.5)

## 2014-01-13 MED ORDER — INFLUENZA VAC SPLIT QUAD 0.5 ML IM SUSY
0.5000 mL | PREFILLED_SYRINGE | Freq: Once | INTRAMUSCULAR | Status: AC
  Administered 2014-01-13: 0.5 mL via INTRAMUSCULAR
  Filled 2014-01-13: qty 0.5

## 2014-01-13 NOTE — Telephone Encounter (Signed)
gv adn printed appts ched adna vs for pt for Sept adn OCT....sed added tx.

## 2014-01-13 NOTE — Progress Notes (Addendum)
[place] [phone]   [phone]  OFFICE PROGRESS NOTE  Sean Man, MD [place] [address]  DIAGNOSIS: Malignant pleural mesothelioma  Primary site: Pleural Mesothelioma (Right)  Staging method: AJCC 7th Edition  Clinical: Stage II (T2, N0, M0) signed by Sean Bears, MD on 10/09/2013 12:58 PM  Summary: Stage II (T2, N0, M0)   PRIOR THERAPY: Status post right VATS with drainage of right pleural effusion and pleural biopsies on 09/25/2013 the care of Dr. Roxan Mitchell   CURRENT THERAPY: Systemic chemotherapy with cisplatin 75 mg/m2 and Alimta 500 mg/m2 given every 3 weeks. Status post 4 cycles.  INTERVAL HISTORY: Sean Mitchell. 80 y.o. male returns to the clinic today for followup visit accompanied by his wife. He reports decreased taste and decreased appetite overall. He complains of continued pain underneath his right shoulder blade. This pain can be as "sharp as a knife sticking into me" or a constant discomfort. The pain tends to be worse when he is lying flat. He complains of dry cough not associated with fever or chills. He also has had some episodes of dry heaves, sometimes awakening in the middle of the night with an episode of dry heaves. Is unclear whether this is delayed nausea related to the chemotherapy versus anticipatory type nausea. His wife reports that he is sleeping a great deal of the day. Sean Mitchell differs with this assessment. He voiced no other specific complaints today.  He denied having any significant weight loss or night sweats. He has no fever or chills. He denied having any significant chest pain but continues to have shortness of breath with exertion was no cough or hemoptysis. He is here today prior to the start cycle #5 of his chemotherapy.  MEDICAL HISTORY: Past Medical History  Diagnosis Date  . Personal history of contact with and (suspected) exposure to asbestos   . Unspecified  essential hypertension   . Other and unspecified hyperlipidemia   . Esophageal reflux   . Diverticulosis of colon (without mention of hemorrhage)   . Elevated prostate specific antigen (PSA)   . Osteoarthrosis, unspecified whether generalized or localized, unspecified site   . Anxiety state, unspecified   . Rectal mass   . Shortness of breath     due to fluid    ALLERGIES:  is allergic to other.  MEDICATIONS:  Current Outpatient Prescriptions  Medication Sig Dispense Refill  . aspirin 81 MG tablet Take 81 mg by mouth daily.        Marland Kitchen atenolol (TENORMIN) 25 MG tablet Take 1 tablet (25 mg total) by mouth daily.  100 tablet  3  . dexamethasone (DECADRON) 4 MG tablet Take 4 mg by mouth 2 (two) times daily with a meal. 1 tablet twice daily the day before, the day of, and the day after chemo      . esomeprazole (NEXIUM) 20 MG capsule Take 20 mg by mouth daily at 12 noon.      . finasteride (PROSCAR) 5 MG tablet Take 1 tablet (5 mg total) by mouth as directed. Monday, Wednesday, Friday and Sunday  825 tablet  3  . folic acid (FOLVITE) 1 MG tablet Take 1 tablet (1 mg total) by mouth daily.  30 tablet  4  . prochlorperazine (COMPAZINE) 10 MG tablet Take 1 tablet (10 mg total) by mouth every 6 (six) hours as needed for nausea or vomiting.  60 tablet  1  . terazosin (  HYTRIN) 10 MG capsule Take 1 capsule (10 mg total) by mouth every evening.  100 capsule  3  . traMADol (ULTRAM) 50 MG tablet Take 1 tablet (50 mg total) by mouth every 6 (six) hours as needed.  30 tablet  0  . EPINEPHrine (EPIPEN 2-PAK) 0.3 mg/0.3 mL DEVI Inject 0.3 mLs (0.3 mg total) into the muscle once.  1 Device  1  . HYDROcodone-homatropine (HYCODAN) 5-1.5 MG/5ML syrup Take 5 mLs by mouth every 6 (six) hours as needed for cough.      . ondansetron (ZOFRAN) 8 MG tablet Take 1 tablet (8 mg total) by mouth every 8 (eight) hours as needed for nausea or vomiting.  30 tablet  1   No current facility-administered medications for this  visit.    SURGICAL HISTORY:  Past Surgical History  Procedure Laterality Date  . Inguinal hernia repair  1989    left  . Colonoscopy w/ polypectomy    . Video assisted thoracoscopy (vats)/decortication Right 09/25/2013    Procedure: VIDEO ASSISTED THORACOSCOPY (VATS)/POSSIBLE DECORTICATION;  Surgeon: Melrose Nakayama, MD;  Location: [facility];  Service: Thoracic;  Laterality: Right;  . Pleural effusion drainage Right 09/25/2013    Procedure: DRAINAGE OF PLEURAL EFFUSION;  Surgeon: Melrose Nakayama, MD;  Location: [facility];  Service: Thoracic;  Laterality: Right;    REVIEW OF SYSTEMS:  Constitutional: positive for anorexia, fatigue and weight loss Eyes: negative Ears, nose, mouth, throat, and face: negative Respiratory: positive for dyspnea on exertion Cardiovascular: negative Gastrointestinal: positive for nausea and Dry heaves Genitourinary:negative Integument/breast: negative Hematologic/lymphatic: negative Musculoskeletal:negative Neurological: negative Behavioral/Psych: negative Endocrine: negative Allergic/Immunologic: negative   PHYSICAL EXAMINATION: General appearance: alert, cooperative, fatigued and no distress Head: Normocephalic, without obvious abnormality, atraumatic Neck: no adenopathy, no JVD, supple, symmetrical, trachea midline and thyroid not enlarged, symmetric, no tenderness/mass/nodules Lymph nodes: Cervical, supraclavicular, and axillary nodes normal. Resp: clear to auscultation bilaterally Back: symmetric, no curvature. ROM normal. No CVA tenderness. Cardio: regular rate and rhythm, S1, S2 normal, no murmur, click, rub or gallop GI: soft, non-tender; bowel sounds normal; no masses,  no organomegaly Extremities: extremities normal, atraumatic, no cyanosis or edema Neurologic: Alert and oriented X 3, normal strength and tone. Normal symmetric reflexes. Normal coordination and gait  ECOG PERFORMANCE STATUS: 1 - Symptomatic but completely ambulatory  Blood  pressure 163/80, pulse 60, temperature 97 F (36.1 C), temperature source Oral, resp. rate 18, height 5\' 8"  (1.727 m), weight 152 lb 8 oz (69.174 kg), SpO2 96.00%.  LABORATORY DATA: Lab Results  Component Value Date   WBC 2.5* 01/13/2014   HGB 11.1* 01/13/2014   HCT 34.7* 01/13/2014   MCV 91.1 01/13/2014   PLT 246 01/13/2014      Chemistry      Component Value Date/Time   NA 140 01/13/2014 1140   NA 136 12/10/2013 1045   K 3.9 01/13/2014 1140   K 4.1 12/10/2013 1045   CL 101 12/10/2013 1045   CO2 24 01/13/2014 1140   CO2 28 12/10/2013 1045   BUN 20.2 01/13/2014 1140   BUN 21 12/10/2013 1045   CREATININE 1.3 01/13/2014 1140   CREATININE 1.32 12/10/2013 1045      Component Value Date/Time   CALCIUM 9.5 01/13/2014 1140   CALCIUM 8.6 12/10/2013 1045   ALKPHOS 88 01/13/2014 1140   ALKPHOS 73 12/10/2013 1045   AST 14 01/13/2014 1140   AST 14 12/10/2013 1045   ALT <6 01/13/2014 1140   ALT <8 12/10/2013 1045  BILITOT 0.24 01/13/2014 1140   BILITOT 0.3 12/10/2013 1045       RADIOGRAPHIC STUDIES:  ASSESSMENT AND PLAN: This is a very pleasant 80 years old white male recently diagnosed with malignant pleural mesothelioma involving the right lung and currently undergoing systemic chemotherapy with cisplatin and Alimta status post 4 cycles. Overall he is tolerating the chemotherapy relatively well with the exception of nausea, dry heaves and fatigue. His total white count was 2.5 with an ANC of 1.0. The patient was discussed with and also seen by Dr. Julien Nordmann. We will reschedule the start of cycle #5 to begin 01/20/2014 due to the neutropenia. Neutropenic precautions were reviewed in both the patient and his wife voiced understanding. We will check another CBC when he returns in one week to start chemotherapy. If his counts are improved but are still slightly neutropenic we may consider proceeding with chemotherapy as scheduled however adding Neulasta for neutrophil support. He may continue the dexamethasone for  an additional day or 2 after chemotherapy if needed for nausea. We'll plan on having him return in 4 weeks for another symptom management visit prior to the start of cycle #6. He will continue with weekly labs as scheduled in the interim.  He was advised to call me immediately if he has any concerning symptoms in the interval. The patient voices understanding of current disease status and treatment options and is in agreement with the current care plan.  All questions were answered. The patient knows to call the clinic with any problems, questions or concerns. We can certainly see the patient much sooner if necessary.  Disclaimer: This note was dictated with voice recognition software. Similar sounding words can inadvertently be transcribed and may not be corrected upon review.  Carlton Adam, PA-C 01/13/2014  ADDENDUM: Hematology/Oncology Attending: I had a face to face encounter with the patient today. I recommended his care plan. This is a very pleasant 80 years old white male with history of pleural malignant mesothelioma currently undergoing systemic chemotherapy with cisplatin and Alimta status post 4 cycles. He is tolerating his treatment fairly well except for increasing fatigue. His wife is also concerned about his fatigue and taking a lot of naps. The patient also has occasional nausea which occurs sporadically even weeks after his chemotherapy. He was advised to take his nausea medication as needed. He denied having any significant weight loss or night sweats. His absolute neutrophil count is low today. I recommended for the patient to delay the start of cycle #5 x 1 week to give her more time for recovery of his white blood count. He is expected to start cycle #5 on 01/21/2014. He would come back for followup visit in 4 weeks with the start of cycle #6. He was advised to call immediately if he has any concerning symptoms in the interval.  Disclaimer: This note was dictated with  voice recognition software. Similar sounding words can inadvertently be transcribed and may be missed upon review. Eilleen Kempf., MD 01/13/2014

## 2014-01-13 NOTE — Patient Instructions (Signed)
Your chemotherapy has been rescheduled to next week do to your low absolute neutrophil count. Be sure to call our office if he develops fever of 100.5 or greater. Return in one week with repeat labs and to proceed with chemotherapy Return in 4 weeks for another symptom management visit prior to the start of your next scheduled cycle of chemotherapy

## 2014-01-14 ENCOUNTER — Ambulatory Visit: Payer: Medicare Other

## 2014-01-20 ENCOUNTER — Telehealth: Payer: Self-pay | Admitting: *Deleted

## 2014-01-20 ENCOUNTER — Other Ambulatory Visit (HOSPITAL_BASED_OUTPATIENT_CLINIC_OR_DEPARTMENT_OTHER): Payer: Medicare Other

## 2014-01-20 DIAGNOSIS — C384 Malignant neoplasm of pleura: Secondary | ICD-10-CM

## 2014-01-20 DIAGNOSIS — C450 Mesothelioma of pleura: Secondary | ICD-10-CM

## 2014-01-20 LAB — COMPREHENSIVE METABOLIC PANEL (CC13)
ALBUMIN: 3.1 g/dL — AB (ref 3.5–5.0)
ALK PHOS: 92 U/L (ref 40–150)
AST: 14 U/L (ref 5–34)
Anion Gap: 11 mEq/L (ref 3–11)
BILIRUBIN TOTAL: 0.24 mg/dL (ref 0.20–1.20)
BUN: 19.8 mg/dL (ref 7.0–26.0)
CO2: 24 mEq/L (ref 22–29)
Calcium: 9.6 mg/dL (ref 8.4–10.4)
Chloride: 104 mEq/L (ref 98–109)
Creatinine: 1.4 mg/dL — ABNORMAL HIGH (ref 0.7–1.3)
GLUCOSE: 97 mg/dL (ref 70–140)
POTASSIUM: 4.3 meq/L (ref 3.5–5.1)
Sodium: 139 mEq/L (ref 136–145)
Total Protein: 7.2 g/dL (ref 6.4–8.3)

## 2014-01-20 LAB — CBC WITH DIFFERENTIAL/PLATELET
BASO%: 0.5 % (ref 0.0–2.0)
Basophils Absolute: 0 10*3/uL (ref 0.0–0.1)
EOS%: 0.7 % (ref 0.0–7.0)
Eosinophils Absolute: 0 10*3/uL (ref 0.0–0.5)
HCT: 36.9 % — ABNORMAL LOW (ref 38.4–49.9)
HGB: 11.8 g/dL — ABNORMAL LOW (ref 13.0–17.1)
LYMPH%: 24.5 % (ref 14.0–49.0)
MCH: 29 pg (ref 27.2–33.4)
MCHC: 31.9 g/dL — AB (ref 32.0–36.0)
MCV: 90.7 fL (ref 79.3–98.0)
MONO#: 0.4 10*3/uL (ref 0.1–0.9)
MONO%: 7 % (ref 0.0–14.0)
NEUT%: 67.3 % (ref 39.0–75.0)
NEUTROS ABS: 3.6 10*3/uL (ref 1.5–6.5)
PLATELETS: 237 10*3/uL (ref 140–400)
RBC: 4.07 10*6/uL — AB (ref 4.20–5.82)
RDW: 21.2 % — AB (ref 11.0–14.6)
WBC: 5.3 10*3/uL (ref 4.0–10.3)
lymph#: 1.3 10*3/uL (ref 0.9–3.3)

## 2014-01-20 LAB — MAGNESIUM (CC13): MAGNESIUM: 2 mg/dL (ref 1.5–2.5)

## 2014-01-20 NOTE — Telephone Encounter (Signed)
Pt in the lobby waiting for CBC results.  Spoke with pt, gave copy of lab results.  WBC and ANC are WNL.  Pt is aware.

## 2014-01-21 ENCOUNTER — Other Ambulatory Visit: Payer: Medicare Other

## 2014-01-21 ENCOUNTER — Ambulatory Visit (HOSPITAL_BASED_OUTPATIENT_CLINIC_OR_DEPARTMENT_OTHER): Payer: Medicare Other

## 2014-01-21 VITALS — BP 131/69 | HR 64 | Temp 97.7°F | Resp 18

## 2014-01-21 DIAGNOSIS — C450 Mesothelioma of pleura: Secondary | ICD-10-CM

## 2014-01-21 DIAGNOSIS — C384 Malignant neoplasm of pleura: Secondary | ICD-10-CM

## 2014-01-21 DIAGNOSIS — Z5111 Encounter for antineoplastic chemotherapy: Secondary | ICD-10-CM

## 2014-01-21 MED ORDER — DEXAMETHASONE SODIUM PHOSPHATE 20 MG/5ML IJ SOLN
INTRAMUSCULAR | Status: AC
  Filled 2014-01-21: qty 5

## 2014-01-21 MED ORDER — POTASSIUM CHLORIDE 2 MEQ/ML IV SOLN
Freq: Once | INTRAVENOUS | Status: AC
  Administered 2014-01-21: 11:00:00 via INTRAVENOUS
  Filled 2014-01-21: qty 10

## 2014-01-21 MED ORDER — PEMETREXED DISODIUM CHEMO INJECTION 500 MG
500.0000 mg/m2 | Freq: Once | INTRAVENOUS | Status: AC
  Administered 2014-01-21: 950 mg via INTRAVENOUS
  Filled 2014-01-21: qty 38

## 2014-01-21 MED ORDER — DEXAMETHASONE SODIUM PHOSPHATE 20 MG/5ML IJ SOLN
12.0000 mg | Freq: Once | INTRAMUSCULAR | Status: AC
  Administered 2014-01-21: 12 mg via INTRAVENOUS

## 2014-01-21 MED ORDER — PALONOSETRON HCL INJECTION 0.25 MG/5ML
INTRAVENOUS | Status: AC
  Filled 2014-01-21: qty 5

## 2014-01-21 MED ORDER — PALONOSETRON HCL INJECTION 0.25 MG/5ML
0.2500 mg | Freq: Once | INTRAVENOUS | Status: AC
  Administered 2014-01-21: 0.25 mg via INTRAVENOUS

## 2014-01-21 MED ORDER — SODIUM CHLORIDE 0.9 % IV SOLN
150.0000 mg | Freq: Once | INTRAVENOUS | Status: AC
  Administered 2014-01-21: 150 mg via INTRAVENOUS
  Filled 2014-01-21: qty 5

## 2014-01-21 MED ORDER — SODIUM CHLORIDE 0.9 % IV SOLN
75.0000 mg/m2 | Freq: Once | INTRAVENOUS | Status: AC
  Administered 2014-01-21: 143 mg via INTRAVENOUS
  Filled 2014-01-21: qty 143

## 2014-01-21 NOTE — Patient Instructions (Signed)
Cisplatin injection What is this medicine? CISPLATIN (SIS pla tin) is a chemotherapy drug. It targets fast dividing cells, like cancer cells, and causes these cells to die. This medicine is used to treat many types of cancer like bladder, ovarian, and testicular cancers. This medicine may be used for other purposes; ask your health care provider or pharmacist if you have questions. COMMON BRAND NAME(S): Platinol, Platinol -AQ What should I tell my health care provider before I take this medicine? They need to know if you have any of these conditions: -blood disorders -hearing problems -kidney disease -recent or ongoing radiation therapy -an unusual or allergic reaction to cisplatin, carboplatin, other chemotherapy, other medicines, foods, dyes, or preservatives -pregnant or trying to get pregnant -breast-feeding How should I use this medicine? This drug is given as an infusion into a vein. It is administered in a hospital or clinic by a specially trained health care professional. Talk to your pediatrician regarding the use of this medicine in children. Special care may be needed. Overdosage: If you think you have taken too much of this medicine contact a poison control center or emergency room at once. NOTE: This medicine is only for you. Do not share this medicine with others. What if I miss a dose? It is important not to miss a dose. Call your doctor or health care professional if you are unable to keep an appointment. What may interact with this medicine? -dofetilide -foscarnet -medicines for seizures -medicines to increase blood counts like filgrastim, pegfilgrastim, sargramostim -probenecid -pyridoxine used with altretamine -rituximab -some antibiotics like amikacin, gentamicin, neomycin, polymyxin B, streptomycin, tobramycin -sulfinpyrazone -vaccines -zalcitabine Talk to your doctor or health care professional before taking any of these  medicines: -acetaminophen -aspirin -ibuprofen -ketoprofen -naproxen This list may not describe all possible interactions. Give your health care provider a list of all the medicines, herbs, non-prescription drugs, or dietary supplements you use. Also tell them if you smoke, drink alcohol, or use illegal drugs. Some items may interact with your medicine. What should I watch for while using this medicine? Your condition will be monitored carefully while you are receiving this medicine. You will need important blood work done while you are taking this medicine. This drug may make you feel generally unwell. This is not uncommon, as chemotherapy can affect healthy cells as well as cancer cells. Report any side effects. Continue your course of treatment even though you feel ill unless your doctor tells you to stop. In some cases, you may be given additional medicines to help with side effects. Follow all directions for their use. Call your doctor or health care professional for advice if you get a fever, chills or sore throat, or other symptoms of a cold or flu. Do not treat yourself. This drug decreases your body's ability to fight infections. Try to avoid being around people who are sick. This medicine may increase your risk to bruise or bleed. Call your doctor or health care professional if you notice any unusual bleeding. Be careful brushing and flossing your teeth or using a toothpick because you may get an infection or bleed more easily. If you have any dental work done, tell your dentist you are receiving this medicine. Avoid taking products that contain aspirin, acetaminophen, ibuprofen, naproxen, or ketoprofen unless instructed by your doctor. These medicines may hide a fever. Do not become pregnant while taking this medicine. Women should inform their doctor if they wish to become pregnant or think they might be pregnant. There is a   potential for serious side effects to an unborn child. Talk to  your health care professional or pharmacist for more information. Do not breast-feed an infant while taking this medicine. Drink fluids as directed while you are taking this medicine. This will help protect your kidneys. Call your doctor or health care professional if you get diarrhea. Do not treat yourself. What side effects may I notice from receiving this medicine? Side effects that you should report to your doctor or health care professional as soon as possible: -allergic reactions like skin rash, itching or hives, swelling of the face, lips, or tongue -signs of infection - fever or chills, cough, sore throat, pain or difficulty passing urine -signs of decreased platelets or bleeding - bruising, pinpoint red spots on the skin, black, tarry stools, nosebleeds -signs of decreased red blood cells - unusually weak or tired, fainting spells, lightheadedness -breathing problems -changes in hearing -gout pain -low blood counts - This drug may decrease the number of white blood cells, red blood cells and platelets. You may be at increased risk for infections and bleeding. -nausea and vomiting -pain, swelling, redness or irritation at the injection site -pain, tingling, numbness in the hands or feet -problems with balance, movement -trouble passing urine or change in the amount of urine Side effects that usually do not require medical attention (report to your doctor or health care professional if they continue or are bothersome): -changes in vision -loss of appetite -metallic taste in the mouth or changes in taste This list may not describe all possible side effects. Call your doctor for medical advice about side effects. You may report side effects to FDA at 1-800-FDA-1088. Where should I keep my medicine? This drug is given in a hospital or clinic and will not be stored at home. NOTE: This sheet is a summary. It may not cover all possible information. If you have questions about this medicine,  talk to your doctor, pharmacist, or health care provider.  2015, [employer]vier/Gold Standard. (2007-07-24 14:40:54) Pemetrexed injection What is this medicine? PEMETREXED (PEM e TREX ed) is a chemotherapy drug. This medicine affects cells that are rapidly growing, such as cancer cells and cells in your mouth and stomach. It is usually used to treat lung cancers like non-small cell lung cancer and mesothelioma. It may also be used to treat other cancers. This medicine may be used for other purposes; ask your health care provider or pharmacist if you have questions. COMMON BRAND NAME(S): Alimta What should I tell my health care provider before I take this medicine? They need to know if you have any of these conditions: -if you frequently drink alcohol containing beverages -infection (especially a virus infection such as chickenpox, cold sores, or herpes) -kidney disease -liver disease -low blood counts, like low platelets, red bloods, or white blood cells -an unusual or allergic reaction to pemetrexed, mannitol, other medicines, foods, dyes, or preservatives -pregnant or trying to get pregnant -breast-feeding How should I use this medicine? This drug is given as an infusion into a vein. It is administered in a hospital or clinic by a specially trained health care professional. Talk to your pediatrician regarding the use of this medicine in children. Special care may be needed. Overdosage: If you think you have taken too much of this medicine contact a poison control center or emergency room at once. NOTE: This medicine is only for you. Do not share this medicine with others. What if I miss a dose? It is important not to miss  your dose. Call your doctor or health care professional if you are unable to keep an appointment. What may interact with this medicine? -aspirin and aspirin-like medicines -medicines to increase blood counts like filgrastim, pegfilgrastim,  sargramostim -methotrexate -NSAIDS, medicines for pain and inflammation, like ibuprofen or naproxen -probenecid -pyrimethamine -vaccines Talk to your doctor or health care professional before taking any of these medicines: -acetaminophen -aspirin -ibuprofen -ketoprofen -naproxen This list may not describe all possible interactions. Give your health care provider a list of all the medicines, herbs, non-prescription drugs, or dietary supplements you use. Also tell them if you smoke, drink alcohol, or use illegal drugs. Some items may interact with your medicine. What should I watch for while using this medicine? Visit your doctor for checks on your progress. This drug may make you feel generally unwell. This is not uncommon, as chemotherapy can affect healthy cells as well as cancer cells. Report any side effects. Continue your course of treatment even though you feel ill unless your doctor tells you to stop. In some cases, you may be given additional medicines to help with side effects. Follow all directions for their use. Call your doctor or health care professional for advice if you get a fever, chills or sore throat, or other symptoms of a cold or flu. Do not treat yourself. This drug decreases your body's ability to fight infections. Try to avoid being around people who are sick. This medicine may increase your risk to bruise or bleed. Call your doctor or health care professional if you notice any unusual bleeding. Be careful brushing and flossing your teeth or using a toothpick because you may get an infection or bleed more easily. If you have any dental work done, tell your dentist you are receiving this medicine. Avoid taking products that contain aspirin, acetaminophen, ibuprofen, naproxen, or ketoprofen unless instructed by your doctor. These medicines may hide a fever. Call your doctor or health care professional if you get diarrhea or mouth sores. Do not treat yourself. To protect your  kidneys, drink water or other fluids as directed while you are taking this medicine. Men and women must use effective birth control while taking this medicine. You may also need to continue using effective birth control for a time after stopping this medicine. Do not become pregnant while taking this medicine. Tell your doctor right away if you think that you or your partner might be pregnant. There is a potential for serious side effects to an unborn child. Talk to your health care professional or pharmacist for more information. Do not breast-feed an infant while taking this medicine. This medicine may lower sperm counts. What side effects may I notice from receiving this medicine? Side effects that you should report to your doctor or health care professional as soon as possible: -allergic reactions like skin rash, itching or hives, swelling of the face, lips, or tongue -low blood counts - this medicine may decrease the number of white blood cells, red blood cells and platelets. You may be at increased risk for infections and bleeding. -signs of infection - fever or chills, cough, sore throat, pain or difficulty passing urine -signs of decreased platelets or bleeding - bruising, pinpoint red spots on the skin, black, tarry stools, blood in the urine -signs of decreased red blood cells - unusually weak or tired, fainting spells, lightheadedness -breathing problems, like a dry cough -changes in emotions or moods -chest pain -confusion -diarrhea -high blood pressure -mouth or throat sores or ulcers -pain, swelling,  warmth in the leg -pain on swallowing -swelling of the ankles, feet, hands -trouble passing urine or change in the amount of urine -vomiting -yellowing of the eyes or skin Side effects that usually do not require medical attention (report to your doctor or health care professional if they continue or are bothersome): -hair loss -loss of appetite -nausea -stomach upset This list  may not describe all possible side effects. Call your doctor for medical advice about side effects. You may report side effects to FDA at 1-800-FDA-1088. Where should I keep my medicine? This drug is given in a hospital or clinic and will not be stored at home. NOTE: This sheet is a summary. It may not cover all possible information. If you have questions about this medicine, talk to your doctor, pharmacist, or health care provider.  2015, [employer]vier/Gold Standard. (2007-11-20 13:24:03)

## 2014-01-27 ENCOUNTER — Telehealth: Payer: Self-pay | Admitting: Medical Oncology

## 2014-01-27 ENCOUNTER — Other Ambulatory Visit (HOSPITAL_BASED_OUTPATIENT_CLINIC_OR_DEPARTMENT_OTHER): Payer: Medicare Other

## 2014-01-27 DIAGNOSIS — C450 Mesothelioma of pleura: Secondary | ICD-10-CM

## 2014-01-27 DIAGNOSIS — C384 Malignant neoplasm of pleura: Secondary | ICD-10-CM

## 2014-01-27 LAB — COMPREHENSIVE METABOLIC PANEL (CC13)
ALBUMIN: 3.1 g/dL — AB (ref 3.5–5.0)
ALK PHOS: 79 U/L (ref 40–150)
ALT: 6 U/L (ref 0–55)
AST: 15 U/L (ref 5–34)
Anion Gap: 11 mEq/L (ref 3–11)
BUN: 28.5 mg/dL — AB (ref 7.0–26.0)
CO2: 27 mEq/L (ref 22–29)
CREATININE: 1.4 mg/dL — AB (ref 0.7–1.3)
Calcium: 9.5 mg/dL (ref 8.4–10.4)
Chloride: 101 mEq/L (ref 98–109)
GLUCOSE: 101 mg/dL (ref 70–140)
POTASSIUM: 4.2 meq/L (ref 3.5–5.1)
Sodium: 139 mEq/L (ref 136–145)
Total Bilirubin: 0.3 mg/dL (ref 0.20–1.20)
Total Protein: 7.2 g/dL (ref 6.4–8.3)

## 2014-01-27 LAB — CBC WITH DIFFERENTIAL/PLATELET
BASO%: 0.3 % (ref 0.0–2.0)
BASOS ABS: 0 10*3/uL (ref 0.0–0.1)
EOS ABS: 0 10*3/uL (ref 0.0–0.5)
EOS%: 0.7 % (ref 0.0–7.0)
HEMATOCRIT: 36 % — AB (ref 38.4–49.9)
HEMOGLOBIN: 11.7 g/dL — AB (ref 13.0–17.1)
LYMPH%: 31.4 % (ref 14.0–49.0)
MCH: 29.6 pg (ref 27.2–33.4)
MCHC: 32.5 g/dL (ref 32.0–36.0)
MCV: 91.2 fL (ref 79.3–98.0)
MONO#: 0.1 10*3/uL (ref 0.1–0.9)
MONO%: 1.2 % (ref 0.0–14.0)
NEUT%: 66.4 % (ref 39.0–75.0)
NEUTROS ABS: 3.6 10*3/uL (ref 1.5–6.5)
Platelets: 146 10*3/uL (ref 140–400)
RBC: 3.94 10*6/uL — ABNORMAL LOW (ref 4.20–5.82)
RDW: 20.3 % — ABNORMAL HIGH (ref 11.0–14.6)
WBC: 5.4 10*3/uL (ref 4.0–10.3)
lymph#: 1.7 10*3/uL (ref 0.9–3.3)

## 2014-01-27 LAB — MAGNESIUM (CC13): Magnesium: 1.8 mg/dl (ref 1.5–2.5)

## 2014-01-27 NOTE — Telephone Encounter (Signed)
Labs reviewed with wife. I encouraged fluids. Pt nauseated so I instructed him to take compazine or zofran.

## 2014-01-28 ENCOUNTER — Other Ambulatory Visit: Payer: Medicare Other

## 2014-01-29 ENCOUNTER — Other Ambulatory Visit: Payer: Self-pay | Admitting: *Deleted

## 2014-01-29 ENCOUNTER — Ambulatory Visit (HOSPITAL_BASED_OUTPATIENT_CLINIC_OR_DEPARTMENT_OTHER): Payer: Medicare Other

## 2014-01-29 ENCOUNTER — Telehealth: Payer: Self-pay | Admitting: *Deleted

## 2014-01-29 DIAGNOSIS — E860 Dehydration: Secondary | ICD-10-CM

## 2014-01-29 DIAGNOSIS — C384 Malignant neoplasm of pleura: Secondary | ICD-10-CM

## 2014-01-29 DIAGNOSIS — C450 Mesothelioma of pleura: Secondary | ICD-10-CM

## 2014-01-29 MED ORDER — ONDANSETRON 8 MG/50ML IVPB (CHCC)
8.0000 mg | Freq: Once | INTRAVENOUS | Status: DC
  Administered 2014-01-29: 8 mg via INTRAVENOUS

## 2014-01-29 MED ORDER — SODIUM CHLORIDE 0.9 % IV SOLN
Freq: Once | INTRAVENOUS | Status: DC
  Administered 2014-01-29: 12:00:00 via INTRAVENOUS

## 2014-01-29 MED ORDER — ONDANSETRON 8 MG/NS 50 ML IVPB
INTRAVENOUS | Status: AC
  Filled 2014-01-29: qty 8

## 2014-01-29 NOTE — Telephone Encounter (Signed)
   Provider input needed: dehydration, not eating or drinking much, nauseated, constipated, dry heaves, sleeping a lot    Reason for call: wife, [name] thinks he needs IVF due to dehydration  Gastrointestinal: positive for constipation, nausea and vomiting-primarily dry heaves & no appetite.   ALLERGIES:  is allergic to other.-yellow jackets  Patient last received chemotherapy/ treatment on 01/21/14  Patient was last seen in the office on 01/13/14  Next appt is 02/03/14 for labs, 02/10/14 for MD visit/Dr. Julien Nordmann  Is patient having fevers greater than 100.5?  no   Is patient having uncontrolled pain, or new pain? no   Is patient having new back pain that changes with position (worsens or eases when laying down?)  no   Is patient able to eat and drink? No    Is patient able to pass stool without difficulty? No He reports last BM on Sunday which was hard.  He is taking stool softeners & laxatives.  He doesn't c/o cramping.     Is patient having uncontrolled nausea?  yes    family calls 01/29/2014 with complaint of  Gastrointestinal: positive for constipation, nausea and vomiting- primarily dry heaves  Summary Based on the above information advised family to take compazine now & I will discuss with Dr Julien Nordmann & call her back. Discussed with Dr Julien Nordmann & asked pt to come in for IVF & zofran per orders.  Wife/Linda states she will get him here.   Jesse Fall  01/29/2014, 10:06 AM   Background Info  [name]   DOB: [birth year]   MR#: [id number]   CSN#   [id number] 01/29/2014

## 2014-01-29 NOTE — Patient Instructions (Signed)
Dehydration, Adult Dehydration is when you lose more fluids from the body than you take in. Vital organs like the kidneys, brain, and heart cannot function without a proper amount of fluids and salt. Any loss of fluids from the body can cause dehydration.  CAUSES   Vomiting.  Diarrhea.  Excessive sweating.  Excessive urine output.  Fever. SYMPTOMS  Mild dehydration  Thirst.  Dry lips.  Slightly dry mouth. Moderate dehydration  Very dry mouth.  Sunken eyes.  Skin does not bounce back quickly when lightly pinched and released.  Dark urine and decreased urine production.  Decreased tear production.  Headache. Severe dehydration  Very dry mouth.  Extreme thirst.  Rapid, weak pulse (more than 100 beats per minute at rest).  Cold hands and feet.  Not able to sweat in spite of heat and temperature.  Rapid breathing.  Blue lips.  Confusion and lethargy.  Difficulty being awakened.  Minimal urine production.  No tears. DIAGNOSIS  Your caregiver will diagnose dehydration based on your symptoms and your exam. Blood and urine tests will help confirm the diagnosis. The diagnostic evaluation should also identify the cause of dehydration. TREATMENT  Treatment of mild or moderate dehydration can often be done at home by increasing the amount of fluids that you drink. It is best to drink small amounts of fluid more often. Drinking too much at one time can make vomiting worse. Refer to the home care instructions below. Severe dehydration needs to be treated at the hospital where you will probably be given intravenous (IV) fluids that contain water and electrolytes. HOME CARE INSTRUCTIONS   Ask your caregiver about specific rehydration instructions.  Drink enough fluids to keep your urine clear or pale yellow.  Drink small amounts frequently if you have nausea and vomiting.  Eat as you normally do.  Avoid:  Foods or drinks high in sugar.  Carbonated  drinks.  Juice.  Extremely hot or cold fluids.  Drinks with caffeine.  Fatty, greasy foods.  Alcohol.  Tobacco.  Overeating.  Gelatin desserts.  Wash your hands well to avoid spreading bacteria and viruses.  Only take over-the-counter or prescription medicines for pain, discomfort, or fever as directed by your caregiver.  Ask your caregiver if you should continue all prescribed and over-the-counter medicines.  Keep all follow-up appointments with your caregiver. SEEK MEDICAL CARE IF:  You have abdominal pain and it increases or stays in one area (localizes).  You have a rash, stiff neck, or severe headache.  You are irritable, sleepy, or difficult to awaken.  You are weak, dizzy, or extremely thirsty. SEEK IMMEDIATE MEDICAL CARE IF:   You are unable to keep fluids down or you get worse despite treatment.  You have frequent episodes of vomiting or diarrhea.  You have blood or green matter (bile) in your vomit.  You have blood in your stool or your stool looks black and tarry.  You have not urinated in 6 to 8 hours, or you have only urinated a small amount of very dark urine.  You have a fever.  You faint. MAKE SURE YOU:   Understand these instructions.  Will watch your condition.  Will get help right away if you are not doing well or get worse. Document Released: 04/18/2005 Document Revised: 07/11/2011 Document Reviewed: 12/06/2010 Exit[employer] Patient Information 2015 [employer]. This information is not intended to replace advice given to you by your health care provider. Make sure you discuss any questions you have with your health care   provider.  

## 2014-02-03 ENCOUNTER — Other Ambulatory Visit (HOSPITAL_BASED_OUTPATIENT_CLINIC_OR_DEPARTMENT_OTHER): Payer: Medicare Other

## 2014-02-03 DIAGNOSIS — C450 Mesothelioma of pleura: Secondary | ICD-10-CM

## 2014-02-03 LAB — CBC WITH DIFFERENTIAL/PLATELET
BASO%: 0.3 % (ref 0.0–2.0)
Basophils Absolute: 0 10*3/uL (ref 0.0–0.1)
EOS%: 1.7 % (ref 0.0–7.0)
Eosinophils Absolute: 0.1 10*3/uL (ref 0.0–0.5)
HCT: 33.5 % — ABNORMAL LOW (ref 38.4–49.9)
HGB: 10.9 g/dL — ABNORMAL LOW (ref 13.0–17.1)
LYMPH%: 38.2 % (ref 14.0–49.0)
MCH: 29.7 pg (ref 27.2–33.4)
MCHC: 32.6 g/dL (ref 32.0–36.0)
MCV: 91.3 fL (ref 79.3–98.0)
MONO#: 0.4 10*3/uL (ref 0.1–0.9)
MONO%: 11.4 % (ref 0.0–14.0)
NEUT#: 1.6 10*3/uL (ref 1.5–6.5)
NEUT%: 48.4 % (ref 39.0–75.0)
Platelets: 124 10*3/uL — ABNORMAL LOW (ref 140–400)
RBC: 3.67 10*6/uL — AB (ref 4.20–5.82)
RDW: 19.5 % — ABNORMAL HIGH (ref 11.0–14.6)
WBC: 3.2 10*3/uL — ABNORMAL LOW (ref 4.0–10.3)
lymph#: 1.2 10*3/uL (ref 0.9–3.3)

## 2014-02-03 LAB — COMPREHENSIVE METABOLIC PANEL (CC13)
ALBUMIN: 3.1 g/dL — AB (ref 3.5–5.0)
ALK PHOS: 92 U/L (ref 40–150)
ALT: 9 U/L (ref 0–55)
AST: 15 U/L (ref 5–34)
Anion Gap: 10 mEq/L (ref 3–11)
BILIRUBIN TOTAL: 0.28 mg/dL (ref 0.20–1.20)
BUN: 22.3 mg/dL (ref 7.0–26.0)
CO2: 26 mEq/L (ref 22–29)
CREATININE: 1.3 mg/dL (ref 0.7–1.3)
Calcium: 9.8 mg/dL (ref 8.4–10.4)
Chloride: 103 mEq/L (ref 98–109)
GLUCOSE: 108 mg/dL (ref 70–140)
POTASSIUM: 4.3 meq/L (ref 3.5–5.1)
Sodium: 138 mEq/L (ref 136–145)
Total Protein: 7 g/dL (ref 6.4–8.3)

## 2014-02-03 LAB — MAGNESIUM (CC13): MAGNESIUM: 2 mg/dL (ref 1.5–2.5)

## 2014-02-04 ENCOUNTER — Telehealth: Payer: Self-pay | Admitting: Medical Oncology

## 2014-02-04 ENCOUNTER — Other Ambulatory Visit: Payer: Self-pay | Admitting: Medical Oncology

## 2014-02-04 DIAGNOSIS — C450 Mesothelioma of pleura: Secondary | ICD-10-CM

## 2014-02-04 MED ORDER — PROCHLORPERAZINE MALEATE 10 MG PO TABS: 10.0000 mg | ORAL_TABLET | Freq: Four times a day (QID) | ORAL | Status: AC | PRN

## 2014-02-04 MED ORDER — ONDANSETRON HCL 8 MG PO TABS: 8.0000 mg | ORAL_TABLET | Freq: Three times a day (TID) | ORAL | Status: AC | PRN

## 2014-02-04 NOTE — Telephone Encounter (Signed)
reviewed labs with pt and refills done.

## 2014-02-10 ENCOUNTER — Ambulatory Visit (HOSPITAL_COMMUNITY)
Admission: RE | Admit: 2014-02-10 | Discharge: 2014-02-10 | Disposition: A | Discharge status: Home / Self Care | Payer: Medicare Other | Source: Ambulatory Visit | Attending: Internal Medicine | Admitting: Internal Medicine

## 2014-02-10 ENCOUNTER — Telehealth: Payer: Self-pay | Admitting: Internal Medicine

## 2014-02-10 ENCOUNTER — Telehealth: Payer: Self-pay | Admitting: *Deleted

## 2014-02-10 ENCOUNTER — Ambulatory Visit (HOSPITAL_BASED_OUTPATIENT_CLINIC_OR_DEPARTMENT_OTHER): Payer: Medicare Other | Admitting: Internal Medicine

## 2014-02-10 ENCOUNTER — Other Ambulatory Visit (HOSPITAL_BASED_OUTPATIENT_CLINIC_OR_DEPARTMENT_OTHER): Payer: Medicare Other

## 2014-02-10 VITALS — BP 146/72 | HR 62 | Temp 98.1°F | Resp 18 | Ht 68.0 in | Wt 148.4 lb

## 2014-02-10 DIAGNOSIS — C450 Mesothelioma of pleura: Secondary | ICD-10-CM | POA: Insufficient documentation

## 2014-02-10 DIAGNOSIS — J90 Pleural effusion, not elsewhere classified: Secondary | ICD-10-CM | POA: Insufficient documentation

## 2014-02-10 DIAGNOSIS — R0602 Shortness of breath: Secondary | ICD-10-CM | POA: Diagnosis present

## 2014-02-10 DIAGNOSIS — Z79899 Other long term (current) drug therapy: Secondary | ICD-10-CM | POA: Insufficient documentation

## 2014-02-10 LAB — CBC WITH DIFFERENTIAL/PLATELET
BASO%: 0.6 % (ref 0.0–2.0)
Basophils Absolute: 0 10*3/uL (ref 0.0–0.1)
EOS ABS: 0.2 10*3/uL (ref 0.0–0.5)
EOS%: 4.9 % (ref 0.0–7.0)
HEMATOCRIT: 33.5 % — AB (ref 38.4–49.9)
HEMOGLOBIN: 11 g/dL — AB (ref 13.0–17.1)
LYMPH%: 50.6 % — AB (ref 14.0–49.0)
MCH: 30.4 pg (ref 27.2–33.4)
MCHC: 32.9 g/dL (ref 32.0–36.0)
MCV: 92.5 fL (ref 79.3–98.0)
MONO#: 0.7 10*3/uL (ref 0.1–0.9)
MONO%: 19.9 % — ABNORMAL HIGH (ref 0.0–14.0)
NEUT%: 24 % — AB (ref 39.0–75.0)
NEUTROS ABS: 0.8 10*3/uL — AB (ref 1.5–6.5)
PLATELETS: 262 10*3/uL (ref 140–400)
RBC: 3.62 10*6/uL — ABNORMAL LOW (ref 4.20–5.82)
RDW: 19.1 % — ABNORMAL HIGH (ref 11.0–14.6)
WBC: 3.4 10*3/uL — ABNORMAL LOW (ref 4.0–10.3)
lymph#: 1.7 10*3/uL (ref 0.9–3.3)

## 2014-02-10 LAB — COMPREHENSIVE METABOLIC PANEL (CC13)
ALBUMIN: 2.9 g/dL — AB (ref 3.5–5.0)
ALT: 7 U/L (ref 0–55)
AST: 17 U/L (ref 5–34)
Alkaline Phosphatase: 90 U/L (ref 40–150)
Anion Gap: 13 mEq/L — ABNORMAL HIGH (ref 3–11)
BILIRUBIN TOTAL: 0.22 mg/dL (ref 0.20–1.20)
BUN: 24.6 mg/dL (ref 7.0–26.0)
CO2: 22 meq/L (ref 22–29)
CREATININE: 1.4 mg/dL — AB (ref 0.7–1.3)
Calcium: 9.5 mg/dL (ref 8.4–10.4)
Chloride: 105 mEq/L (ref 98–109)
Glucose: 109 mg/dl (ref 70–140)
Potassium: 4.4 mEq/L (ref 3.5–5.1)
Sodium: 140 mEq/L (ref 136–145)
Total Protein: 7 g/dL (ref 6.4–8.3)

## 2014-02-10 LAB — MAGNESIUM (CC13): Magnesium: 2.1 mg/dl (ref 1.5–2.5)

## 2014-02-10 MED ORDER — OXYCODONE HCL 5 MG PO TABS: 5.0000 mg | ORAL_TABLET | Freq: Four times a day (QID) | ORAL | Status: DC | PRN

## 2014-02-10 NOTE — Telephone Encounter (Signed)
Pt confirmed labs/ov per 10/12 POF, sent msg to add chemo and hold for this week, gave pt AVS.... KJ

## 2014-02-10 NOTE — Progress Notes (Signed)
[facility] Telephone:([phone]   [phone]  OFFICE PROGRESS NOTE  [name], MD [place] [address]  DIAGNOSIS: Malignant pleural mesothelioma  Primary site: Pleural Mesothelioma (Right)  Staging method: AJCC 7th Edition  Clinical: Stage II (T2, N0, M0) signed by Curt Bears, MD on 10/09/2013 12:58 PM  Summary: Stage II (T2, N0, M0)   PRIOR THERAPY: Status post right VATS with drainage of right pleural effusion and pleural biopsies on 09/25/2013 the care of Dr. Roxan Hockey   CURRENT THERAPY: Systemic chemotherapy with cisplatin 75 mg/m2 and Alimta 500 mg/m2 given every 3 weeks. Status post 5 cycles.  INTERVAL HISTORY: Sean Mitchell. 80 y.o. male returns to the clinic today for followup visit accompanied by his wife. The patient is feeling fine today with no specific complaints except for mild to moderate pain and tenderness on the right side of his chest. He also has shortness breath with exertion. He tolerated the last cycle of his systemic chemotherapy with cisplatin and Alimta fairly well. He denied having any significant weight loss or night sweats. He has no fever or chills. He denied having any cough or hemoptysis. He is here today to start cycle #6 of his chemotherapy.  MEDICAL HISTORY: Past Medical History  Diagnosis Date  . Personal history of contact with and (suspected) exposure to asbestos   . Unspecified essential hypertension   . Other and unspecified hyperlipidemia   . Esophageal reflux   . Diverticulosis of colon (without mention of hemorrhage)   . Elevated prostate specific antigen (PSA)   . Osteoarthrosis, unspecified whether generalized or localized, unspecified site   . Anxiety state, unspecified   . Rectal mass   . Shortness of breath     due to fluid    ALLERGIES:  is allergic to other.  MEDICATIONS:  Current Outpatient Prescriptions  Medication Sig Dispense Refill  . aspirin 81  MG tablet Take 81 mg by mouth daily.        Marland Kitchen atenolol (TENORMIN) 25 MG tablet Take 1 tablet (25 mg total) by mouth daily.  100 tablet  3  . dexamethasone (DECADRON) 4 MG tablet Take 4 mg by mouth 2 (two) times daily with a meal. 1 tablet twice daily the day before, the day of, and the day after chemo      . EPINEPHrine (EPIPEN 2-PAK) 0.3 mg/0.3 mL DEVI Inject 0.3 mLs (0.3 mg total) into the muscle once.  1 Device  1  . esomeprazole (NEXIUM) 20 MG capsule Take 20 mg by mouth daily at 12 noon.      . finasteride (PROSCAR) 5 MG tablet Take 1 tablet (5 mg total) by mouth as directed. Monday, Wednesday, Friday and Sunday  657 tablet  3  . folic acid (FOLVITE) 1 MG tablet Take 1 tablet (1 mg total) by mouth daily.  30 tablet  4  . HYDROcodone-homatropine (HYCODAN) 5-1.5 MG/5ML syrup Take 5 mLs by mouth every 6 (six) hours as needed for cough.      . ondansetron (ZOFRAN) 8 MG tablet Take 1 tablet (8 mg total) by mouth every 8 (eight) hours as needed for nausea or vomiting.  30 tablet  1  . prochlorperazine (COMPAZINE) 10 MG tablet Take 1 tablet (10 mg total) by mouth every 6 (six) hours as needed for nausea or vomiting.  60 tablet  1  . terazosin (HYTRIN) 10 MG capsule Take 1 capsule (10 mg total) by mouth  every evening.  100 capsule  3  . traMADol (ULTRAM) 50 MG tablet Take 1 tablet (50 mg total) by mouth every 6 (six) hours as needed.  30 tablet  0  . oxyCODONE (OXY IR/ROXICODONE) 5 MG immediate release tablet Take 1 tablet (5 mg total) by mouth every 6 (six) hours as needed for severe pain.  30 tablet  0   No current facility-administered medications for this visit.    SURGICAL HISTORY:  Past Surgical History  Procedure Laterality Date  . Inguinal hernia repair  1989    left  . Colonoscopy w/ polypectomy    . Video assisted thoracoscopy (vats)/decortication Right 09/25/2013    Procedure: VIDEO ASSISTED THORACOSCOPY (VATS)/POSSIBLE DECORTICATION;  Surgeon: Melrose Nakayama, MD;  Location: [facility];  Service: Thoracic;  Laterality: Right;  . Pleural effusion drainage Right 09/25/2013    Procedure: DRAINAGE OF PLEURAL EFFUSION;  Surgeon: Melrose Nakayama, MD;  Location: [facility];  Service: Thoracic;  Laterality: Right;    REVIEW OF SYSTEMS:  A comprehensive review of systems was negative except for: Constitutional: positive for fatigue Respiratory: positive for dyspnea on exertion and pleurisy/chest pain Gastrointestinal: positive for nausea   PHYSICAL EXAMINATION: General appearance: alert, cooperative, fatigued and no distress Head: Normocephalic, without obvious abnormality, atraumatic Neck: no adenopathy, no JVD, supple, symmetrical, trachea midline and thyroid not enlarged, symmetric, no tenderness/mass/nodules Lymph nodes: Cervical, supraclavicular, and axillary nodes normal. Resp: clear to auscultation bilaterally Back: symmetric, no curvature. ROM normal. No CVA tenderness. Cardio: regular rate and rhythm, S1, S2 normal, no murmur, click, rub or gallop GI: soft, non-tender; bowel sounds normal; no masses,  no organomegaly Extremities: extremities normal, atraumatic, no cyanosis or edema Neurologic: Alert and oriented X 3, normal strength and tone. Normal symmetric reflexes. Normal coordination and gait  ECOG PERFORMANCE STATUS: 1 - Symptomatic but completely ambulatory  Blood pressure 146/72, pulse 62, temperature 98.1 F (36.7 C), temperature source Oral, resp. rate 18, height 5\' 8"  (1.727 m), weight 148 lb 6.4 oz (67.314 kg), SpO2 97.00%.  LABORATORY DATA: Lab Results  Component Value Date   WBC 3.4* 02/10/2014   HGB 11.0* 02/10/2014   HCT 33.5* 02/10/2014   MCV 92.5 02/10/2014   PLT 262 02/10/2014      Chemistry      Component Value Date/Time   NA 140 02/10/2014 1108   NA 136 12/10/2013 1045   K 4.4 02/10/2014 1108   K 4.1 12/10/2013 1045   CL 101 12/10/2013 1045   CO2 22 02/10/2014 1108   CO2 28 12/10/2013 1045   BUN 24.6 02/10/2014 1108   BUN 21  12/10/2013 1045   CREATININE 1.4* 02/10/2014 1108   CREATININE 1.32 12/10/2013 1045      Component Value Date/Time   CALCIUM 9.5 02/10/2014 1108   CALCIUM 8.6 12/10/2013 1045   ALKPHOS 90 02/10/2014 1108   ALKPHOS 73 12/10/2013 1045   AST 17 02/10/2014 1108   AST 14 12/10/2013 1045   ALT 7 02/10/2014 1108   ALT <8 12/10/2013 1045   BILITOT 0.22 02/10/2014 1108   BILITOT 0.3 12/10/2013 1045       RADIOGRAPHIC STUDIES: Dg Chest 1 View  02/10/2014   CLINICAL DATA:  Malignant pleural mesothelioma C45.0 (ICD-10-CM), subsequent encounter. Shortness of breath for 3 days. Evaluate for recurrent right pleural effusion. Undergoing chemotherapy.  EXAM: CHEST - 1 VIEW  COMPARISON:  CT chest 12/06/2013 and chest radiograph 10/15/2013.  FINDINGS: Trachea is midline. Heart size normal. Pleural parenchymal opacity  at the base of the right hemi thorax is unchanged from 10/15/2013. Pleural thickening at the apex of the right hemi thorax. Tiny left pleural effusion. Left lung is clear.  IMPRESSION: 1. Pleural parenchymal opacification at the base of the right hemi thorax, unchanged from 10/15/2013 and shown to represent a combination of pleural thickening and fluid on 12/16/2013. 2. Tiny left pleural effusion.   Electronically Signed   By: Lorin Picket M.D.   On: 02/10/2014 13:06   ASSESSMENT AND PLAN: This is a very pleasant 80 years old white male recently diagnosed with malignant pleural mesothelioma involving the right lung and currently undergoing systemic chemotherapy with cisplatin and Alimta status post 5 cycles. He is rating his treatment fairly well but recently started having pain in the right chest wall area with shortness breath with exertion. I recommended for the patient to have chest x-ray performed today to rule out any reaccumulation of the right pleural effusion. His absolute neutrophil count is low today and I recommended for him to delay the start of cycle #6 by 1 week to give him more time  for recovery of his neutrophil count. I also encouraged the patient to increase his by mouth intake because of the mild renal insufficiency. He would come back for followup visit in 4 weeks after repeating CT scan of the chest, abdomen and pelvis for restaging of his disease.  He was advised to call me immediately if he has any concerning symptoms in the interval. The patient voices understanding of current disease status and treatment options and is in agreement with the current care plan.  All questions were answered. The patient knows to call the clinic with any problems, questions or concerns. We can certainly see the patient much sooner if necessary.  Disclaimer: This note was dictated with voice recognition software. Similar sounding words can inadvertently be transcribed and may not be corrected upon review.

## 2014-02-10 NOTE — Telephone Encounter (Signed)
Per staff message and POF I have scheduled appts. Advised scheduler of appts. JMW  

## 2014-02-11 ENCOUNTER — Telehealth: Payer: Self-pay | Admitting: *Deleted

## 2014-02-11 ENCOUNTER — Ambulatory Visit: Payer: Medicare Other

## 2014-02-11 NOTE — Telephone Encounter (Signed)
No new orders received from Dr. Julien Nordmann.  No change noted with the 02-10-2014 CXR per Dr. Julien Nordmann.  Notified Sean Mitchell, who is "relieved and will take this one day at a time".  Currently "short of breath with any movement.  Coughs with meals mostly that is non-productive.  Not using the Hycodan syrup.  No swelling anywhere in his body.  Complains of all food not tasting right.  He's just sick from the chemotherapy".  Asked that she call if symptoms progress.  Sean Mitchell says she knows to go to the ER also.

## 2014-02-11 NOTE — Telephone Encounter (Signed)
Wife [name] called requesting results of cxr.  "Jim's symptoms are the same with shortness of breath.  He's not had effusion with fluid pulled off since May but we'd like a call to know what to do next."  [name] can be reached at [phone].  This message to Dr. Worthy Flank desk.

## 2014-02-17 ENCOUNTER — Other Ambulatory Visit: Payer: Self-pay | Admitting: *Deleted

## 2014-02-17 ENCOUNTER — Other Ambulatory Visit (HOSPITAL_BASED_OUTPATIENT_CLINIC_OR_DEPARTMENT_OTHER): Payer: Medicare Other

## 2014-02-17 ENCOUNTER — Telehealth: Payer: Self-pay | Admitting: *Deleted

## 2014-02-17 DIAGNOSIS — C450 Mesothelioma of pleura: Secondary | ICD-10-CM

## 2014-02-17 LAB — COMPREHENSIVE METABOLIC PANEL (CC13)
ALT: 7 U/L (ref 0–55)
AST: 16 U/L (ref 5–34)
Albumin: 3 g/dL — ABNORMAL LOW (ref 3.5–5.0)
Alkaline Phosphatase: 89 U/L (ref 40–150)
Anion Gap: 8 mEq/L (ref 3–11)
BILIRUBIN TOTAL: 0.22 mg/dL (ref 0.20–1.20)
BUN: 24.1 mg/dL (ref 7.0–26.0)
CHLORIDE: 106 meq/L (ref 98–109)
CO2: 23 mEq/L (ref 22–29)
CREATININE: 1.3 mg/dL (ref 0.7–1.3)
Calcium: 9.8 mg/dL (ref 8.4–10.4)
Glucose: 121 mg/dl (ref 70–140)
Potassium: 4.5 mEq/L (ref 3.5–5.1)
Sodium: 137 mEq/L (ref 136–145)
TOTAL PROTEIN: 6.7 g/dL (ref 6.4–8.3)

## 2014-02-17 LAB — CBC WITH DIFFERENTIAL/PLATELET
BASO%: 0.3 % (ref 0.0–2.0)
Basophils Absolute: 0 10*3/uL (ref 0.0–0.1)
EOS%: 1.9 % (ref 0.0–7.0)
Eosinophils Absolute: 0.1 10*3/uL (ref 0.0–0.5)
HCT: 33.4 % — ABNORMAL LOW (ref 38.4–49.9)
HGB: 10.9 g/dL — ABNORMAL LOW (ref 13.0–17.1)
LYMPH#: 1.2 10*3/uL (ref 0.9–3.3)
LYMPH%: 19.5 % (ref 14.0–49.0)
MCH: 29.8 pg (ref 27.2–33.4)
MCHC: 32.6 g/dL (ref 32.0–36.0)
MCV: 91.3 fL (ref 79.3–98.0)
MONO#: 0.4 10*3/uL (ref 0.1–0.9)
MONO%: 7.1 % (ref 0.0–14.0)
NEUT#: 4.2 10*3/uL (ref 1.5–6.5)
NEUT%: 71.2 % (ref 39.0–75.0)
Platelets: 179 10*3/uL (ref 140–400)
RBC: 3.66 10*6/uL — AB (ref 4.20–5.82)
RDW: 17.3 % — ABNORMAL HIGH (ref 11.0–14.6)
WBC: 5.9 10*3/uL (ref 4.0–10.3)

## 2014-02-17 LAB — MAGNESIUM (CC13): Magnesium: 2.1 mg/dl (ref 1.5–2.5)

## 2014-02-17 NOTE — Telephone Encounter (Signed)
Pt wanted to know lab results from today.  Informed patient of lab results.

## 2014-02-18 ENCOUNTER — Ambulatory Visit (HOSPITAL_BASED_OUTPATIENT_CLINIC_OR_DEPARTMENT_OTHER): Payer: Medicare Other

## 2014-02-18 VITALS — BP 133/76 | HR 67 | Temp 97.5°F

## 2014-02-18 DIAGNOSIS — Z5111 Encounter for antineoplastic chemotherapy: Secondary | ICD-10-CM

## 2014-02-18 DIAGNOSIS — C450 Mesothelioma of pleura: Secondary | ICD-10-CM

## 2014-02-18 MED ORDER — POTASSIUM CHLORIDE 2 MEQ/ML IV SOLN
Freq: Once | INTRAVENOUS | Status: AC
  Administered 2014-02-18: 09:00:00 via INTRAVENOUS
  Filled 2014-02-18: qty 10

## 2014-02-18 MED ORDER — DEXAMETHASONE SODIUM PHOSPHATE 20 MG/5ML IJ SOLN
12.0000 mg | Freq: Once | INTRAMUSCULAR | Status: AC
  Administered 2014-02-18: 12 mg via INTRAVENOUS

## 2014-02-18 MED ORDER — SODIUM CHLORIDE 0.9 % IJ SOLN
10.0000 mL | INTRAMUSCULAR | Status: DC | PRN
  Filled 2014-02-18: qty 10

## 2014-02-18 MED ORDER — PALONOSETRON HCL INJECTION 0.25 MG/5ML
0.2500 mg | Freq: Once | INTRAVENOUS | Status: AC
  Administered 2014-02-18: 0.25 mg via INTRAVENOUS

## 2014-02-18 MED ORDER — SODIUM CHLORIDE 0.9 % IV SOLN
75.0000 mg/m2 | Freq: Once | INTRAVENOUS | Status: AC
  Administered 2014-02-18: 143 mg via INTRAVENOUS
  Filled 2014-02-18: qty 143

## 2014-02-18 MED ORDER — DEXAMETHASONE SODIUM PHOSPHATE 20 MG/5ML IJ SOLN
INTRAMUSCULAR | Status: AC
  Filled 2014-02-18: qty 5

## 2014-02-18 MED ORDER — HEPARIN SOD (PORK) LOCK FLUSH 100 UNIT/ML IV SOLN
500.0000 [IU] | Freq: Once | INTRAVENOUS | Status: DC | PRN
  Filled 2014-02-18: qty 5

## 2014-02-18 MED ORDER — PALONOSETRON HCL INJECTION 0.25 MG/5ML
INTRAVENOUS | Status: AC
  Filled 2014-02-18: qty 5

## 2014-02-18 MED ORDER — SODIUM CHLORIDE 0.9 % IV SOLN
150.0000 mg | Freq: Once | INTRAVENOUS | Status: AC
  Administered 2014-02-18: 150 mg via INTRAVENOUS
  Filled 2014-02-18: qty 5

## 2014-02-18 MED ORDER — SODIUM CHLORIDE 0.9 % IV SOLN
500.0000 mg/m2 | Freq: Once | INTRAVENOUS | Status: AC
  Administered 2014-02-18: 950 mg via INTRAVENOUS
  Filled 2014-02-18: qty 38

## 2014-02-18 NOTE — Patient Instructions (Signed)
[facility] Discharge Instructions for Patients Receiving Chemotherapy  Today you received the following chemotherapy agents cisplatin/alimta  To help prevent nausea and vomiting after your treatment, we encourage you to take your nausea medication as directed   If you develop nausea and vomiting that is not controlled by your nausea medication, call the clinic.   BELOW ARE SYMPTOMS THAT SHOULD BE REPORTED IMMEDIATELY:  *FEVER GREATER THAN 100.5 F  *CHILLS WITH OR WITHOUT FEVER  NAUSEA AND VOMITING THAT IS NOT CONTROLLED WITH YOUR NAUSEA MEDICATION  *UNUSUAL SHORTNESS OF BREATH  *UNUSUAL BRUISING OR BLEEDING  TENDERNESS IN MOUTH AND THROAT WITH OR WITHOUT PRESENCE OF ULCERS  *URINARY PROBLEMS  *BOWEL PROBLEMS  UNUSUAL RASH Items with * indicate a potential emergency and should be followed up as soon as possible.  Feel free to call the clinic you have any questions or concerns. The clinic phone number is [phone].

## 2014-02-24 ENCOUNTER — Other Ambulatory Visit: Payer: Self-pay | Admitting: Medical Oncology

## 2014-02-24 ENCOUNTER — Other Ambulatory Visit (HOSPITAL_BASED_OUTPATIENT_CLINIC_OR_DEPARTMENT_OTHER): Payer: Medicare Other

## 2014-02-24 DIAGNOSIS — C450 Mesothelioma of pleura: Secondary | ICD-10-CM

## 2014-02-24 LAB — CBC WITH DIFFERENTIAL/PLATELET
BASO%: 0.2 % (ref 0.0–2.0)
Basophils Absolute: 0 10*3/uL (ref 0.0–0.1)
EOS%: 0.7 % (ref 0.0–7.0)
Eosinophils Absolute: 0 10*3/uL (ref 0.0–0.5)
HEMATOCRIT: 36.4 % — AB (ref 38.4–49.9)
HEMOGLOBIN: 11.9 g/dL — AB (ref 13.0–17.1)
LYMPH%: 24.6 % (ref 14.0–49.0)
MCH: 30.4 pg (ref 27.2–33.4)
MCHC: 32.7 g/dL (ref 32.0–36.0)
MCV: 92.9 fL (ref 79.3–98.0)
MONO#: 0.1 10*3/uL (ref 0.1–0.9)
MONO%: 1.8 % (ref 0.0–14.0)
NEUT#: 4.7 10*3/uL (ref 1.5–6.5)
NEUT%: 72.7 % (ref 39.0–75.0)
PLATELETS: 151 10*3/uL (ref 140–400)
RBC: 3.92 10*6/uL — ABNORMAL LOW (ref 4.20–5.82)
RDW: 17.7 % — ABNORMAL HIGH (ref 11.0–14.6)
WBC: 6.5 10*3/uL (ref 4.0–10.3)
lymph#: 1.6 10*3/uL (ref 0.9–3.3)

## 2014-02-24 LAB — COMPREHENSIVE METABOLIC PANEL (CC13)
ALK PHOS: 81 U/L (ref 40–150)
ALT: 14 U/L (ref 0–55)
AST: 17 U/L (ref 5–34)
Albumin: 3 g/dL — ABNORMAL LOW (ref 3.5–5.0)
Anion Gap: 8 mEq/L (ref 3–11)
BUN: 34.5 mg/dL — AB (ref 7.0–26.0)
CALCIUM: 9.6 mg/dL (ref 8.4–10.4)
CO2: 27 mEq/L (ref 22–29)
CREATININE: 1.5 mg/dL — AB (ref 0.7–1.3)
Chloride: 102 mEq/L (ref 98–109)
Glucose: 92 mg/dl (ref 70–140)
Potassium: 4.7 mEq/L (ref 3.5–5.1)
Sodium: 138 mEq/L (ref 136–145)
Total Bilirubin: 0.31 mg/dL (ref 0.20–1.20)
Total Protein: 6.8 g/dL (ref 6.4–8.3)

## 2014-02-26 ENCOUNTER — Telehealth: Payer: Self-pay | Admitting: Medical Oncology

## 2014-02-26 ENCOUNTER — Ambulatory Visit (HOSPITAL_BASED_OUTPATIENT_CLINIC_OR_DEPARTMENT_OTHER): Payer: Medicare Other | Admitting: Nurse Practitioner

## 2014-02-26 VITALS — BP 141/80 | HR 74 | Temp 98.3°F | Resp 18 | Ht 68.0 in | Wt 147.0 lb

## 2014-02-26 DIAGNOSIS — K5900 Constipation, unspecified: Secondary | ICD-10-CM

## 2014-02-26 DIAGNOSIS — I82611 Acute embolism and thrombosis of superficial veins of right upper extremity: Secondary | ICD-10-CM

## 2014-02-26 DIAGNOSIS — B029 Zoster without complications: Secondary | ICD-10-CM

## 2014-02-26 DIAGNOSIS — C450 Mesothelioma of pleura: Secondary | ICD-10-CM

## 2014-02-26 MED ORDER — OXYCODONE HCL 5 MG PO TABS: ORAL_TABLET | ORAL | Status: DC

## 2014-02-26 MED ORDER — VALACYCLOVIR HCL 1 G PO TABS: 1000.0000 mg | ORAL_TABLET | Freq: Three times a day (TID) | ORAL | Status: AC

## 2014-02-26 NOTE — Telephone Encounter (Signed)
Pt reports sharp stabbing pain on right thorax, interfering with his sleep. Wife notices a rash over the area. She is wondering if he has shingles. Appt made for today.

## 2014-02-27 ENCOUNTER — Encounter: Payer: Self-pay | Admitting: Nurse Practitioner

## 2014-02-27 DIAGNOSIS — K5900 Constipation, unspecified: Secondary | ICD-10-CM | POA: Insufficient documentation

## 2014-02-27 NOTE — Assessment & Plan Note (Signed)
Patient states that he did obtain a she will specks in the in a few years ago; but it does appear that patient has a mild shingles rash to his right mid back which is radiating around his right chest wall.  This area is not pruritic; but is quite painful.  Patient will be prescribed valacyclovir 1 g 3 times per day for a total of 7 days.  Patient was also given a refill of his oxycodone today.

## 2014-02-27 NOTE — Assessment & Plan Note (Signed)
Patient is concerned regarding the use of oxycodone; stating that he experiences chronic constipation issues.  Patient states that he has been using stool softeners and only occasional MiraLAX.  Advised patient to use MiraLAX every 4-6 hours until he is cleared his bowels.  Once he has resolved his constipation the patient was advised to use stool softeners twice daily; and MiraLAX one daily basis.  Also, advised patient to take the oxycodone on an as-needed basis as directed for severe shingles discomfort.

## 2014-02-27 NOTE — Progress Notes (Signed)
SYMPTOM MANAGEMENT CLINIC   HPI: Sean Mitchell. 80 y.o. male diagnosed with malignant pleural mesothelioma.  Patient is status post cisplatin/Alimta chemotherapy regimen.  Patient completed cycle 6 of his cisplatin/Alimta chemotherapy regimen on 02/18/2014.  He is scheduled for restaging scans on 03/03/2014.  Patient called the cancer Center today requesting urgent care visit.  Patient states that he has developed a mild rash to his right mid back that is radiating around his right chest wall.  He denies any pruritus with this rash; does complain of fairly severe pain to the rash.  He states that he is hesitant to use the oxycodone to 2 concerned regarding constipation.  He denies any recent fevers or chills.  He denies any other new symptoms whatsoever.   Rash    CURRENT THERAPY: Upcoming Treatment Dates - LUNG MESOTHELIOMA Pemetrexed / Cisplatin q21d Days with orders from any treatment category:  No upcoming days in selected categories.    Review of Systems  Skin: Positive for rash.    Past Medical History  Diagnosis Date  . Personal history of contact with and (suspected) exposure to asbestos   . Unspecified essential hypertension   . Other and unspecified hyperlipidemia   . Esophageal reflux   . Diverticulosis of colon (without mention of hemorrhage)   . Elevated prostate specific antigen (PSA)   . Osteoarthrosis, unspecified whether generalized or localized, unspecified site   . Anxiety state, unspecified   . Rectal mass   . Shortness of breath     due to fluid    Past Surgical History  Procedure Laterality Date  . Inguinal hernia repair  1989    left  . Colonoscopy w/ polypectomy    . Video assisted thoracoscopy (vats)/decortication Right 09/25/2013    Procedure: VIDEO ASSISTED THORACOSCOPY (VATS)/POSSIBLE DECORTICATION;  Surgeon: Melrose Nakayama, MD;  Location: [place];  Service: Thoracic;  Laterality: Right;  . Pleural effusion drainage Right 09/25/2013     Procedure: DRAINAGE OF PLEURAL EFFUSION;  Surgeon: Melrose Nakayama, MD;  Location: [place];  Service: Thoracic;  Laterality: Right;    has HYPERLIPIDEMIA; PERIPHERAL NEUROPATHY; HYPERTENSION; GERD; DEGENERATIVE JOINT DISEASE; ELEVATED PROSTATE SPECIFIC ANTIGEN; HISTORY OF ASBESTOS EXPOSURE; Hearing loss, conductive, bilateral; Benign paroxysmal positional vertigo; Pneumonia, viral; Pleural effusion, right; CAP (community acquired pneumonia); Pleural effusion; Pneumonia; Malignant pleural mesothelioma; Superficial phlebitis; Superficial venous thrombosis of right upper extremity; Antineoplastic chemotherapy induced anemia(285.3); Hypoalbuminemia; Fatigue; Physical deconditioning; Shingles; and Constipation on his problem list.     is allergic to other.    Medication List       This list is accurate as of: 02/26/14 11:59 PM.  Always use your most recent med list.               aspirin 81 MG tablet  Take 81 mg by mouth daily.     atenolol 25 MG tablet  Commonly known as:  TENORMIN  Take 1 tablet (25 mg total) by mouth daily.     dexamethasone 4 MG tablet  Commonly known as:  DECADRON  Take 4 mg by mouth 2 (two) times daily with a meal. 1 tablet twice daily the day before, the day of, and the day after chemo     EPINEPHrine 0.3 mg/0.3 mL Soaj injection  Commonly known as:  EPIPEN 2-PAK  Inject 0.3 mLs (0.3 mg total) into the muscle once.     esomeprazole 20 MG capsule  Commonly known as:  NEXIUM  Take 20 mg  by mouth daily at 12 noon.     finasteride 5 MG tablet  Commonly known as:  PROSCAR  Take 1 tablet (5 mg total) by mouth as directed. Monday, Wednesday, Friday and Sunday     folic acid 1 MG tablet  Commonly known as:  FOLVITE  Take 1 tablet (1 mg total) by mouth daily.     HYDROcodone-homatropine 5-1.5 MG/5ML syrup  Commonly known as:  HYCODAN  Take 5 mLs by mouth every 6 (six) hours as needed for cough.     ondansetron 8 MG tablet  Commonly known as:  ZOFRAN    Take 1 tablet (8 mg total) by mouth every 8 (eight) hours as needed for nausea or vomiting.     oxyCODONE 5 MG immediate release tablet  Commonly known as:  Oxy IR/ROXICODONE  1 - 2 tabs PO Q 4-6 hours PRN pain.     prochlorperazine 10 MG tablet  Commonly known as:  COMPAZINE  Take 1 tablet (10 mg total) by mouth every 6 (six) hours as needed for nausea or vomiting.     terazosin 10 MG capsule  Commonly known as:  HYTRIN  Take 1 capsule (10 mg total) by mouth every evening.     traMADol 50 MG tablet  Commonly known as:  ULTRAM  Take 1 tablet (50 mg total) by mouth every 6 (six) hours as needed.     valACYclovir 1000 MG tablet  Commonly known as:  VALTREX  Take 1 tablet (1,000 mg total) by mouth 3 (three) times daily.         PHYSICAL EXAMINATION  Blood pressure 141/80, pulse 74, temperature 98.3 F (36.8 C), temperature source Oral, resp. rate 18, height 5\' 8"  (1.727 m), weight 147 lb (66.679 kg).  Physical Exam  Nursing note and vitals reviewed. Constitutional: He is oriented to person, place, and time. Vital signs are normal. He appears unhealthy. He appears cachectic.  HENT:  Head: Normocephalic and atraumatic.  Mouth/Throat: Oropharynx is clear and moist.  Eyes: Conjunctivae and EOM are normal. Pupils are equal, round, and reactive to light. Right eye exhibits no discharge. Left eye exhibits no discharge. No scleral icterus.  Neck: Normal range of motion. Neck supple. No JVD present. No tracheal deviation present. No thyromegaly present.  Cardiovascular: Normal rate, regular rhythm, normal heart sounds and intact distal pulses.   Pulmonary/Chest: Effort normal and breath sounds normal. No respiratory distress. He has no wheezes. He has no rales.  Abdominal: Soft. Bowel sounds are normal. He exhibits no distension. There is no tenderness. There is no rebound.  Musculoskeletal: Normal range of motion. He exhibits no edema and no tenderness.  Lymphadenopathy:    He has  no cervical adenopathy.  Neurological: He is alert and oriented to person, place, and time. Gait normal.  Skin: Skin is warm and dry. Rash noted. No erythema.  Patient has a pink/red, raised rash to his right mid back that is radiating around his right chest wall.    Psychiatric: Affect normal.    LABORATORY DATA:. Appointment on 02/24/2014  Component Date Value Ref Range Status  . WBC 02/24/2014 6.5  4.0 - 10.3 10e3/uL Final  . NEUT# 02/24/2014 4.7  1.5 - 6.5 10e3/uL Final  . HGB 02/24/2014 11.9* 13.0 - 17.1 g/dL Final  . HCT 02/24/2014 36.4* 38.4 - 49.9 % Final  . Platelets 02/24/2014 151  140 - 400 10e3/uL Final  . MCV 02/24/2014 92.9  79.3 - 98.0 fL Final  . MCH  02/24/2014 30.4  27.2 - 33.4 pg Final  . MCHC 02/24/2014 32.7  32.0 - 36.0 g/dL Final  . RBC 02/24/2014 3.92* 4.20 - 5.82 10e6/uL Final  . RDW 02/24/2014 17.7* 11.0 - 14.6 % Final  . lymph# 02/24/2014 1.6  0.9 - 3.3 10e3/uL Final  . MONO# 02/24/2014 0.1  0.1 - 0.9 10e3/uL Final  . Eosinophils Absolute 02/24/2014 0.0  0.0 - 0.5 10e3/uL Final  . Basophils Absolute 02/24/2014 0.0  0.0 - 0.1 10e3/uL Final  . NEUT% 02/24/2014 72.7  39.0 - 75.0 % Final  . LYMPH% 02/24/2014 24.6  14.0 - 49.0 % Final  . MONO% 02/24/2014 1.8  0.0 - 14.0 % Final  . EOS% 02/24/2014 0.7  0.0 - 7.0 % Final  . BASO% 02/24/2014 0.2  0.0 - 2.0 % Final  . Sodium 02/24/2014 138  136 - 145 mEq/L Final  . Potassium 02/24/2014 4.7  3.5 - 5.1 mEq/L Final  . Chloride 02/24/2014 102  98 - 109 mEq/L Final  . CO2 02/24/2014 27  22 - 29 mEq/L Final  . Glucose 02/24/2014 92  70 - 140 mg/dl Final  . BUN 02/24/2014 34.5* 7.0 - 26.0 mg/dL Final  . Creatinine 02/24/2014 1.5* 0.7 - 1.3 mg/dL Final  . Total Bilirubin 02/24/2014 0.31  0.20 - 1.20 mg/dL Final  . Alkaline Phosphatase 02/24/2014 81  40 - 150 U/L Final  . AST 02/24/2014 17  5 - 34 U/L Final  . ALT 02/24/2014 14  0 - 55 U/L Final  . Total Protein 02/24/2014 6.8  6.4 - 8.3 g/dL Final  . Albumin  02/24/2014 3.0* 3.5 - 5.0 g/dL Final  . Calcium 02/24/2014 9.6  8.4 - 10.4 mg/dL Final  . Anion Gap 02/24/2014 8  3 - 11 mEq/L Final     RADIOGRAPHIC STUDIES: No results found.  ASSESSMENT/PLAN:    Malignant pleural mesothelioma  Assessment & Plan Patient completed cycle 6 of his cisplatin/Alimta chemotherapy regimen on 02/18/2014.  He is scheduled for restaging scans on 03/03/2014; and will return for labs and a follow up visit to review the scan results on 03/10/2014.   Shingles  Assessment & Plan Patient states that he did obtain a she will specks in the in a few years ago; but it does appear that patient has a mild shingles rash to his right mid back which is radiating around his right chest wall.  This area is not pruritic; but is quite painful.  Patient will be prescribed valacyclovir 1 g 3 times per day for a total of 7 days.  Patient was also given a refill of his oxycodone today.   Constipation  Assessment & Plan Patient is concerned regarding the use of oxycodone; stating that he experiences chronic constipation issues.  Patient states that he has been using stool softeners and only occasional MiraLAX.  Advised patient to use MiraLAX every 4-6 hours until he is cleared his bowels.  Once he has resolved his constipation the patient was advised to use stool softeners twice daily; and MiraLAX one daily basis.  Also, advised patient to take the oxycodone on an as-needed basis as directed for severe shingles discomfort.   Patient stated understanding of all instructions; and was in agreement with this plan of care. The patient knows to call the clinic with any problems, questions or concerns.   Review/collaboration with Dr. Julien Nordmann regarding all aspects of patient's visit today.   Total time spent with patient was 25 minutes;  with greater than  75 percent of that time spent in face to face counseling regarding his symptoms, and coordination of care and follow up.  Disclaimer:  This note was dictated with voice recognition software. Similar sounding words can inadvertently be transcribed and may not be corrected upon review.   Drue Second, NP 02/27/2014

## 2014-02-27 NOTE — Assessment & Plan Note (Signed)
Patient completed cycle 6 of his cisplatin/Alimta chemotherapy regimen on 02/18/2014.  He is scheduled for restaging scans on 03/03/2014; and will return for labs and a follow up visit to review the scan results on 03/10/2014.

## 2014-03-03 ENCOUNTER — Ambulatory Visit (HOSPITAL_COMMUNITY)
Admission: RE | Admit: 2014-03-03 | Discharge: 2014-03-03 | Disposition: A | Discharge status: Home / Self Care | Payer: Medicare Other | Source: Ambulatory Visit | Attending: Internal Medicine | Admitting: Internal Medicine

## 2014-03-03 ENCOUNTER — Encounter (HOSPITAL_COMMUNITY): Payer: Self-pay

## 2014-03-03 ENCOUNTER — Other Ambulatory Visit: Payer: Medicare Other

## 2014-03-03 ENCOUNTER — Other Ambulatory Visit: Payer: Self-pay | Admitting: Internal Medicine

## 2014-03-03 ENCOUNTER — Other Ambulatory Visit (HOSPITAL_BASED_OUTPATIENT_CLINIC_OR_DEPARTMENT_OTHER): Payer: Medicare Other

## 2014-03-03 DIAGNOSIS — C450 Mesothelioma of pleura: Secondary | ICD-10-CM

## 2014-03-03 DIAGNOSIS — I2699 Other pulmonary embolism without acute cor pulmonale: Secondary | ICD-10-CM | POA: Insufficient documentation

## 2014-03-03 DIAGNOSIS — Z9221 Personal history of antineoplastic chemotherapy: Secondary | ICD-10-CM | POA: Insufficient documentation

## 2014-03-03 LAB — CBC WITH DIFFERENTIAL/PLATELET
BASO%: 0 % (ref 0.0–2.0)
BASOS ABS: 0 10*3/uL (ref 0.0–0.1)
EOS%: 2.7 % (ref 0.0–7.0)
Eosinophils Absolute: 0.1 10*3/uL (ref 0.0–0.5)
HCT: 31.7 % — ABNORMAL LOW (ref 38.4–49.9)
HGB: 10.6 g/dL — ABNORMAL LOW (ref 13.0–17.1)
LYMPH%: 49.3 % — ABNORMAL HIGH (ref 14.0–49.0)
MCH: 30.7 pg (ref 27.2–33.4)
MCHC: 33.4 g/dL (ref 32.0–36.0)
MCV: 91.9 fL (ref 79.3–98.0)
MONO#: 0.3 10*3/uL (ref 0.1–0.9)
MONO%: 11 % (ref 0.0–14.0)
NEUT#: 1.1 10*3/uL — ABNORMAL LOW (ref 1.5–6.5)
NEUT%: 37 % — ABNORMAL LOW (ref 39.0–75.0)
PLATELETS: 75 10*3/uL — AB (ref 140–400)
RBC: 3.45 10*6/uL — AB (ref 4.20–5.82)
RDW: 15.2 % — AB (ref 11.0–14.6)
WBC: 2.9 10*3/uL — AB (ref 4.0–10.3)
lymph#: 1.4 10*3/uL (ref 0.9–3.3)

## 2014-03-03 LAB — COMPREHENSIVE METABOLIC PANEL (CC13)
ALBUMIN: 3.1 g/dL — AB (ref 3.5–5.0)
ALT: 11 U/L (ref 0–55)
ANION GAP: 10 meq/L (ref 3–11)
AST: 15 U/L (ref 5–34)
Alkaline Phosphatase: 98 U/L (ref 40–150)
BUN: 24.3 mg/dL (ref 7.0–26.0)
CO2: 25 mEq/L (ref 22–29)
Calcium: 9.6 mg/dL (ref 8.4–10.4)
Chloride: 103 mEq/L (ref 98–109)
Creatinine: 1.4 mg/dL — ABNORMAL HIGH (ref 0.7–1.3)
GLUCOSE: 98 mg/dL (ref 70–140)
POTASSIUM: 4.8 meq/L (ref 3.5–5.1)
Sodium: 138 mEq/L (ref 136–145)
TOTAL PROTEIN: 6.7 g/dL (ref 6.4–8.3)
Total Bilirubin: 0.25 mg/dL (ref 0.20–1.20)

## 2014-03-03 LAB — MAGNESIUM (CC13): MAGNESIUM: 2 mg/dL (ref 1.5–2.5)

## 2014-03-03 MED ORDER — ENOXAPARIN SODIUM 100 MG/ML ~~LOC~~ SOLN
1.5000 mg/kg | SUBCUTANEOUS | Status: DC
Start: 2014-03-03 — End: 2014-03-10

## 2014-03-03 MED ORDER — IOHEXOL 300 MG/ML  SOLN
80.0000 mL | Freq: Once | INTRAMUSCULAR | Status: AC | PRN
  Administered 2014-03-03: 80 mL via INTRAVENOUS

## 2014-03-03 NOTE — Progress Notes (Signed)
Quick Note:  I called the patient with the result and ordered Lovenox 100 mg subcutaneously every day for 7 days with one refill until we see him next week. ______

## 2014-03-04 ENCOUNTER — Telehealth: Payer: Self-pay | Admitting: *Deleted

## 2014-03-04 NOTE — Telephone Encounter (Signed)
Pt's wife called today stating "we are having a hard time".  He was started on lovenox last night.  He has been having some nausea and vomiting.  He does not have a lot of energy.  She states he feel this morning and bumped his head, however, it was an area she had padded so he hit his head on the padding with just a minor scrape on his cheek.  She states he does not appear to have any other injuries from the fall.  She states he has only been giving him zofran for nausea.  Advised small frequent meals, hydrating, taking compazine and zofran as needed for nausea.  She states that he has been sitting up and getting dizzy.  Advised he sit up and wait a few minutes before standing.  She verbalized understanding.  Advised that if he has any other changes regarding hitting his head he may need to go to the ED for evaluation.  She verbalized understanding.

## 2014-03-04 NOTE — Telephone Encounter (Signed)
-----   Message from Curt Bears, MD sent at 03/03/2014  8:03 PM EST ----- I called the patient with the result and ordered Lovenox 100 mg subcutaneously every day for 7 days with one refill until we see him next week.

## 2014-03-07 ENCOUNTER — Other Ambulatory Visit: Payer: Self-pay | Admitting: Physician Assistant

## 2014-03-07 DIAGNOSIS — C450 Mesothelioma of pleura: Secondary | ICD-10-CM

## 2014-03-10 ENCOUNTER — Encounter: Payer: Self-pay | Admitting: Physician Assistant

## 2014-03-10 ENCOUNTER — Ambulatory Visit (HOSPITAL_BASED_OUTPATIENT_CLINIC_OR_DEPARTMENT_OTHER): Payer: Medicare Other | Admitting: Physician Assistant

## 2014-03-10 ENCOUNTER — Other Ambulatory Visit (HOSPITAL_BASED_OUTPATIENT_CLINIC_OR_DEPARTMENT_OTHER): Payer: Medicare Other

## 2014-03-10 VITALS — BP 161/86 | HR 81 | Temp 97.5°F | Resp 18 | Ht 68.0 in | Wt 144.6 lb

## 2014-03-10 DIAGNOSIS — B029 Zoster without complications: Secondary | ICD-10-CM

## 2014-03-10 DIAGNOSIS — I2699 Other pulmonary embolism without acute cor pulmonale: Secondary | ICD-10-CM

## 2014-03-10 DIAGNOSIS — C450 Mesothelioma of pleura: Secondary | ICD-10-CM

## 2014-03-10 HISTORY — DX: Other pulmonary embolism without acute cor pulmonale: I26.99

## 2014-03-10 LAB — CBC WITH DIFFERENTIAL/PLATELET
BASO%: 0.3 % (ref 0.0–2.0)
Basophils Absolute: 0 10*3/uL (ref 0.0–0.1)
EOS%: 1.6 % (ref 0.0–7.0)
Eosinophils Absolute: 0.1 10*3/uL (ref 0.0–0.5)
HCT: 33.7 % — ABNORMAL LOW (ref 38.4–49.9)
HGB: 11.2 g/dL — ABNORMAL LOW (ref 13.0–17.1)
LYMPH%: 49.7 % — AB (ref 14.0–49.0)
MCH: 31.3 pg (ref 27.2–33.4)
MCHC: 33.2 g/dL (ref 32.0–36.0)
MCV: 94.1 fL (ref 79.3–98.0)
MONO#: 0.8 10*3/uL (ref 0.1–0.9)
MONO%: 25 % — AB (ref 0.0–14.0)
NEUT#: 0.7 10*3/uL — ABNORMAL LOW (ref 1.5–6.5)
NEUT%: 23.4 % — ABNORMAL LOW (ref 39.0–75.0)
PLATELETS: 238 10*3/uL (ref 140–400)
RBC: 3.58 10*6/uL — ABNORMAL LOW (ref 4.20–5.82)
RDW: 16.5 % — ABNORMAL HIGH (ref 11.0–14.6)
WBC: 3.1 10*3/uL — AB (ref 4.0–10.3)
lymph#: 1.5 10*3/uL (ref 0.9–3.3)

## 2014-03-10 LAB — COMPREHENSIVE METABOLIC PANEL (CC13)
ALT: 7 U/L (ref 0–55)
AST: 15 U/L (ref 5–34)
Albumin: 3.1 g/dL — ABNORMAL LOW (ref 3.5–5.0)
Alkaline Phosphatase: 88 U/L (ref 40–150)
Anion Gap: 10 mEq/L (ref 3–11)
BILIRUBIN TOTAL: 0.24 mg/dL (ref 0.20–1.20)
BUN: 25.8 mg/dL (ref 7.0–26.0)
CALCIUM: 9.8 mg/dL (ref 8.4–10.4)
CHLORIDE: 102 meq/L (ref 98–109)
CO2: 26 mEq/L (ref 22–29)
Creatinine: 1.5 mg/dL — ABNORMAL HIGH (ref 0.7–1.3)
Glucose: 100 mg/dl (ref 70–140)
Potassium: 4.1 mEq/L (ref 3.5–5.1)
Sodium: 139 mEq/L (ref 136–145)
Total Protein: 7.2 g/dL (ref 6.4–8.3)

## 2014-03-10 LAB — MAGNESIUM (CC13): Magnesium: 2.1 mg/dl (ref 1.5–2.5)

## 2014-03-10 MED ORDER — RIVAROXABAN (XARELTO) VTE STARTER PACK (15 & 20 MG): ORAL_TABLET | ORAL | Status: DC

## 2014-03-10 MED ORDER — OXYCODONE HCL 5 MG PO TABS: ORAL_TABLET | ORAL | Status: AC

## 2014-03-10 NOTE — Progress Notes (Addendum)
Sean Mitchell [phone]   [phone]  OFFICE PROGRESS NOTE  Sean Man, MD [name]  DIAGNOSIS: Malignant pleural mesothelioma  Primary site: Pleural Mesothelioma (Right)  Staging method: AJCC 7th Edition  Clinical: Stage II (T2, N0, M0) signed by Curt Bears, MD on 10/09/2013 12:58 PM  Summary: Stage II (T2, N0, M0)   PRIOR THERAPY: Status post right VATS with drainage of right pleural effusion and pleural biopsies on 09/25/2013 the care of Dr. Roxan Hockey   CURRENT THERAPY: Systemic chemotherapy with cisplatin 75 mg/m2 and Alimta 500 mg/m2 given every 3 weeks. Status post 6 cycles.  INTERVAL HISTORY: Sean Mitchell. 80 y.o. male returns to the clinic today for followup visit accompanied by his wife and children. He has completed 6 cycles of systemic chemotherapy with cisplatin and Alimta. He recently had a restaging CT scan presents to discuss results. He is had some issues with vertigo and fell approximately a week ago hitting his right cheek and temple area on the hearth. He did not lose consciousness. He continues to complain of pain in the right shoulder blade area. It is often sharp.he reports not being able to walk or exercise due to the severity of the shoulder pain. He has been quite sedentary. He tolerated the last cycle of his systemic chemotherapy with cisplatin and Alimta fairly well. He denied having any significant weight loss or night sweats. He has no fever or chills. He denied having any cough or hemoptysis.of note pulmonary emboli affecting the lingula in the left lower lobe pulmonary arteries or found on his restaging CT scan. He has just completed 7 days of Lovenox injections.  MEDICAL HISTORY: Past Medical History  Diagnosis Date  . Personal history of contact with and (suspected) exposure to asbestos   . Unspecified essential hypertension   . Other and unspecified hyperlipidemia    . Esophageal reflux   . Diverticulosis of colon (without mention of hemorrhage)   . Elevated prostate specific antigen (PSA)   . Osteoarthrosis, unspecified whether generalized or localized, unspecified site   . Anxiety state, unspecified   . Rectal mass   . Shortness of breath     due to fluid  . Pulmonary emboli 03/10/2014    ALLERGIES:  is allergic to other.  MEDICATIONS:  Current Outpatient Prescriptions  Medication Sig Dispense Refill  . atenolol (TENORMIN) 25 MG tablet Take 1 tablet (25 mg total) by mouth daily. 100 tablet 3  . esomeprazole (NEXIUM) 20 MG capsule Take 20 mg by mouth daily at 12 noon.    . finasteride (PROSCAR) 5 MG tablet Take 1 tablet (5 mg total) by mouth as directed. Monday, Wednesday, Friday and Sunday 100 tablet 3  . HYDROcodone-homatropine (HYCODAN) 5-1.5 MG/5ML syrup Take 5 mLs by mouth every 6 (six) hours as needed for cough.    . ondansetron (ZOFRAN) 8 MG tablet Take 1 tablet (8 mg total) by mouth every 8 (eight) hours as needed for nausea or vomiting. 30 tablet 1  . oxyCODONE (OXY IR/ROXICODONE) 5 MG immediate release tablet 1 - 2 tabs PO Q 4-6 hours PRN pain. 60 tablet 0  . prochlorperazine (COMPAZINE) 10 MG tablet Take 1 tablet (10 mg total) by mouth every 6 (six) hours as needed for nausea or vomiting. 60 tablet 1  . terazosin (HYTRIN) 10 MG capsule Take 1 capsule (10 mg total) by mouth every evening. 100 capsule 3  . valACYclovir (VALTREX) 1000  MG tablet Take 1 tablet (1,000 mg total) by mouth 3 (three) times daily. 21 tablet 0  . dexamethasone (DECADRON) 4 MG tablet Take 4 mg by mouth 2 (two) times daily with a meal. 1 tablet twice daily the day before, the day of, and the day after chemo    . EPINEPHrine (EPIPEN 2-PAK) 0.3 mg/0.3 mL DEVI Inject 0.3 mLs (0.3 mg total) into the muscle once. 1 Device 1  . folic acid (FOLVITE) 1 MG tablet Take 1 tablet (1 mg total) by mouth daily. 30 tablet 4  . Rivaroxaban (XARELTO STARTER PACK) 15 & 20 MG TBPK Take  as directed on package: Start with one 15mg  tablet by mouth twice a day with food. On Day 22, switch to one 20mg  tablet once a day with food. 51 each 0  . traMADol (ULTRAM) 50 MG tablet Take 1 tablet (50 mg total) by mouth every 6 (six) hours as needed. 30 tablet 0   No current facility-administered medications for this visit.    SURGICAL HISTORY:  Past Surgical History  Procedure Laterality Date  . Inguinal hernia repair  1989    left  . Colonoscopy w/ polypectomy    . Video assisted thoracoscopy (vats)/decortication Right 09/25/2013    Procedure: VIDEO ASSISTED THORACOSCOPY (VATS)/POSSIBLE DECORTICATION;  Surgeon: Melrose Nakayama, MD;  Location: [facility];  Service: Thoracic;  Laterality: Right;  . Pleural effusion drainage Right 09/25/2013    Procedure: DRAINAGE OF PLEURAL EFFUSION;  Surgeon: Melrose Nakayama, MD;  Location: [facility];  Service: Thoracic;  Laterality: Right;    REVIEW OF SYSTEMS:  A comprehensive review of systems was negative except for: Constitutional: positive for fatigue Respiratory: positive for dyspnea on exertion and pleurisy/chest pain Gastrointestinal: positive for nausea Musculoskeletal: positive for back pain, bone pain and particularly in the area of the right shoulder/scapula   PHYSICAL EXAMINATION: General appearance: alert, cooperative, fatigued and no distress Head: Normocephalic, without obvious abnormality, atraumatic Neck: no adenopathy, no JVD, supple, symmetrical, trachea midline and thyroid not enlarged, symmetric, no tenderness/mass/nodules Lymph nodes: Cervical, supraclavicular, and axillary nodes normal. Resp: clear to auscultation bilaterally Back: symmetric, no curvature. ROM normal. No CVA tenderness. Cardio: regular rate and rhythm, S1, S2 normal, no murmur, click, rub or gallop GI: soft, non-tender; bowel sounds normal; no masses,  no organomegaly Extremities: extremities normal, atraumatic, no cyanosis or edema Neurologic: Alert and  oriented X 3, normal strength and tone. Normal symmetric reflexes. Normal coordination and gait  ECOG PERFORMANCE STATUS: 1 - Symptomatic but completely ambulatory  Blood pressure 161/86, pulse 81, temperature 97.5 F (36.4 C), temperature source Oral, resp. rate 18, height 5\' 8"  (1.727 m), weight 144 lb 9.6 oz (65.59 kg), SpO2 98 %.  LABORATORY DATA: Lab Results  Component Value Date   WBC 3.1* 03/10/2014   HGB 11.2* 03/10/2014   HCT 33.7* 03/10/2014   MCV 94.1 03/10/2014   PLT 238 03/10/2014      Chemistry      Component Value Date/Time   NA 139 03/10/2014 1137   NA 136 12/10/2013 1045   K 4.1 03/10/2014 1137   K 4.1 12/10/2013 1045   CL 101 12/10/2013 1045   CO2 26 03/10/2014 1137   CO2 28 12/10/2013 1045   BUN 25.8 03/10/2014 1137   BUN 21 12/10/2013 1045   CREATININE 1.5* 03/10/2014 1137   CREATININE 1.32 12/10/2013 1045      Component Value Date/Time   CALCIUM 9.8 03/10/2014 1137   CALCIUM 8.6 12/10/2013 1045  ALKPHOS 88 03/10/2014 1137   ALKPHOS 73 12/10/2013 1045   AST 15 03/10/2014 1137   AST 14 12/10/2013 1045   ALT 7 03/10/2014 1137   ALT <8 12/10/2013 1045   BILITOT 0.24 03/10/2014 1137   BILITOT 0.3 12/10/2013 1045       RADIOGRAPHIC STUDIES: Ct Chest W Contrast  03/03/2014   CLINICAL DATA:  Subsequent treatment strategy, Malignant pleural mesothelioma. Chemotherapy completed 2 weeks prior. Right scapular pain.  EXAM: CT CHEST, ABDOMEN, AND PELVIS WITH CONTRAST  TECHNIQUE: Multidetector CT imaging of the chest, abdomen and pelvis was performed following the standard protocol during bolus administration of intravenous contrast.  CONTRAST:  30mL OMNIPAQUE IOHEXOL 300 MG/ML  SOLN  COMPARISON:  CT scan 12/16/2013, PET-CT scan 10/08/2013  FINDINGS: CT CHEST FINDINGS  No axillary or supraclavicular lymphadenopathy. 7 mm right supraclavicular lymph node is not changed from prior.  There several enlarged prevascular lymph nodes which are not increased in size  compared prior. Largest measures 8 mm image 20, series 2 compared to 9 mm on prior. No pericardia fluidl.  There is a rim of nodularity within the right pleural space not changed from prior. Partially loculated pleural fluid collection the right lower lobe measuring 4.3 x 2.9 cm compared to 6.0 x 2.7 cm prior. Review of the lung parenchyma demonstrates nodularity along oblique fissure (image 36) not changed prior. No new nodularity.  Left lung is clear.  While not optimized for evaluating pulmonary arteries, there are filling defects within the lingula and left lower lobe pulmonary arteries consistent pulmonary embolism. These are new from comparison CT of 12/16/2013  CT ABDOMEN AND PELVIS FINDINGS  Hepatobiliary: No focal hepatic lesion.  Gallbladder is normal.  Pancreas: Pancreas is normal. No duct dilatation. No pancreatic inflammation.  Spleen: Normal spleen.  Adrenals/Urinary Tract: Adrenal glands are normal. There are bilateral low-density renal lesions which likely represent benign cysts. These are not changed prior.  Stomach/Bowel: Stomach, small bowel, appendix, cecum are normal. The colon and rectosigmoid colon are normal.  Vascular/Lymphatic: Abdominal aorta is normal caliber. There is no retroperitoneal or periportal lymphadenopathy. No pelvic lymphadenopathy.  Reproductive: Prostate gland is normal.  Other: There is no evidence of peritoneal nodularity to suggests tumor extension int oabdominal cavity.  Musculoskeletal: No aggressive osseous lesion.  IMPRESSION: Chest Impression:  1. No evidence of progression of mesothelioma in the right hemi thorax. Persistent rind of thickened pleural tissue and loculated fluid at the right lung base. 2. Mild anterior mediastinal adenopathy is not progressed from prior. 3. Pulmonary emboli within the lingula and left lower lobe are new from CT of 12/16/2013. Critical Value/emergent results were called by telephone at the time of interpretation on 03/03/2014 at 3:38 pm  to Dr. Curt Bears , who verbally acknowledged these results.  Abdomen / Pelvis Impression:  1. No evidence of mesothelioma within the peritoneal space. 2. No skeletal metastasis.   Electronically Signed   By: Suzy Bouchard M.D.   On: 03/03/2014 15:44   Ct Abdomen Pelvis W Contrast  03/03/2014   CLINICAL DATA:  Subsequent treatment strategy, Malignant pleural mesothelioma. Chemotherapy completed 2 weeks prior. Right scapular pain.  EXAM: CT CHEST, ABDOMEN, AND PELVIS WITH CONTRAST  TECHNIQUE: Multidetector CT imaging of the chest, abdomen and pelvis was performed following the standard protocol during bolus administration of intravenous contrast.  CONTRAST:  61mL OMNIPAQUE IOHEXOL 300 MG/ML  SOLN  COMPARISON:  CT scan 12/16/2013, PET-CT scan 10/08/2013  FINDINGS: CT CHEST FINDINGS  No axillary or  supraclavicular lymphadenopathy. 7 mm right supraclavicular lymph node is not changed from prior.  There several enlarged prevascular lymph nodes which are not increased in size compared prior. Largest measures 8 mm image 20, series 2 compared to 9 mm on prior. No pericardia fluidl.  There is a rim of nodularity within the right pleural space not changed from prior. Partially loculated pleural fluid collection the right lower lobe measuring 4.3 x 2.9 cm compared to 6.0 x 2.7 cm prior. Review of the lung parenchyma demonstrates nodularity along oblique fissure (image 36) not changed prior. No new nodularity.  Left lung is clear.  While not optimized for evaluating pulmonary arteries, there are filling defects within the lingula and left lower lobe pulmonary arteries consistent pulmonary embolism. These are new from comparison CT of 12/16/2013  CT ABDOMEN AND PELVIS FINDINGS  Hepatobiliary: No focal hepatic lesion.  Gallbladder is normal.  Pancreas: Pancreas is normal. No duct dilatation. No pancreatic inflammation.  Spleen: Normal spleen.  Adrenals/Urinary Tract: Adrenal glands are normal. There are bilateral  low-density renal lesions which likely represent benign cysts. These are not changed prior.  Stomach/Bowel: Stomach, small bowel, appendix, cecum are normal. The colon and rectosigmoid colon are normal.  Vascular/Lymphatic: Abdominal aorta is normal caliber. There is no retroperitoneal or periportal lymphadenopathy. No pelvic lymphadenopathy.  Reproductive: Prostate gland is normal.  Other: There is no evidence of peritoneal nodularity to suggests tumor extension int oabdominal cavity.  Musculoskeletal: No aggressive osseous lesion.  IMPRESSION: Chest Impression:  1. No evidence of progression of mesothelioma in the right hemi thorax. Persistent rind of thickened pleural tissue and loculated fluid at the right lung base. 2. Mild anterior mediastinal adenopathy is not progressed from prior. 3. Pulmonary emboli within the lingula and left lower lobe are new from CT of 12/16/2013. Critical Value/emergent results were called by telephone at the time of interpretation on 03/03/2014 at 3:38 pm to Dr. Curt Bears , who verbally acknowledged these results.  Abdomen / Pelvis Impression:  1. No evidence of mesothelioma within the peritoneal space. 2. No skeletal metastasis.   Electronically Signed   By: Suzy Bouchard M.D.   On: 03/03/2014 15:44   ASSESSMENT AND PLAN: This is a very pleasant 80 years old white male recently diagnosed with malignant pleural mesothelioma involving the right lung and currently undergoing systemic chemotherapy with cisplatin and Alimta status post 6 cycles.overall he tolerated his course of chemotherapy relatively well with the exception of pain in the right chest wall area, right shoulder area and shortness of breath with exertion. His restaging CT scan revealed no evidence for mesothelioma within the peritoneal space and no skeletal metastasis. These results were reviewed with the patient and his family members. Patient was discussed with and also seen by Dr. Julien Nordmann. For his recent  pulmonary emboli patient will be changed from Lovenox injections to Xarelto. A prescription for the Xarelto starter pack was sent to his pharmacy of record via E scribed.once he comes close to completing his start of pack, a prescription for Xarelto 20 mg by mouth daily will be sent to his pharmacy of record. As planned for him to complete at least 6 months of anticoagulation. His ANC is slightly low at 0.7 however his total white count is 3.1. Neutropenic precautions were reviewed with patient and his family members and all voiced understanding.Patient will be placed on observation and return in 3 months with a restaging CT scan of the chest, abdomen and pelvis to reevaluate his disease.  He was advised to call me immediately if he has any concerning symptoms in the interval. The patient voices understanding of current disease status and treatment options and is in agreement with the current care plan.  All questions were answered. The patient knows to call the clinic with any problems, questions or concerns. We can certainly see the patient much sooner if necessary.  Carlton Adam PA-C  ADDENDUM: Hematology/Oncology Attending: I had a face to face encounter with the patient. I recommended his care plan. This is a very pleasant 80 years old white male with history of malignant pleural mesothelioma status post 6 cycles of systemic chemotherapy with cisplatin and Alimta. He tolerated his treatment fairly well except for fatigue. The patient had repeat CT scan of the chest, abdomen and pelvis that showed no evidence for disease progression. I discussed the scan results with the patient and his family. I recommended for the patient to continue on observation for now with close monitoring and repeat CT scan of the chest, abdomen and pelvis in 3 months. He was recently found on the restaging scan to have pulmonary embolism and the patient was started initially on Lovenox 1.5 MG/KG subcutaneously on  daily basis. We discussed with the patient switching his anticoagulation to Xarelto. He will be started on Xarelto to 15 mg by mouth twice a day for 3 weeks followed by 20 mg by mouth daily. The patient was advised to call immediately if he has any concerning symptoms in the interval.  Disclaimer: This note was dictated with voice recognition software. Similar sounding words can inadvertently be transcribed and may not be corrected upon review. Eilleen Kempf., MD 03/15/2014

## 2014-03-11 ENCOUNTER — Telehealth: Payer: Self-pay | Admitting: Internal Medicine

## 2014-03-11 ENCOUNTER — Encounter: Payer: Self-pay | Admitting: Internal Medicine

## 2014-03-11 ENCOUNTER — Encounter: Payer: Self-pay | Admitting: *Deleted

## 2014-03-11 NOTE — Telephone Encounter (Signed)
s.w. pt wife and advised on NOV and Feb 2016 appt....ok and aware

## 2014-03-11 NOTE — Progress Notes (Signed)
Faxed xarelto pa form to  [facility]

## 2014-03-11 NOTE — Progress Notes (Signed)
RECEIVED A FAX FROM [facility] CONCERNING A PRIOR AUTHORIZATION FOR XARELTO. THIS REQUEST WAS PLACED IN THE MANAGED CARE BIN.

## 2014-03-12 ENCOUNTER — Encounter: Payer: Self-pay | Admitting: Internal Medicine

## 2014-03-12 NOTE — Progress Notes (Signed)
[facility], [id number], approved xarelto starter pack from 03/11/14-03/12/15

## 2014-03-12 NOTE — Patient Instructions (Signed)
Your recent restaging CT scan showed further improvement in your disease. For the pulmonary emboli (blood clots in your lungs) you will start treatment with Xarelto intake as instructed Follow-up in 3 months with a restaging CT scan of the chest, abdomen and pelvis to reevaluate your disease

## 2014-03-17 ENCOUNTER — Other Ambulatory Visit (HOSPITAL_BASED_OUTPATIENT_CLINIC_OR_DEPARTMENT_OTHER): Payer: Medicare Other

## 2014-03-17 DIAGNOSIS — C450 Mesothelioma of pleura: Secondary | ICD-10-CM

## 2014-03-17 LAB — CBC WITH DIFFERENTIAL/PLATELET
BASO%: 0.3 % (ref 0.0–2.0)
BASOS ABS: 0 10*3/uL (ref 0.0–0.1)
EOS%: 0.7 % (ref 0.0–7.0)
Eosinophils Absolute: 0.1 10*3/uL (ref 0.0–0.5)
HCT: 33.3 % — ABNORMAL LOW (ref 38.4–49.9)
HGB: 11 g/dL — ABNORMAL LOW (ref 13.0–17.1)
LYMPH%: 25.4 % (ref 14.0–49.0)
MCH: 31.1 pg (ref 27.2–33.4)
MCHC: 33 g/dL (ref 32.0–36.0)
MCV: 94.1 fL (ref 79.3–98.0)
MONO#: 0.8 10*3/uL (ref 0.1–0.9)
MONO%: 10.9 % (ref 0.0–14.0)
NEUT#: 4.6 10*3/uL (ref 1.5–6.5)
NEUT%: 62.7 % (ref 39.0–75.0)
PLATELETS: 209 10*3/uL (ref 140–400)
RBC: 3.54 10*6/uL — ABNORMAL LOW (ref 4.20–5.82)
RDW: 16.5 % — AB (ref 11.0–14.6)
WBC: 7.3 10*3/uL (ref 4.0–10.3)
lymph#: 1.9 10*3/uL (ref 0.9–3.3)

## 2014-03-31 ENCOUNTER — Telehealth: Payer: Self-pay | Admitting: *Deleted

## 2014-03-31 NOTE — Telephone Encounter (Addendum)
Pt's wife called concerned about patient.  He is very fatigued and is sleeping a lot.  He is not in any acute distress.  Informed her that pt needs to rest, eat well and try to do activity as tolerated.  If anything acutely does come up they need to call us back.  Pt's wife expressed concerns for 16 minutes.

## 2014-04-03 ENCOUNTER — Telehealth: Payer: Self-pay | Admitting: Nutrition

## 2014-04-03 NOTE — Telephone Encounter (Signed)
Patient was identified to be at risk for malnutrition on the MST secondary to weight loss and poor appetite. Contacted patient by phone and spoke with his wife. Patient is sleeping. Wife requested I call patient back on Friday, December 4.

## 2014-04-04 ENCOUNTER — Telehealth: Payer: Self-pay | Admitting: Nutrition

## 2014-04-04 NOTE — Telephone Encounter (Signed)
Contacted patient by phone secondary to positive risk for malnutrition.  Patient reports poor appetite, and taste alterations. He has occasional nausea but nausea medication resolved his nausea. Patient has tried oral nutrition supplements however they tend to be a bit too sweet.  Educated patient to consume small, frequent meals and snacks throughout the day. Reviewed high protein foods with patient. Recommended patient try strategies for improving taste. Will mail fact sheets on increasing calories and protein, poor appetite, and taste alterations.  Also provided patient with coupons. Patient to call with further questions or concerns.  **Disclaimer: This note was dictated with voice recognition software. Similar sounding words can inadvertently be transcribed and this note may contain transcription errors which may not have been corrected upon publication of note.**

## 2014-04-07 ENCOUNTER — Other Ambulatory Visit: Payer: Self-pay | Admitting: Internal Medicine

## 2014-04-07 ENCOUNTER — Other Ambulatory Visit: Payer: Self-pay | Admitting: Medical Oncology

## 2014-04-07 DIAGNOSIS — I2699 Other pulmonary embolism without acute cor pulmonale: Secondary | ICD-10-CM

## 2014-04-07 MED ORDER — RIVAROXABAN 20 MG PO TABS: 20.0000 mg | ORAL_TABLET | Freq: Every day | ORAL | Status: AC

## 2014-04-14 DIAGNOSIS — Z0279 Encounter for issue of other medical certificate: Secondary | ICD-10-CM

## 2014-04-16 ENCOUNTER — Emergency Department (HOSPITAL_COMMUNITY): Payer: Medicare Other

## 2014-04-16 ENCOUNTER — Telehealth: Payer: Self-pay | Admitting: *Deleted

## 2014-04-16 ENCOUNTER — Inpatient Hospital Stay (HOSPITAL_COMMUNITY)
Admission: EM | Admit: 2014-04-16 | Discharge: 2014-05-02 | DRG: 193 | Disposition: E | Payer: Medicare Other | Attending: Internal Medicine | Admitting: Internal Medicine

## 2014-04-16 ENCOUNTER — Other Ambulatory Visit: Payer: Self-pay | Admitting: *Deleted

## 2014-04-16 ENCOUNTER — Encounter (HOSPITAL_COMMUNITY): Payer: Self-pay | Admitting: *Deleted

## 2014-04-16 ENCOUNTER — Ambulatory Visit (HOSPITAL_BASED_OUTPATIENT_CLINIC_OR_DEPARTMENT_OTHER): Payer: Medicare Other

## 2014-04-16 ENCOUNTER — Ambulatory Visit (HOSPITAL_BASED_OUTPATIENT_CLINIC_OR_DEPARTMENT_OTHER): Payer: Medicare Other | Admitting: Nurse Practitioner

## 2014-04-16 ENCOUNTER — Other Ambulatory Visit: Payer: Self-pay

## 2014-04-16 VITALS — BP 78/46 | HR 82 | Temp 98.0°F | Resp 20 | Ht 68.0 in

## 2014-04-16 DIAGNOSIS — I10 Essential (primary) hypertension: Secondary | ICD-10-CM | POA: Diagnosis present

## 2014-04-16 DIAGNOSIS — C450 Mesothelioma of pleura: Secondary | ICD-10-CM

## 2014-04-16 DIAGNOSIS — G609 Hereditary and idiopathic neuropathy, unspecified: Secondary | ICD-10-CM | POA: Diagnosis present

## 2014-04-16 DIAGNOSIS — J189 Pneumonia, unspecified organism: Secondary | ICD-10-CM | POA: Diagnosis present

## 2014-04-16 DIAGNOSIS — J218 Acute bronchiolitis due to other specified organisms: Secondary | ICD-10-CM | POA: Diagnosis present

## 2014-04-16 DIAGNOSIS — F419 Anxiety disorder, unspecified: Secondary | ICD-10-CM

## 2014-04-16 DIAGNOSIS — N183 Chronic kidney disease, stage 3 (moderate): Secondary | ICD-10-CM | POA: Diagnosis present

## 2014-04-16 DIAGNOSIS — I129 Hypertensive chronic kidney disease with stage 1 through stage 4 chronic kidney disease, or unspecified chronic kidney disease: Secondary | ICD-10-CM | POA: Diagnosis present

## 2014-04-16 DIAGNOSIS — Z66 Do not resuscitate: Secondary | ICD-10-CM | POA: Diagnosis not present

## 2014-04-16 DIAGNOSIS — E860 Dehydration: Secondary | ICD-10-CM | POA: Diagnosis present

## 2014-04-16 DIAGNOSIS — R918 Other nonspecific abnormal finding of lung field: Secondary | ICD-10-CM

## 2014-04-16 DIAGNOSIS — Z6822 Body mass index (BMI) 22.0-22.9, adult: Secondary | ICD-10-CM | POA: Diagnosis not present

## 2014-04-16 DIAGNOSIS — G893 Neoplasm related pain (acute) (chronic): Secondary | ICD-10-CM | POA: Diagnosis present

## 2014-04-16 DIAGNOSIS — M1990 Unspecified osteoarthritis, unspecified site: Secondary | ICD-10-CM | POA: Diagnosis present

## 2014-04-16 DIAGNOSIS — J9600 Acute respiratory failure, unspecified whether with hypoxia or hypercapnia: Secondary | ICD-10-CM

## 2014-04-16 DIAGNOSIS — Z79899 Other long term (current) drug therapy: Secondary | ICD-10-CM

## 2014-04-16 DIAGNOSIS — Z800 Family history of malignant neoplasm of digestive organs: Secondary | ICD-10-CM | POA: Diagnosis not present

## 2014-04-16 DIAGNOSIS — J219 Acute bronchiolitis, unspecified: Secondary | ICD-10-CM | POA: Diagnosis present

## 2014-04-16 DIAGNOSIS — Z8249 Family history of ischemic heart disease and other diseases of the circulatory system: Secondary | ICD-10-CM

## 2014-04-16 DIAGNOSIS — Z7901 Long term (current) use of anticoagulants: Secondary | ICD-10-CM | POA: Diagnosis not present

## 2014-04-16 DIAGNOSIS — D638 Anemia in other chronic diseases classified elsewhere: Secondary | ICD-10-CM | POA: Diagnosis present

## 2014-04-16 DIAGNOSIS — R0602 Shortness of breath: Secondary | ICD-10-CM

## 2014-04-16 DIAGNOSIS — E872 Acidosis: Secondary | ICD-10-CM | POA: Diagnosis present

## 2014-04-16 DIAGNOSIS — K5730 Diverticulosis of large intestine without perforation or abscess without bleeding: Secondary | ICD-10-CM | POA: Diagnosis present

## 2014-04-16 DIAGNOSIS — Z79891 Long term (current) use of opiate analgesic: Secondary | ICD-10-CM

## 2014-04-16 DIAGNOSIS — I2699 Other pulmonary embolism without acute cor pulmonale: Secondary | ICD-10-CM | POA: Diagnosis present

## 2014-04-16 DIAGNOSIS — K219 Gastro-esophageal reflux disease without esophagitis: Secondary | ICD-10-CM | POA: Diagnosis present

## 2014-04-16 DIAGNOSIS — N400 Enlarged prostate without lower urinary tract symptoms: Secondary | ICD-10-CM | POA: Diagnosis present

## 2014-04-16 DIAGNOSIS — R531 Weakness: Secondary | ICD-10-CM

## 2014-04-16 DIAGNOSIS — J90 Pleural effusion, not elsewhere classified: Secondary | ICD-10-CM

## 2014-04-16 DIAGNOSIS — R040 Epistaxis: Secondary | ICD-10-CM | POA: Diagnosis not present

## 2014-04-16 DIAGNOSIS — E43 Unspecified severe protein-calorie malnutrition: Secondary | ICD-10-CM | POA: Diagnosis present

## 2014-04-16 DIAGNOSIS — I959 Hypotension, unspecified: Secondary | ICD-10-CM

## 2014-04-16 DIAGNOSIS — K649 Unspecified hemorrhoids: Secondary | ICD-10-CM | POA: Diagnosis present

## 2014-04-16 DIAGNOSIS — F411 Generalized anxiety disorder: Secondary | ICD-10-CM | POA: Diagnosis present

## 2014-04-16 DIAGNOSIS — I2782 Chronic pulmonary embolism: Secondary | ICD-10-CM | POA: Diagnosis present

## 2014-04-16 DIAGNOSIS — N179 Acute kidney failure, unspecified: Secondary | ICD-10-CM | POA: Diagnosis present

## 2014-04-16 DIAGNOSIS — I248 Other forms of acute ischemic heart disease: Secondary | ICD-10-CM | POA: Diagnosis present

## 2014-04-16 DIAGNOSIS — J9601 Acute respiratory failure with hypoxia: Secondary | ICD-10-CM | POA: Diagnosis present

## 2014-04-16 DIAGNOSIS — R0600 Dyspnea, unspecified: Secondary | ICD-10-CM

## 2014-04-16 DIAGNOSIS — Z7902 Long term (current) use of antithrombotics/antiplatelets: Secondary | ICD-10-CM

## 2014-04-16 DIAGNOSIS — N289 Disorder of kidney and ureter, unspecified: Secondary | ICD-10-CM

## 2014-04-16 DIAGNOSIS — E785 Hyperlipidemia, unspecified: Secondary | ICD-10-CM | POA: Diagnosis present

## 2014-04-16 DIAGNOSIS — R5382 Chronic fatigue, unspecified: Secondary | ICD-10-CM

## 2014-04-16 DIAGNOSIS — R5381 Other malaise: Secondary | ICD-10-CM | POA: Diagnosis present

## 2014-04-16 DIAGNOSIS — E8809 Other disorders of plasma-protein metabolism, not elsewhere classified: Secondary | ICD-10-CM

## 2014-04-16 HISTORY — DX: Mesothelioma, unspecified: C45.9

## 2014-04-16 HISTORY — DX: Malignant (primary) neoplasm, unspecified: C80.1

## 2014-04-16 LAB — COMPREHENSIVE METABOLIC PANEL (CC13)
ALBUMIN: 2.5 g/dL — AB (ref 3.5–5.0)
ALK PHOS: 97 U/L (ref 40–150)
ALT: 9 U/L (ref 0–55)
AST: 23 U/L (ref 5–34)
Anion Gap: 12 mEq/L — ABNORMAL HIGH (ref 3–11)
BILIRUBIN TOTAL: 0.37 mg/dL (ref 0.20–1.20)
BUN: 30.3 mg/dL — ABNORMAL HIGH (ref 7.0–26.0)
CO2: 24 mEq/L (ref 22–29)
Calcium: 9.8 mg/dL (ref 8.4–10.4)
Chloride: 102 mEq/L (ref 98–109)
Creatinine: 1.5 mg/dL — ABNORMAL HIGH (ref 0.7–1.3)
EGFR: 43 mL/min/{1.73_m2} — ABNORMAL LOW (ref >90)
GLUCOSE: 148 mg/dL — AB (ref 70–140)
POTASSIUM: 4.3 meq/L (ref 3.5–5.1)
SODIUM: 138 meq/L (ref 136–145)
TOTAL PROTEIN: 7.1 g/dL (ref 6.4–8.3)

## 2014-04-16 LAB — CBC WITH DIFFERENTIAL/PLATELET
BASO%: 0.4 % (ref 0.0–2.0)
Basophils Absolute: 0 10*3/uL (ref 0.0–0.1)
EOS ABS: 0.1 10*3/uL (ref 0.0–0.5)
EOS%: 1.4 % (ref 0.0–7.0)
HCT: 34.2 % — ABNORMAL LOW (ref 38.4–49.9)
HGB: 10.9 g/dL — ABNORMAL LOW (ref 13.0–17.1)
LYMPH%: 11.3 % — ABNORMAL LOW (ref 14.0–49.0)
MCH: 29.8 pg (ref 27.2–33.4)
MCHC: 32 g/dL (ref 32.0–36.0)
MCV: 93.2 fL (ref 79.3–98.0)
MONO#: 0.6 10*3/uL (ref 0.1–0.9)
MONO%: 7.7 % (ref 0.0–14.0)
NEUT%: 79.2 % — ABNORMAL HIGH (ref 39.0–75.0)
NEUTROS ABS: 6.6 10*3/uL — AB (ref 1.5–6.5)
Platelets: 272 10*3/uL (ref 140–400)
RBC: 3.66 10*6/uL — AB (ref 4.20–5.82)
RDW: 15.9 % — AB (ref 11.0–14.6)
WBC: 8.3 10*3/uL (ref 4.0–10.3)
lymph#: 0.9 10*3/uL (ref 0.9–3.3)

## 2014-04-16 LAB — I-STAT CG4 LACTIC ACID, ED: LACTIC ACID, VENOUS: 2.55 mmol/L — AB (ref 0.5–2.2)

## 2014-04-16 LAB — HEPATIC FUNCTION PANEL
ALBUMIN: 2.8 g/dL — AB (ref 3.5–5.2)
ALK PHOS: 110 U/L (ref 39–117)
ALT: 9 U/L (ref 0–53)
AST: 25 U/L (ref 0–37)
BILIRUBIN TOTAL: 0.2 mg/dL — AB (ref 0.3–1.2)
Bilirubin, Direct: <0.2 mg/dL (ref 0.0–0.3)
TOTAL PROTEIN: 7.6 g/dL (ref 6.0–8.3)

## 2014-04-16 LAB — TSH: TSH: 3.01 u[IU]/mL (ref 0.350–4.500)

## 2014-04-16 LAB — PRO B NATRIURETIC PEPTIDE: Pro B Natriuretic peptide (BNP): 326.8 pg/mL (ref 0–450)

## 2014-04-16 LAB — MAGNESIUM: Magnesium: 2.2 mg/dL (ref 1.5–2.5)

## 2014-04-16 LAB — I-STAT TROPONIN, ED: Troponin i, poc: 0 ng/mL (ref 0.00–0.08)

## 2014-04-16 LAB — PHOSPHORUS: Phosphorus: 3.9 mg/dL (ref 2.3–4.6)

## 2014-04-16 LAB — MRSA PCR SCREENING: MRSA by PCR: NEGATIVE

## 2014-04-16 MED ORDER — ENSURE COMPLETE PO LIQD
237.0000 mL | Freq: Two times a day (BID) | ORAL | Status: DC
  Administered 2014-04-17 – 2014-04-18 (×3): 237 mL via ORAL

## 2014-04-16 MED ORDER — CHLORHEXIDINE GLUCONATE 0.12 % MT SOLN
15.0000 mL | Freq: Two times a day (BID) | OROMUCOSAL | Status: DC
  Administered 2014-04-16 – 2014-04-26 (×19): 15 mL via OROMUCOSAL
  Filled 2014-04-16 (×24): qty 15

## 2014-04-16 MED ORDER — PIPERACILLIN-TAZOBACTAM 3.375 G IVPB
3.3750 g | Freq: Three times a day (TID) | INTRAVENOUS | Status: DC
  Administered 2014-04-17 – 2014-04-20 (×10): 3.375 g via INTRAVENOUS
  Filled 2014-04-16 (×13): qty 50

## 2014-04-16 MED ORDER — TERAZOSIN HCL 5 MG PO CAPS
10.0000 mg | ORAL_CAPSULE | Freq: Every evening | ORAL | Status: DC
  Administered 2014-04-17 – 2014-04-26 (×10): 10 mg via ORAL
  Filled 2014-04-16 (×13): qty 2

## 2014-04-16 MED ORDER — SODIUM CHLORIDE 0.9 % IV SOLN
INTRAVENOUS | Status: DC
Start: 2014-04-16 — End: 2014-04-18
  Administered 2014-04-16 – 2014-04-17 (×3): via INTRAVENOUS

## 2014-04-16 MED ORDER — ACETAMINOPHEN 650 MG RE SUPP: 650.0000 mg | Freq: Four times a day (QID) | RECTAL | Status: DC | PRN

## 2014-04-16 MED ORDER — GUAIFENESIN ER 600 MG PO TB12
600.0000 mg | ORAL_TABLET | Freq: Two times a day (BID) | ORAL | Status: DC
Start: 2014-04-16 — End: 2014-04-20
  Administered 2014-04-16 – 2014-04-20 (×8): 600 mg via ORAL
  Filled 2014-04-16 (×9): qty 1

## 2014-04-16 MED ORDER — FINASTERIDE 5 MG PO TABS
5.0000 mg | ORAL_TABLET | ORAL | Status: DC
  Administered 2014-04-16 – 2014-04-25 (×6): 5 mg via ORAL
  Filled 2014-04-16 (×7): qty 1

## 2014-04-16 MED ORDER — VANCOMYCIN HCL IN DEXTROSE 1-5 GM/200ML-% IV SOLN
1000.0000 mg | INTRAVENOUS | Status: DC
  Administered 2014-04-17: 1000 mg via INTRAVENOUS
  Filled 2014-04-16 (×2): qty 200

## 2014-04-16 MED ORDER — HYDROCODONE-HOMATROPINE 5-1.5 MG/5ML PO SYRP
5.0000 mL | ORAL_SOLUTION | Freq: Four times a day (QID) | ORAL | Status: DC | PRN
Start: 2014-04-16 — End: 2014-04-27

## 2014-04-16 MED ORDER — VANCOMYCIN HCL IN DEXTROSE 1-5 GM/200ML-% IV SOLN
1000.0000 mg | Freq: Once | INTRAVENOUS | Status: AC
  Administered 2014-04-16: 1000 mg via INTRAVENOUS
  Filled 2014-04-16: qty 200

## 2014-04-16 MED ORDER — CETYLPYRIDINIUM CHLORIDE 0.05 % MT LIQD
7.0000 mL | Freq: Two times a day (BID) | OROMUCOSAL | Status: DC
  Administered 2014-04-17 – 2014-04-26 (×20): 7 mL via OROMUCOSAL

## 2014-04-16 MED ORDER — IOHEXOL 350 MG/ML SOLN
100.0000 mL | Freq: Once | INTRAVENOUS | Status: AC | PRN
  Administered 2014-04-16: 100 mL via INTRAVENOUS

## 2014-04-16 MED ORDER — SODIUM CHLORIDE 0.9 % IV SOLN
INTRAVENOUS | Status: AC
  Administered 2014-04-16: 17:00:00 via INTRAVENOUS

## 2014-04-16 MED ORDER — OXYCODONE HCL 5 MG PO TABS
5.0000 mg | ORAL_TABLET | Freq: Four times a day (QID) | ORAL | Status: DC | PRN
  Administered 2014-04-16: 10 mg via ORAL
  Administered 2014-04-17 – 2014-04-19 (×3): 5 mg via ORAL
  Administered 2014-04-21: 10 mg via ORAL
  Filled 2014-04-16 (×3): qty 2
  Filled 2014-04-16 (×2): qty 1

## 2014-04-16 MED ORDER — FOLIC ACID 1 MG PO TABS
1.0000 mg | ORAL_TABLET | Freq: Every day | ORAL | Status: DC
  Administered 2014-04-16 – 2014-04-26 (×11): 1 mg via ORAL
  Filled 2014-04-16 (×11): qty 1

## 2014-04-16 MED ORDER — VALACYCLOVIR HCL 500 MG PO TABS: 1000.0000 mg | ORAL_TABLET | Freq: Three times a day (TID) | ORAL | Status: DC

## 2014-04-16 MED ORDER — BUDESONIDE 0.25 MG/2ML IN SUSP
0.2500 mg | Freq: Two times a day (BID) | RESPIRATORY_TRACT | Status: DC
  Administered 2014-04-16 – 2014-04-25 (×17): 0.25 mg via RESPIRATORY_TRACT
  Filled 2014-04-16 (×18): qty 2

## 2014-04-16 MED ORDER — IPRATROPIUM-ALBUTEROL 0.5-2.5 (3) MG/3ML IN SOLN
3.0000 mL | Freq: Four times a day (QID) | RESPIRATORY_TRACT | Status: DC | PRN
  Administered 2014-04-25: 3 mL via RESPIRATORY_TRACT
  Filled 2014-04-16: qty 3

## 2014-04-16 MED ORDER — SODIUM CHLORIDE 0.9 % IV BOLUS (SEPSIS)
1000.0000 mL | Freq: Once | INTRAVENOUS | Status: AC
  Administered 2014-04-16: 1000 mL via INTRAVENOUS

## 2014-04-16 MED ORDER — PIPERACILLIN-TAZOBACTAM 3.375 G IVPB 30 MIN
3.3750 g | Freq: Once | INTRAVENOUS | Status: AC
  Administered 2014-04-16: 3.375 g via INTRAVENOUS
  Filled 2014-04-16: qty 50

## 2014-04-16 MED ORDER — ONDANSETRON HCL 4 MG/2ML IJ SOLN: 4.0000 mg | Freq: Four times a day (QID) | INTRAMUSCULAR | Status: DC | PRN

## 2014-04-16 MED ORDER — RIVAROXABAN 20 MG PO TABS
20.0000 mg | ORAL_TABLET | Freq: Every day | ORAL | Status: DC
  Administered 2014-04-17: 20 mg via ORAL
  Filled 2014-04-16 (×2): qty 1

## 2014-04-16 MED ORDER — ACETAMINOPHEN 325 MG PO TABS: 650.0000 mg | ORAL_TABLET | Freq: Four times a day (QID) | ORAL | Status: DC | PRN

## 2014-04-16 MED ORDER — ONDANSETRON HCL 4 MG PO TABS: 4.0000 mg | ORAL_TABLET | Freq: Four times a day (QID) | ORAL | Status: DC | PRN

## 2014-04-16 MED ORDER — PANTOPRAZOLE SODIUM 40 MG PO TBEC
40.0000 mg | DELAYED_RELEASE_TABLET | Freq: Every day | ORAL | Status: DC
  Administered 2014-04-17 – 2014-04-26 (×10): 40 mg via ORAL
  Filled 2014-04-16 (×10): qty 1

## 2014-04-16 NOTE — Progress Notes (Signed)
ANTIBIOTIC CONSULT NOTE - INITIAL  Pharmacy Consult for Vancomycin and Zosyn Indication: HCAP  Allergies  Allergen Reactions  . Other Swelling and Rash    Yellow Jackets    Patient Measurements: Height: 5\' 8"  (172.7 cm) Weight: 138 lb (62.596 kg) IBW/kg (Calculated) : 68.4  Vital Signs: Temp: 98.3 F (36.8 C) (12/16 1814) Temp Source: Oral (12/16 1814) BP: 132/78 mmHg (12/16 1814) Pulse Rate: 75 (12/16 1814) Intake/Output from previous day:   Intake/Output from this shift:    Labs:  Recent Labs  04/25/2014 1121 04/13/2014 1121  WBC 8.3  --   HGB 10.9*  --   PLT 272  --   CREATININE  --  1.5*   Estimated Creatinine Clearance: 35.9 mL/min (by C-G formula based on Cr of 1.5). No results for input(s): VANCOTROUGH, VANCOPEAK, VANCORANDOM, GENTTROUGH, GENTPEAK, GENTRANDOM, TOBRATROUGH, TOBRAPEAK, TOBRARND, AMIKACINPEAK, AMIKACINTROU, AMIKACIN in the last 72 hours.   Microbiology: No results found for this or any previous visit (from the past 720 hour(s)).  Medical History: Past Medical History  Diagnosis Date  . Personal history of contact with and (suspected) exposure to asbestos   . Unspecified essential hypertension   . Other and unspecified hyperlipidemia   . Esophageal reflux   . Diverticulosis of colon (without mention of hemorrhage)   . Elevated prostate specific antigen (PSA)   . Osteoarthrosis, unspecified whether generalized or localized, unspecified site   . Anxiety state, unspecified   . Rectal mass   . Shortness of breath     due to fluid  . Pulmonary emboli 03/10/2014  . Cancer     right lung  . Mesothelioma     Medications:  Scheduled:  . sodium chloride   Intravenous STAT  . [START ON 04/17/2014] antiseptic oral rinse  7 mL Mouth Rinse q12n4p  . budesonide (PULMICORT) nebulizer solution  0.25 mg Nebulization BID  . chlorhexidine  15 mL Mouth Rinse BID  . [START ON 04/17/2014] feeding supplement (ENSURE COMPLETE)  237 mL Oral BID BM  .  finasteride  5 mg Oral UD  . folic acid  1 mg Oral Daily  . guaiFENesin  600 mg Oral BID  . pantoprazole  40 mg Oral Daily  . piperacillin-tazobactam  3.375 g Intravenous Once  . rivaroxaban  20 mg Oral Q supper  . terazosin  10 mg Oral QPM  . valACYclovir  1,000 mg Oral TID  . vancomycin  1,000 mg Intravenous Once   Infusions:  . sodium chloride 75 mL/hr at 05/01/2014 1845   PRN: acetaminophen **OR** acetaminophen, HYDROcodone-homatropine, ipratropium-albuterol, ondansetron **OR** ondansetron (ZOFRAN) IV, oxyCODONE  Assessment: 80 yo male with mesothelioma on chemotherapy presents from [facility] with increase SHOB. Has hx PE and is on xarelto. CT angio negative for PE, questionable bronchiolitis. Pharmacy consulted to dose vancomycin and zosyn.  12/16 >> Vancomycin >> 12/16 >> Zosyn >>  Tmax: 98.2 WBC: 8.3k Renal: SCr 1.5 (near baseline 1.3-1.5), CrCl 41 ml/min N, 35 ml/min CG Lactate: 2.55  12/16 blood: 12/16 sputum:  Goal of Therapy:  Vancomycin trough level 15-20 mcg/ml Zosyn dose appropriate for indication, renal function   Plan:   Vancomycin 1g IV q24h Check trough at steady state Zosyn 3.375gm IV q8h (4hr extended infusions) Follow up renal function & cultures, clinical course  Peggyann Juba, PharmD, BCPS Pager: [phone] 04/17/2014,7:01 PM

## 2014-04-16 NOTE — ED Notes (Addendum)
Pt presents from SOB. Hx lung cancer, PE. Finished chemotherapy recently. Son reports this dyspnea is increased from baseline. Patient has nasal cannula on with 2L O2. Increased WOB noted. On Xarelto. Reports dizziness upon standing. MD Pickering at the bedside.

## 2014-04-16 NOTE — Addendum Note (Signed)
Addended by: Simon Rhein L on: 04/15/2014 10:26 AM   Modules accepted: Orders

## 2014-04-16 NOTE — ED Notes (Signed)
Patient transported to CT 

## 2014-04-16 NOTE — ED Provider Notes (Signed)
CSN: [id number]     Arrival date & time 04/22/2014  1247 History   First MD Initiated Contact with Patient 04/08/2014 1254     Chief Complaint  Patient presents with  . Shortness of Breath  . Lung Cancer     (Consider location/radiation/quality/duration/timing/severity/associated sxs/prior Treatment) Patient is a 80 y.o. male presenting with shortness of breath. The history is provided by the patient.  Shortness of Breath Severity:  Moderate Associated symptoms: cough   Associated symptoms: no abdominal pain, no headaches, no rash and no vomiting    patient has a history of mesothelioma. Has been on 5 courses of chemotherapy. Last one was around 5 weeks ago. He has not recovered much since the most recent chemotherapy. He states from 3 weeks ago he began to have more shortness of breath. He got much worse over the last 3 days. He states he's been able to do much less activity. He has had some lightheadedness with sitting and with exercise. No hemoptysis no fevers. He has a previous history of pulmonary embolism and is on anticoagulation. No nausea vomiting although he has had decreased oral intake. No dysuria. No chest pain. He got seen at his oncologist office and sent to the ER today.  Past Medical History  Diagnosis Date  . Personal history of contact with and (suspected) exposure to asbestos   . Unspecified essential hypertension   . Other and unspecified hyperlipidemia   . Esophageal reflux   . Diverticulosis of colon (without mention of hemorrhage)   . Elevated prostate specific antigen (PSA)   . Osteoarthrosis, unspecified whether generalized or localized, unspecified site   . Anxiety state, unspecified   . Rectal mass   . Shortness of breath     due to fluid  . Pulmonary emboli 03/10/2014   Past Surgical History  Procedure Laterality Date  . Inguinal hernia repair  1989    left  . Colonoscopy w/ polypectomy    . Video assisted thoracoscopy (vats)/decortication Right  09/25/2013    Procedure: VIDEO ASSISTED THORACOSCOPY (VATS)/POSSIBLE DECORTICATION;  Surgeon: Melrose Nakayama, MD;  Location: [facility] OR;  Service: Thoracic;  Laterality: Right;  . Pleural effusion drainage Right 09/25/2013    Procedure: DRAINAGE OF PLEURAL EFFUSION;  Surgeon: Melrose Nakayama, MD;  Location: [place];  Service: Thoracic;  Laterality: Right;   Family History  Problem Relation Age of Onset  . Coronary artery disease Father   . Coronary artery disease Mother   . Heart attack Brother   . Colon cancer Father     ? not exactly sure   History  Substance Use Topics  . Smoking status: Never Smoker   . Smokeless tobacco: Not on file  . Alcohol Use: No    Review of Systems  Constitutional: Positive for fatigue. Negative for activity change and appetite change.  Eyes: Negative for pain.  Respiratory: Positive for cough and shortness of breath. Negative for chest tightness.   Cardiovascular: Negative for leg swelling.  Gastrointestinal: Negative for nausea, vomiting, abdominal pain and diarrhea.  Genitourinary: Negative for flank pain.  Musculoskeletal: Negative for back pain and neck stiffness.  Skin: Negative for rash.  Neurological: Positive for light-headedness. Negative for weakness, numbness and headaches.  Psychiatric/Behavioral: Negative for behavioral problems.      Allergies  Other  Home Medications   Prior to Admission medications   Medication Sig Start Date End Date Taking? Authorizing Provider  atenolol (TENORMIN) 25 MG tablet Take 1 tablet (25 mg total) by  mouth daily. 11/14/13  Yes Dorena Cookey, MD  EPINEPHrine (EPIPEN 2-PAK) 0.3 mg/0.3 mL DEVI Inject 0.3 mLs (0.3 mg total) into the muscle once. 10/10/12  Yes Dorena Cookey, MD  esomeprazole (NEXIUM) 20 MG capsule Take 20 mg by mouth every morning.    Yes Historical Provider, MD  finasteride (PROSCAR) 5 MG tablet Take 1 tablet (5 mg total) by mouth as directed. Monday, Wednesday, Friday and Sunday  11/14/13  Yes Dorena Cookey, MD  HYDROcodone-homatropine [facility]) 5-1.5 MG/5ML syrup Take 5 mLs by mouth every 6 (six) hours as needed for cough.   Yes Historical Provider, MD  ondansetron (ZOFRAN) 8 MG tablet Take 1 tablet (8 mg total) by mouth every 8 (eight) hours as needed for nausea or vomiting. 02/04/14  Yes Curt Bears, MD  oxyCODONE (OXY IR/ROXICODONE) 5 MG immediate release tablet 1 - 2 tabs PO Q 4-6 hours PRN pain. Patient taking differently: Take 5-10 mg by mouth every 4 (four) hours as needed for severe pain.  03/10/14  Yes Adrena E Johnson, PA-C  prochlorperazine (COMPAZINE) 10 MG tablet Take 1 tablet (10 mg total) by mouth every 6 (six) hours as needed for nausea or vomiting. 02/04/14  Yes Curt Bears, MD  rivaroxaban (XARELTO) 20 MG TABS tablet Take 1 tablet (20 mg total) by mouth daily with supper. 04/07/14  Yes Curt Bears, MD  terazosin (HYTRIN) 10 MG capsule Take 1 capsule (10 mg total) by mouth every evening. 11/14/13  Yes Dorena Cookey, MD  folic acid (FOLVITE) 1 MG tablet Take 1 tablet (1 mg total) by mouth daily. Patient not taking: Reported on 04/01/2014 12/16/13   Curt Bears, MD  traMADol (ULTRAM) 50 MG tablet Take 1 tablet (50 mg total) by mouth every 6 (six) hours as needed. Patient not taking: Reported on 05/01/2014 12/16/13   Curt Bears, MD  valACYclovir (VALTREX) 1000 MG tablet Take 1 tablet (1,000 mg total) by mouth 3 (three) times daily. Patient not taking: Reported on 04/01/2014 02/26/14   Drue Second, NP   BP 128/72 mmHg  Pulse 77  Temp(Src) 98.2 F (36.8 C) (Oral)  Resp 15  SpO2 100% Physical Exam  Constitutional: He is oriented to person, place, and time. He appears well-developed and well-nourished.  HENT:  Head: Normocephalic and atraumatic.  Eyes: Pupils are equal, round, and reactive to light.  Neck: Normal range of motion. Neck supple. No JVD present.  Cardiovascular: Normal rate, regular rhythm and normal heart sounds.   No  murmur heard. Hypotension  Pulmonary/Chest:  Tachypnea. Decreased breath sounds on right side.  Abdominal: Soft. Bowel sounds are normal. He exhibits no distension and no mass. There is no tenderness. There is no rebound and no guarding.  Musculoskeletal: Normal range of motion. He exhibits no edema.  Neurological: He is alert and oriented to person, place, and time.  Psychiatric: He has a normal mood and affect.  Nursing note and vitals reviewed.   ED Course  Procedures (including critical care time) Labs Review Labs Reviewed  I-STAT CG4 LACTIC ACID, ED - Abnormal; Notable for the following:    Lactic Acid, Venous 2.55 (*)    All other components within normal limits  PRO B NATRIURETIC PEPTIDE  I-STAT TROPOININ, ED    Imaging Review Ct Angio Chest Pe W/cm &/or Wo Cm  04/05/2014   CLINICAL DATA:  Short of breath, history of lung carcinoma and pulmonary embolism, recently completed chemotherapy  EXAM: CT ANGIOGRAPHY CHEST WITH CONTRAST  TECHNIQUE: Multidetector CT  imaging of the chest was performed using the standard protocol during bolus administration of intravenous contrast. Multiplanar CT image reconstructions and MIPs were obtained to evaluate the vascular anatomy.  CONTRAST:  174mL OMNIPAQUE IOHEXOL 350 MG/ML SOLN  COMPARISON:  Chest x-ray of 04/11/2014 and CT chest of 03/03/2014  FINDINGS: The pulmonary arteries are well opacified. There is no evidence of acute pulmonary embolism. The thoracic aorta is not as well opacified but no acute abnormality is seen. The origins of the great vessels are patent. The small mediastinal lymph nodes described previously all appear completely stable. Again the thickened rind throughout the right hemi thorax is noted in this patient with a history of pleural mesothelioma. The pleural rind in several areas may be slightly thicker, particularly along the major fissure, which may indicate some interval progression of disease. Loculated fluid in the  posterior medial right hemi thorax is unchanged.  On lung window images, there is airspace disease in a mosaic distribution primarily throughout much of the left upper lobe and to a lesser degree in the left lower lobe. This is a nonspecific finding, but can be seen with airway disease such as bronchiolitis or vascular occlusion. However with no pulmonary embolism evident, small airway disease would be favored. No bony abnormality is seen.  Review of the MIP images confirms the above findings.  IMPRESSION: 1. No evidence of acute pulmonary embolism. 2. Mosaic profusion abnormality in the left upper lobe and left lower lobe. Question bronchiolitis with no other evidence of vascular etiology. 3. Thickened pleural rind throughout the right hemi thorax in this patient with pleural mesothelioma may be slightly more thickened possibly indicating some progression of the disease in the interval.   Electronically Signed   By: Ivar Drape M.D.   On: 04/11/2014 14:55   Dg Chest Portable 1 View  04/22/2014   CLINICAL DATA:  Shortness of breath, history of lung cancer.  EXAM: PORTABLE CHEST - 1 VIEW  COMPARISON:  CT chest 03/03/2014 and chest radiograph 02/10/2014.  FINDINGS: Trachea is midline. Heart size within normal limits. Mild mixed interstitial and airspace opacification bilaterally with pleural parenchymal scarring at the base of the right hemi thorax. No left pleural fluid.  IMPRESSION: 1. Mild mixed interstitial and airspace disease may be due to a viral or atypical infectious process. 2. Pleural parenchymal scarring at the base of the right hemi thorax.   Electronically Signed   By: Lorin Picket M.D.   On: 04/10/2014 14:12     EKG Interpretation   Date/Time:  Wednesday April 16 2014 13:11:07 EST Ventricular Rate:  77 PR Interval:  115 QRS Duration: 91 QT Interval:  383 QTC Calculation: 433 R Axis:   68 Text Interpretation:  Sinus rhythm Atrial premature complex Borderline  short PR interval  Confirmed by Alvino Chapel  MD, Ovid Curd [id number]) on 04/15/2014  1:38:38 PM      MDM   Final diagnoses:  SOB (shortness of breath)    Patient with shortness of breath and hypotension. CT scan showed possible bronchiolitis. Discussed with hospitalist and will start antibiotics. Will admit to internal medicine. Blood pressure improved with IV fluids.  CRITICAL CARE Performed by: Mackie Pai Total critical care time: 30 Critical care time was exclusive of separately billable procedures and treating other patients. Critical care was necessary to treat or prevent imminent or life-threatening deterioration. Critical care was time spent personally by me on the following activities: development of treatment plan with patient and/or surrogate as well as nursing, discussions  with consultants, evaluation of patient's response to treatment, examination of patient, obtaining history from patient or surrogate, ordering and performing treatments and interventions, ordering and review of laboratory studies, ordering and review of radiographic studies, pulse oximetry and re-evaluation of patient's condition.     Jasper Riling. Alvino Chapel, MD 04/10/2014 [phone]

## 2014-04-16 NOTE — Telephone Encounter (Addendum)
Pt's wife called stating "patient is not doing well and hardly walk and is having trouble breathing".  Advised that pt to come and be evaluated first by Selena Lesser, but he may ultimately end up going to the ED.  She verbalized understanding.  Appts given.

## 2014-04-16 NOTE — ED Notes (Signed)
MD at bedside. 

## 2014-04-16 NOTE — H&P (Signed)
[facility] History and Physical  Sean Mitchell. VVO:[id number] DOB: [birth year] DOA: 04/09/2014  Referring physician: Dr. Alvino Chapel  PCP: [name], MD   Chief Complaint: SOB  HPI: Sean Mitchell. is a 80 y.o. male with PMH of mesothelioma (has just finish chemotherapy appro 4-5 weeks ago), HTN, BPH, GERD, PE; who presented to ED complaining of SOB. Patient was seen at cancer center today and his BP was also very low at time (SBP in the 80's). Patient reprots SOB has been present for the last 2 weeks or so and worsen over the last 10 days and really bad in the last 2 days PTA. Denies fever or chills; but endorses dry cough. Breathing worse with activity. Patient denies CP, HA's, blurred vision, hemoptysis, hematemesis, melena or any other complaints. He is feeling really tire and weak. Also reports poor intake for the last 3 weeks (due to nausea, bad taste and poor appetite). In ED CT angio demonstrated bronchiolitis/PNA and r/o PNA. Work up also demonstrated AKI and dehydration. [facility] called to admit patient for further evaluation and treatment.     Review of Systems:  Negative except as mentioned on HPI.   Past Medical History  Diagnosis Date  . Personal history of contact with and (suspected) exposure to asbestos   . Unspecified essential hypertension   . Other and unspecified hyperlipidemia   . Esophageal reflux   . Diverticulosis of colon (without mention of hemorrhage)   . Elevated prostate specific antigen (PSA)   . Osteoarthrosis, unspecified whether generalized or localized, unspecified site   . Anxiety state, unspecified   . Rectal mass   . Shortness of breath     due to fluid  . Pulmonary emboli 03/10/2014  . Cancer     right lung  . Mesothelioma    Past Surgical History  Procedure Laterality Date  . Inguinal hernia repair  1989    left  . Colonoscopy w/ polypectomy    . Video assisted thoracoscopy (vats)/decortication Right 09/25/2013     Procedure: VIDEO ASSISTED THORACOSCOPY (VATS)/POSSIBLE DECORTICATION;  Surgeon: Melrose Nakayama, MD;  Location: [place];  Service: Thoracic;  Laterality: Right;  . Pleural effusion drainage Right 09/25/2013    Procedure: DRAINAGE OF PLEURAL EFFUSION;  Surgeon: Melrose Nakayama, MD;  Location: [place];  Service: Thoracic;  Laterality: Right;   Social History:  reports that he has never smoked. He has never used smokeless tobacco. He reports that he does not drink alcohol or use illicit drugs.  Allergies  Allergen Reactions  . Other Swelling and Rash    Yellow Jackets    Family History  Problem Relation Age of Onset  . Coronary artery disease Father   . Coronary artery disease Mother   . Heart attack Brother   . Colon cancer Father     ? not exactly sure     Prior to Admission medications   Medication Sig Start Date End Date Taking? Authorizing Provider  atenolol (TENORMIN) 25 MG tablet Take 1 tablet (25 mg total) by mouth daily. 11/14/13  Yes Dorena Cookey, MD  EPINEPHrine (EPIPEN 2-PAK) 0.3 mg/0.3 mL DEVI Inject 0.3 mLs (0.3 mg total) into the muscle once. 10/10/12  Yes Dorena Cookey, MD  esomeprazole (NEXIUM) 20 MG capsule Take 20 mg by mouth every morning.    Yes Historical Provider, MD  finasteride (PROSCAR) 5 MG tablet Take 1 tablet (5 mg total) by mouth as directed. Monday, Wednesday, Friday and Sunday 11/14/13  Yes Dorena Cookey, MD  HYDROcodone-homatropine [facility]) 5-1.5 MG/5ML syrup Take 5 mLs by mouth every 6 (six) hours as needed for cough.   Yes Historical Provider, MD  ondansetron (ZOFRAN) 8 MG tablet Take 1 tablet (8 mg total) by mouth every 8 (eight) hours as needed for nausea or vomiting. 02/04/14  Yes Curt Bears, MD  oxyCODONE (OXY IR/ROXICODONE) 5 MG immediate release tablet 1 - 2 tabs PO Q 4-6 hours PRN pain. Patient taking differently: Take 5-10 mg by mouth every 4 (four) hours as needed for severe pain.  03/10/14  Yes Adrena E Johnson, PA-C  prochlorperazine  (COMPAZINE) 10 MG tablet Take 1 tablet (10 mg total) by mouth every 6 (six) hours as needed for nausea or vomiting. 02/04/14  Yes Curt Bears, MD  rivaroxaban (XARELTO) 20 MG TABS tablet Take 1 tablet (20 mg total) by mouth daily with supper. 04/07/14  Yes Curt Bears, MD  terazosin (HYTRIN) 10 MG capsule Take 1 capsule (10 mg total) by mouth every evening. 11/14/13  Yes Dorena Cookey, MD  folic acid (FOLVITE) 1 MG tablet Take 1 tablet (1 mg total) by mouth daily. Patient not taking: Reported on 04/27/2014 12/16/13   Curt Bears, MD  traMADol (ULTRAM) 50 MG tablet Take 1 tablet (50 mg total) by mouth every 6 (six) hours as needed. Patient not taking: Reported on 04/13/2014 12/16/13   Curt Bears, MD  valACYclovir (VALTREX) 1000 MG tablet Take 1 tablet (1,000 mg total) by mouth 3 (three) times daily. Patient not taking: Reported on 04/15/2014 02/26/14   Drue Second, NP   Physical Exam: Filed Vitals:   04/22/2014 1600 04/03/2014 1630 04/09/2014 1645 04/12/2014 1700  BP: 128/72 122/75  119/75  Pulse:      Temp:      TempSrc:      Resp: 15 19 17 19   SpO2:        Wt Readings from Last 3 Encounters:  03/10/14 65.59 kg (144 lb 9.6 oz)  02/26/14 66.679 kg (147 lb)  02/10/14 67.314 kg (148 lb 6.4 oz)    General:  Appears calm; slightly SOB; no fever. Patient denies CP. Feeling weak and tired  Eyes: PERRL, normal lids, irises & conjunctiva, no icterus or nystagmus ENT: grossly normal hearing,dry MM, no erythema, exudates or thrush; patient w/o drainage out of ears or nostrils  Neck: no LAD, masses or thyromegaly, no JVD Cardiovascular: RRR, no m/r/g. No LE edema. Respiratory: decreased BS at bases bilaterally (right > L); positive rhonchi and scattered exp wheezing Abdomen: soft, nt, nd; positive BS Skin: no rash or induration seen on limited exam; no open wounds Musculoskeletal: grossly normal tone BUE/BLE Psychiatric: grossly normal mood and affect, speech fluent and  appropriate Neurologic: grossly non-focal.          Labs on Admission:  Basic Metabolic Panel:  Recent Labs Lab 04/26/2014 1121  NA 138  K 4.3  CO2 24  GLUCOSE 148*  BUN 30.3*  CREATININE 1.5*  CALCIUM 9.8   Liver Function Tests:  Recent Labs Lab 04/20/2014 1121  AST 23  ALT 9  ALKPHOS 97  BILITOT 0.37  PROT 7.1  ALBUMIN 2.5*   CBC:  Recent Labs Lab 04/18/2014 1121  WBC 8.3  NEUTROABS 6.6*  HGB 10.9*  HCT 34.2*  MCV 93.2  PLT 272   BNP (last 3 results)  Recent Labs  09/19/13 1755 04/27/2014 1312  PROBNP 108.3 326.8   Radiological Exams on Admission: Ct Angio Chest Pe W/cm &/or  Wo Cm  04/20/2014   CLINICAL DATA:  Short of breath, history of lung carcinoma and pulmonary embolism, recently completed chemotherapy  EXAM: CT ANGIOGRAPHY CHEST WITH CONTRAST  TECHNIQUE: Multidetector CT imaging of the chest was performed using the standard protocol during bolus administration of intravenous contrast. Multiplanar CT image reconstructions and MIPs were obtained to evaluate the vascular anatomy.  CONTRAST:  156mL OMNIPAQUE IOHEXOL 350 MG/ML SOLN  COMPARISON:  Chest x-ray of 04/08/2014 and CT chest of 03/03/2014  FINDINGS: The pulmonary arteries are well opacified. There is no evidence of acute pulmonary embolism. The thoracic aorta is not as well opacified but no acute abnormality is seen. The origins of the great vessels are patent. The small mediastinal lymph nodes described previously all appear completely stable. Again the thickened rind throughout the right hemi thorax is noted in this patient with a history of pleural mesothelioma. The pleural rind in several areas may be slightly thicker, particularly along the major fissure, which may indicate some interval progression of disease. Loculated fluid in the posterior medial right hemi thorax is unchanged.  On lung window images, there is airspace disease in a mosaic distribution primarily throughout much of the left upper lobe  and to a lesser degree in the left lower lobe. This is a nonspecific finding, but can be seen with airway disease such as bronchiolitis or vascular occlusion. However with no pulmonary embolism evident, small airway disease would be favored. No bony abnormality is seen.  Review of the MIP images confirms the above findings.  IMPRESSION: 1. No evidence of acute pulmonary embolism. 2. Mosaic profusion abnormality in the left upper lobe and left lower lobe. Question bronchiolitis with no other evidence of vascular etiology. 3. Thickened pleural rind throughout the right hemi thorax in this patient with pleural mesothelioma may be slightly more thickened possibly indicating some progression of the disease in the interval.   Electronically Signed   By: Ivar Drape M.D.   On: 04/04/2014 14:55   Dg Chest Portable 1 View  04/04/2014   CLINICAL DATA:  Shortness of breath, history of lung cancer.  EXAM: PORTABLE CHEST - 1 VIEW  COMPARISON:  CT chest 03/03/2014 and chest radiograph 02/10/2014.  FINDINGS: Trachea is midline. Heart size within normal limits. Mild mixed interstitial and airspace opacification bilaterally with pleural parenchymal scarring at the base of the right hemi thorax. No left pleural fluid.  IMPRESSION: 1. Mild mixed interstitial and airspace disease may be due to a viral or atypical infectious process. 2. Pleural parenchymal scarring at the base of the right hemi thorax.   Electronically Signed   By: Lorin Picket M.D.   On: 04/04/2014 14:12    EKG:  Sinus rhythm; regular rate, no acute ischemic changes.  Assessment/Plan 1-SOB (shortness of breath): appears to be combination of progression of mesothelioma, lung effusion and PNA/bronchiolitis. -will start treatment with antibiotics and nebulizer treatment -Flutter valve and supportive care -will provide PRN oxygen supplementation -will contact PCCM in the morning and discussed with them needs of any specific procedure as part of follow up  on his mesothelioma and further evaluation  2-Hereditary and idiopathic peripheral neuropathy: will monitor. -no taking any medications at home currently -if patient starts complaining will start neurontin or lyrica.  3-GERD: will continue PPI  4-Malignant pleural mesothelioma: per oncology. -Dr. Julien Nordmann informed of admission through [facility]  5-Physical deconditioning: will ask PT to evaluate and provide rec's  6-Pulmonary emboli: actively receiving treatment with xarelto. -CT angio was negative for  acute PE on admission  7-HLD (hyperlipidemia): will check lipid panel -patient was not on statins at home  8-Essential (primary) hypertension: BP very soft on admission.  -will hold atenolol -provide IVF's -continue BPH drugs for now  9-Acute bronchiolitis due to other infectious organisms -treatment with antibiotics as mentioned above -will use broad spectrum regimen initially given immunosuppressant status and recent visit to cancer center for chemotherapy   10-BPH (benign prostatic hyperplasia): continue finasteride and terazosin   11-Protein-calorie malnutrition, severe: started on ensure TID  12-lactic acidosis: due to dehydration: will start IVF's resuscitation -follow lactic acid in am  13-AKI: appears to be pre-renal in nature and due to low BP -will hold atenolol -check UA -provide IVF's -follow renal function in am    Please discussed with PCCM in am regarding findings on CT scan and need for either thoracentesis or any other procedure for closer evaluation.  Code Status: Full DVT Prophylaxis:on xarelto Family Communication: wife at bedside Disposition Plan: inpatient, LOS > 2 midnights; med-surg bed  Time spent: 44 minutes  Barton Dubois [facility] Pager [phone]

## 2014-04-16 NOTE — ED Notes (Signed)
Bed: OT77 Expected date:  Expected time:  Means of arrival:  Comments: Cancer center-low BP/SOB

## 2014-04-17 ENCOUNTER — Encounter: Payer: Self-pay | Admitting: Nurse Practitioner

## 2014-04-17 DIAGNOSIS — F419 Anxiety disorder, unspecified: Secondary | ICD-10-CM | POA: Insufficient documentation

## 2014-04-17 DIAGNOSIS — J218 Acute bronchiolitis due to other specified organisms: Secondary | ICD-10-CM

## 2014-04-17 DIAGNOSIS — Z7901 Long term (current) use of anticoagulants: Secondary | ICD-10-CM | POA: Insufficient documentation

## 2014-04-17 DIAGNOSIS — I2699 Other pulmonary embolism without acute cor pulmonale: Secondary | ICD-10-CM

## 2014-04-17 DIAGNOSIS — R0600 Dyspnea, unspecified: Secondary | ICD-10-CM | POA: Insufficient documentation

## 2014-04-17 DIAGNOSIS — R0602 Shortness of breath: Secondary | ICD-10-CM

## 2014-04-17 DIAGNOSIS — N289 Disorder of kidney and ureter, unspecified: Secondary | ICD-10-CM | POA: Insufficient documentation

## 2014-04-17 DIAGNOSIS — J219 Acute bronchiolitis, unspecified: Secondary | ICD-10-CM

## 2014-04-17 DIAGNOSIS — C384 Malignant neoplasm of pleura: Secondary | ICD-10-CM

## 2014-04-17 DIAGNOSIS — I959 Hypotension, unspecified: Secondary | ICD-10-CM | POA: Insufficient documentation

## 2014-04-17 LAB — CBC
HEMATOCRIT: 28.7 % — AB (ref 39.0–52.0)
Hemoglobin: 9.3 g/dL — ABNORMAL LOW (ref 13.0–17.0)
MCH: 30.8 pg (ref 26.0–34.0)
MCHC: 32.4 g/dL (ref 30.0–36.0)
MCV: 95 fL (ref 78.0–100.0)
Platelets: 265 10*3/uL (ref 150–400)
RBC: 3.02 MIL/uL — ABNORMAL LOW (ref 4.22–5.81)
RDW: 15.7 % — ABNORMAL HIGH (ref 11.5–15.5)
WBC: 8.3 10*3/uL (ref 4.0–10.5)

## 2014-04-17 LAB — STREP PNEUMONIAE URINARY ANTIGEN: STREP PNEUMO URINARY ANTIGEN: NEGATIVE

## 2014-04-17 LAB — SEDIMENTATION RATE: Sed Rate: 132 mm/hr — ABNORMAL HIGH (ref 0–16)

## 2014-04-17 LAB — LIPID PANEL
Cholesterol: 128 mg/dL (ref 0–200)
HDL: 30 mg/dL — AB (ref >39)
LDL CALC: 74 mg/dL (ref 0–99)
TRIGLYCERIDES: 119 mg/dL (ref <150)
Total CHOL/HDL Ratio: 4.3 RATIO
VLDL: 24 mg/dL (ref 0–40)

## 2014-04-17 LAB — BASIC METABOLIC PANEL
Anion gap: 13 (ref 5–15)
BUN: 26 mg/dL — ABNORMAL HIGH (ref 6–23)
CO2: 23 meq/L (ref 19–32)
CREATININE: 1.48 mg/dL — AB (ref 0.50–1.35)
Calcium: 9 mg/dL (ref 8.4–10.5)
Chloride: 104 mEq/L (ref 96–112)
GFR calc Af Amer: 50 mL/min — ABNORMAL LOW (ref >90)
GFR calc non Af Amer: 44 mL/min — ABNORMAL LOW (ref >90)
GLUCOSE: 121 mg/dL — AB (ref 70–99)
Potassium: 4.5 mEq/L (ref 3.7–5.3)
SODIUM: 140 meq/L (ref 137–147)

## 2014-04-17 LAB — LEGIONELLA ANTIGEN, URINE

## 2014-04-17 LAB — HIV ANTIBODY (ROUTINE TESTING W REFLEX): HIV: NONREACTIVE

## 2014-04-17 MED ORDER — METHYLPREDNISOLONE SODIUM SUCC 125 MG IJ SOLR
60.0000 mg | Freq: Four times a day (QID) | INTRAMUSCULAR | Status: DC
  Administered 2014-04-17: 60 mg via INTRAVENOUS
  Filled 2014-04-17 (×3): qty 0.96

## 2014-04-17 MED ORDER — METHYLPREDNISOLONE SODIUM SUCC 125 MG IJ SOLR
60.0000 mg | Freq: Four times a day (QID) | INTRAMUSCULAR | Status: DC
  Administered 2014-04-17 – 2014-04-22 (×19): 60 mg via INTRAVENOUS
  Filled 2014-04-17 (×2): qty 2
  Filled 2014-04-17 (×2): qty 0.96
  Filled 2014-04-17 (×8): qty 2
  Filled 2014-04-17: qty 0.96
  Filled 2014-04-17: qty 2
  Filled 2014-04-17: qty 0.96
  Filled 2014-04-17: qty 2
  Filled 2014-04-17 (×2): qty 0.96
  Filled 2014-04-17 (×4): qty 2

## 2014-04-17 MED ORDER — ATENOLOL 25 MG PO TABS
25.0000 mg | ORAL_TABLET | Freq: Every day | ORAL | Status: DC
  Administered 2014-04-17 – 2014-04-18 (×2): 25 mg via ORAL
  Filled 2014-04-17 (×2): qty 1

## 2014-04-17 NOTE — Assessment & Plan Note (Signed)
Patient has recently been diagnosed with both a DVT and a pulmonary embolism.  Patient is currently taking Xarelto 20 mg on a daily basis.

## 2014-04-17 NOTE — Progress Notes (Signed)
Subjective: The patient is seen and examined today. He is feeling a little bit better but continues to have shortness of breath with minimal exertion. His blood pressure has improved. He denied having any fever or chills. He has no nausea or vomiting.  Objective: Vital signs in last 24 hours: Temp:  [98 F (36.7 C)-98.5 F (36.9 C)] 98.5 F (36.9 C) (12/17 0556) Pulse Rate:  [72-82] 76 (12/17 0556) Resp:  [13-30] 20 (12/17 0556) BP: (78-132)/(46-78) 108/63 mmHg (12/17 0556) SpO2:  [92 %-100 %] 93 % (12/17 0813) Weight:  [138 lb (62.596 kg)] 138 lb (62.596 kg) (12/16 1814)  Intake/Output from previous day: 12/16 0701 - 12/17 0700 In: 360 [P.O.:360] Out: 200 [Urine:200] Intake/Output this shift:    General appearance: alert, cooperative, fatigued and no distress Resp: diminished breath sounds RLL and RML and dullness to percussion RLL and RML Cardio: regular rate and rhythm, S1, S2 normal, no murmur, click, rub or gallop GI: soft, non-tender; bowel sounds normal; no masses,  no organomegaly Extremities: extremities normal, atraumatic, no cyanosis or edema  Lab Results:   Recent Labs  04/01/2014 1121 04/17/14 0442  WBC 8.3 8.3  HGB 10.9* 9.3*  HCT 34.2* 28.7*  PLT 272 265   BMET  Recent Labs  04/23/2014 1121 04/17/14 0442  NA 138 140  K 4.3 4.5  CL  --  104  CO2 24 23  GLUCOSE 148* 121*  BUN 30.3* 26*  CREATININE 1.5* 1.48*  CALCIUM 9.8 9.0    Studies/Results: Ct Angio Chest Pe W/cm &/or Wo Cm  04/26/2014   CLINICAL DATA:  Short of breath, history of lung carcinoma and pulmonary embolism, recently completed chemotherapy  EXAM: CT ANGIOGRAPHY CHEST WITH CONTRAST  TECHNIQUE: Multidetector CT imaging of the chest was performed using the standard protocol during bolus administration of intravenous contrast. Multiplanar CT image reconstructions and MIPs were obtained to evaluate the vascular anatomy.  CONTRAST:  177mL OMNIPAQUE IOHEXOL 350 MG/ML SOLN  COMPARISON:  Chest  x-ray of 04/02/2014 and CT chest of 03/03/2014  FINDINGS: The pulmonary arteries are well opacified. There is no evidence of acute pulmonary embolism. The thoracic aorta is not as well opacified but no acute abnormality is seen. The origins of the great vessels are patent. The small mediastinal lymph nodes described previously all appear completely stable. Again the thickened rind throughout the right hemi thorax is noted in this patient with a history of pleural mesothelioma. The pleural rind in several areas may be slightly thicker, particularly along the major fissure, which may indicate some interval progression of disease. Loculated fluid in the posterior medial right hemi thorax is unchanged.  On lung window images, there is airspace disease in a mosaic distribution primarily throughout much of the left upper lobe and to a lesser degree in the left lower lobe. This is a nonspecific finding, but can be seen with airway disease such as bronchiolitis or vascular occlusion. However with no pulmonary embolism evident, small airway disease would be favored. No bony abnormality is seen.  Review of the MIP images confirms the above findings.  IMPRESSION: 1. No evidence of acute pulmonary embolism. 2. Mosaic profusion abnormality in the left upper lobe and left lower lobe. Question bronchiolitis with no other evidence of vascular etiology. 3. Thickened pleural rind throughout the right hemi thorax in this patient with pleural mesothelioma may be slightly more thickened possibly indicating some progression of the disease in the interval.   Electronically Signed   By: Ivar Drape  M.D.   On: 04/02/2014 14:55   Dg Chest Portable 1 View  04/20/2014   CLINICAL DATA:  Shortness of breath, history of lung cancer.  EXAM: PORTABLE CHEST - 1 VIEW  COMPARISON:  CT chest 03/03/2014 and chest radiograph 02/10/2014.  FINDINGS: Trachea is midline. Heart size within normal limits. Mild mixed interstitial and airspace opacification  bilaterally with pleural parenchymal scarring at the base of the right hemi thorax. No left pleural fluid.  IMPRESSION: 1. Mild mixed interstitial and airspace disease may be due to a viral or atypical infectious process. 2. Pleural parenchymal scarring at the base of the right hemi thorax.   Electronically Signed   By: Lorin Picket M.D.   On: 04/12/2014 14:12    Medications: I have reviewed the patient's current medications.   Assessment/Plan: 1) right malignant pleural mesothelioma: Status post 6 cycles of systemic chemotherapy with cisplatin and Alimta with partial response. The recent CT scan of the chest performed yesterday showed increased thickening of the pleural rind concerning for mild disease progression. I will continue the patient on observation for now and discuss with him other treatment options after discharge. 2) questionable bronchiolitis in the left upper lobe and left lower lobe: Probably explaining his current shortness of breath. I agree with the antibiotic treatment. Consider pulmonary consult for evaluation. 3) history of pulmonary embolism: Currently resolved on the recent CT angiogram of the chest. Continue current treatment with Xarelto. Thank you for taking good care of Mr. Doane, I will continue to follow up the patient with you and assist in his management.   LOS: 1 day    Aalyssa Elderkin K. 04/17/2014

## 2014-04-17 NOTE — Assessment & Plan Note (Signed)
Patient has a history of some chronic, mild anxiety.  May have slightly increased anxiety today related to complain of increased dyspnea.  Will continue to monitor closely.

## 2014-04-17 NOTE — Assessment & Plan Note (Signed)
Patient last received chemotherapy with both cisplatin and Alimta on 02/18/2014.  Since that time patient has been undergoing active observation only.  Patient has plans for labs and a restaging CT on 06/06/2014.  He plans to return to the [facility] for follow-up visit and to review scan results on 06/09/2014.

## 2014-04-17 NOTE — Progress Notes (Addendum)
Patient ID: Sean Mitchell., male   DOB: Jul 25, 1933, 80 y.o.   MRN: [id number] [facility] PROGRESS NOTE  Sean Mitchell. VQQ:[id number] DOB: 1933-08-18 DOA: 04/22/2014 PCP: Sean Man, MD  Brief narrative:    80 y.o. male with past medical history of right malignant pleural mesothelioma, status post 6 cycles of systemic chemotherapy with cisplatin and Alimta with partial response, follows with Dr. Earlie Mitchell of oncology, further history of hypertension, BPH who presented to [facility] ED with worsening shortness of breath, weakness and fatigue progressive over past couple of days prior to this admission. Patient's wife is a Marine scientist and she regularly checks his blood pressure. She reports that patient's blood pressure was in 80's. On admission, patient was hypotensive with blood pressure 78/46. Patient has had oxygen saturation as low as 77% on room air. Chest x-ray on admission revealed mild mixed interstitial and airspace disease which may be due to viral or atypical infectious process. CT angio chest was negative for pulmonary embolism. He did it showed some abnormalities in left upper lung lobe and left lower lobe, questionable bronchiolitis, thickened pleural rind throughout the right hemithorax in patient with pleural mesothelioma which may suggest progression of the disease. Pulmonary has seen the patient in consultation.  Assessment/Plan:    Principal Problem: Acute respiratory failure with hypoxia / malignant pleural mesothelioma / probable pneumonia  - Hypoxia likely secondary to combination of pneumonia as well as possible progression of current pleural mesothelioma. - Patient was started on broad-spectrum antibiotics on the admission, vancomycin and Zosyn. - Legionella is negative, HIV antibody is non reactive, strep pneumonia is negative and blood cultures so far are pending. - Respiratory status is stable this morning. Continue Pulmicort nebulizer twice daily. Continue duoneb  every 6 hours PRN shortness of breath or wheezing. - Appreciate pulmonary consult and recommendations.  Active Problems: Malignant pleural mesothelioma - Dr. Julien Mitchell has seen patient in consultation.   History of pulmonary embolism - Continue anticoagulation with xarelto.  BPH (benign prostatic hyperplasia) - Continue finasteride  Essential hypertension - Continue atenolol 25 mg daily  Chronic kidney disease, stage III - Creatinine on this admission is 1.5 which is around patient's baseline  Anemia of chronic disease - Secondary to history of malignant mesothelioma - Hemoglobin is 9.3 this morning. No current indications for transfusion.  Protein-calorie malnutrition, severe - Nutrition consulted.  DVT Prophylaxis  - on anticoagulation with xarelto   Code Status: Full.  Family Communication:  plan of care discussed with the patient and his family at the bedside Disposition Plan: Home when stable.   IV access:   Peripheral IV  Procedures and diagnostic studies:    Ct Angio Chest Pe W/cm &/or Wo Cm 04/22/2014   1. No evidence of acute pulmonary embolism. 2. Mosaic profusion abnormality in the left upper lobe and left lower lobe. Question bronchiolitis with no other evidence of vascular etiology. 3. Thickened pleural rind throughout the right hemi thorax in this patient with pleural mesothelioma may be slightly more thickened possibly indicating some progression of the disease in the interval.    Dg Chest Portable 1 View 04/09/2014   1. Mild mixed interstitial and airspace disease may be due to a viral or atypical infectious process. 2. Pleural parenchymal scarring at the base of the right hemi thorax.     Medical Consultants:   [place], Dr. Curt Mitchell   Other Consultants:   None   IAnti-Infectives:    Vancomycin 04/01/2014 -->  Zosyn 04/04/2014 --.  Leisa Lenz, MD  [facility] Pager [phone]  If 7PM-7AM, please contact  night-coverage [email or web address] Password [email or web address] 04/17/2014, 2:40 PM   LOS: 1 day    HPI/Subjective: No acute overnight events.  Objective: Filed Vitals:   04/17/14 0556 04/17/14 0813 04/17/14 1135 04/17/14 1427  BP: 108/63   132/69  Pulse: 76   85  Temp: 98.5 F (36.9 C)   99.3 F (37.4 C)  TempSrc: Oral   Oral  Resp: 20   18  Height:      Weight:      SpO2: 92% 93% 77% 93%    Intake/Output Summary (Last 24 hours) at 04/17/14 1440 Last data filed at 04/17/14 0020  Gross per 24 hour  Intake    360 ml  Output    200 ml  Net    160 ml    Exam:   General:  Pt is alert, follows commands appropriately, not in acute distress  Cardiovascular: Regular rate and rhythm, S1/S2, no appreciated  Respiratory: Rhonchi throughout, mild wheezing in mid lung lobes  Abdomen: Soft, non tender, non distended, bowel sounds present  Extremities: No edema, pulses DP and PT palpable bilaterally  Neuro: Grossly nonfocal  Data Reviewed: Basic Metabolic Panel:  Recent Labs Lab 04/25/2014 1121 04/04/2014 1852 04/17/14 0442  NA 138  --  140  K 4.3  --  4.5  CL  --   --  104  CO2 24  --  23  GLUCOSE 148*  --  121*  BUN 30.3*  --  26*  CREATININE 1.5*  --  1.48*  CALCIUM 9.8  --  9.0  MG  --  2.2  --   PHOS  --  3.9  --    Liver Function Tests:  Recent Labs Lab 04/14/2014 1121 04/06/2014 1852  AST 23 25  ALT 9 9  ALKPHOS 97 110  BILITOT 0.37 0.2*  PROT 7.1 7.6  ALBUMIN 2.5* 2.8*   No results for input(s): LIPASE, AMYLASE in the last 168 hours. No results for input(s): AMMONIA in the last 168 hours. CBC:  Recent Labs Lab 04/07/2014 1121 04/17/14 0442  WBC 8.3 8.3  NEUTROABS 6.6*  --   HGB 10.9* 9.3*  HCT 34.2* 28.7*  MCV 93.2 95.0  PLT 272 265   Cardiac Enzymes: No results for input(s): CKTOTAL, CKMB, CKMBINDEX, TROPONINI in the last 168 hours. BNP: Invalid input(s): POCBNP CBG: No results for input(s): GLUCAP in the last 168 hours.  Recent Results (from the past  240 hour(s))  MRSA PCR Screening     Status: None   Collection Time: 04/21/2014  7:47 PM  Result Value Ref Range Status   MRSA by PCR NEGATIVE NEGATIVE Final     Scheduled Meds: . budesonide (PULMICORT) nebulizer solution  0.25 mg Nebulization BID  . chlorhexidine  15 mL Mouth Rinse BID  . feeding supplement (ENSURE COMPLETE)  237 mL Oral BID BM  . finasteride  5 mg Oral Sun Mon Wed Fri  . folic acid  1 mg Oral Daily  . guaiFENesin  600 mg Oral BID  . pantoprazole  40 mg Oral Daily  . piperacillin-tazobactam (ZOSYN)  IV  3.375 g Intravenous Q8H  . rivaroxaban  20 mg Oral Q supper  . terazosin  10 mg Oral QPM  . vancomycin  1,000 mg Intravenous Q24H   Continuous Infusions: . sodium chloride 75 mL/hr at 04/17/14 0215

## 2014-04-17 NOTE — Evaluation (Signed)
Physical Therapy Evaluation Patient Details Name: Sean Mitchell. MRN: [id number] DOB: 06/26/1933 Today's Date: 04/17/2014   History of Present Illness  Sean Mitchell. is a 80 y.o. male with PMH of mesothelioma (has just finish chemotherapy appro 4-5 weeks ago), HTN, BPH, GERD, PE; who presented to ED complaining of SOB.   Clinical Impression  Pt admitted with above diagnosis. Pt currently with functional limitations due to the deficits listed below (see PT Problem List).  Pt will benefit from skilled PT to increase their independence and safety with mobility to allow discharge to the venue listed below.  Pt limited at eval by de-sat on 4L/min via nasal canula.     Follow Up Recommendations Home health PT (depending of level of function at d/c)    Equipment Recommendations  None recommended by PT    Recommendations for Other Services       Precautions / Restrictions Precautions Precautions: Fall Restrictions Weight Bearing Restrictions: No      Mobility  Bed Mobility Overal bed mobility: Needs Assistance Bed Mobility: Rolling;Sidelying to Sit Rolling: Modified independent (Device/Increase time) Sidelying to sit: Min guard          Transfers Overall transfer level: Needs assistance   Transfers: Sit to/from Stand Sit to Stand: Min guard            Ambulation/Gait Ambulation/Gait assistance: Min guard Ambulation Distance (Feet): 2 Feet         General Gait Details: SPT from bed > chair only due to de-sat  Stairs            Wheelchair Mobility    Modified Rankin (Stroke Patients Only)       Balance Overall balance assessment: No apparent balance deficits (not formally assessed)                                           Pertinent Vitals/Pain Pain Assessment: No/denies pain  Orthostatics: supine 121/61                       Sitting 124/63                       Standing 103/67  o2 sat in bed on 4 l/min 93-100%.  With  mobility decreased to 77% and took ~ 5 mins to get back to 90% with education on pursed lip breathing.    Home Living Family/patient expects to be discharged to:: Private residence Living Arrangements: Spouse/significant other Available Help at Discharge: Available 24 hours/day Type of Home: House Home Access: Level entry     Home Layout: One [place]: [place] - 2 wheels;Bedside commode;Shower seat - built in;Crutches      Prior Function Level of Independence: Independent               Hand Dominance        Extremity/Trunk Assessment   Upper Extremity Assessment: Overall WFL for tasks assessed           Lower Extremity Assessment: Overall WFL for tasks assessed         Communication      Cognition Arousal/Alertness: Awake/alert Behavior During Therapy: WFL for tasks assessed/performed Overall Cognitive Status: Within Functional Limits for tasks assessed  General Comments General comments (skin integrity, edema, etc.): Pt limited by decreased o2 sats down to 77% with  supine to sit  and SPT bed > recliner on 4 L/min.  Education at [facility] and in recliner on pursed lip breathing.  o2 sats were 90% at end of session, but took sats 5 mins to get back to 90s    Exercises        Assessment/Plan    PT Assessment Patient needs continued PT services  PT Diagnosis Difficulty walking   PT Problem List Cardiopulmonary status limiting activity;Decreased mobility;Decreased activity tolerance  PT Treatment Interventions Gait training;Functional mobility training;Therapeutic activities;Therapeutic exercise;Balance training   PT Goals (Current goals can be found in the Care Plan section) Acute Rehab PT Goals Patient Stated Goal: To catch his breath PT Goal Formulation: With patient/family Time For Goal Achievement: 05/01/14 Potential to Achieve Goals: Good    Frequency Min 3X/week   Barriers to discharge         Co-evaluation               End of Session Equipment Utilized During Treatment: Oxygen Activity Tolerance: Treatment limited secondary to medical complications (Comment);Other (comment) (decreased o2 sats) Patient left: in chair;with family/visitor present;with call bell/phone within reach Nurse Communication: Mobility status;Other (comment) (o2 and orthostatics)         Time: 0093-8182 PT Time Calculation (min) (ACUTE ONLY): 31 min   Charges:   PT Evaluation $Initial PT Evaluation Tier I: 1 Procedure PT Treatments $Therapeutic Activity: 8-22 mins   PT G Codes:          Sean Mitchell 04/17/2014, 11:43 AM

## 2014-04-17 NOTE — Assessment & Plan Note (Signed)
Patient is complaining of progressive dyspnea within the past several days.  He denies any productive cough, chest pain, chest pressure, or pain with inspiration.  Also denies any fevers or chills.  On initial check-O2 sat on room air was 87%.  O2 sat on room air after minimal exertion was 85%.  Patient was placed on 2 L nasal cannula O2; and O2 sat improved to 93%.  Due to both dyspnea and noted hypertension-patient will be transported to the emergency department for further evaluation.  Both patient and family in agreement with this plan.

## 2014-04-17 NOTE — Progress Notes (Signed)
will   SYMPTOM MANAGEMENT CLINIC   HPI: Sean Mitchell. 80 y.o. male diagnosed with malignant pleural mesothelioma.  Status post cisplatin/Alimta chemotherapy regimen last received on 02/18/2014.  Currently undergoing observation only.  Patient called the cancer Center today requesting urgent care visit.  He is complaining of increased fatigue/weakness, hypotension, and increased dyspnea.  He denies any worsening productive cough; and denies any chest pain, chest pressure, or pain with inspiration.  He does have a history of both DVT and pulmonary embolism in the past; and continues to take Xarelto as previously directed.  Patient also has history of mild, chronic anxiety as well.  He denies any recent fevers or chills.  He denies any UTI symptoms whatsoever.  Of note-patient does continue to take both his Hytrin in [place] as previously directed.  He last took these 2 medications last night prior to bed.   HPI  No upcoming days in selected categories.    ROS  Past Medical History  Diagnosis Date  . Personal history of contact with and (suspected) exposure to asbestos   . Unspecified essential hypertension   . Other and unspecified hyperlipidemia   . Esophageal reflux   . Diverticulosis of colon (without mention of hemorrhage)   . Elevated prostate specific antigen (PSA)   . Osteoarthrosis, unspecified whether generalized or localized, unspecified site   . Anxiety state, unspecified   . Rectal mass   . Shortness of breath     due to fluid  . Pulmonary emboli 03/10/2014  . Cancer     right lung  . Mesothelioma     Past Surgical History  Procedure Laterality Date  . Inguinal hernia repair  1989    left  . Colonoscopy w/ polypectomy    . Video assisted thoracoscopy (vats)/decortication Right 09/25/2013    Procedure: VIDEO ASSISTED THORACOSCOPY (VATS)/POSSIBLE DECORTICATION;  Surgeon: Melrose Nakayama, MD;  Location: [facility];  Service: Thoracic;  Laterality: Right;  .  Pleural effusion drainage Right 09/25/2013    Procedure: DRAINAGE OF PLEURAL EFFUSION;  Surgeon: Melrose Nakayama, MD;  Location: [facility];  Service: Thoracic;  Laterality: Right;    has HYPERLIPIDEMIA; Hereditary and idiopathic peripheral neuropathy; HYPERTENSION; GERD; DEGENERATIVE JOINT DISEASE; ELEVATED PROSTATE SPECIFIC ANTIGEN; HISTORY OF ASBESTOS EXPOSURE; Hearing loss, conductive, bilateral; Benign paroxysmal positional vertigo; Pneumonia, viral; Pleural effusion, right; CAP (community acquired pneumonia); Pleural effusion; Pneumonia; Malignant pleural mesothelioma; Superficial phlebitis; Superficial venous thrombosis of right upper extremity; Antineoplastic chemotherapy induced anemia(285.3); Hypoalbuminemia; Fatigue; Physical deconditioning; Shingles; Constipation; Left pulmonary embolus; Pulmonary emboli; SOB (shortness of breath); HLD (hyperlipidemia); Essential (primary) hypertension; Acute bronchiolitis due to other infectious organisms; BPH (benign prostatic hyperplasia); HCAP (healthcare-associated pneumonia); Protein-calorie malnutrition, severe; Long term current use of anticoagulant therapy; Hypotension; Anxiety; Renal insufficiency; and Dyspnea on his problem list.     is allergic to other.    Medication List       This list is accurate as of: 04/26/2014 12:51 PM.  Always use your most recent med list.               atenolol 25 MG tablet  Commonly known as:  TENORMIN  Take 1 tablet (25 mg total) by mouth daily.     dexamethasone 4 MG tablet  Commonly known as:  DECADRON  Take 4 mg by mouth 2 (two) times daily with a meal. 1 tablet twice daily the day before, the day of, and the day after chemo     EPINEPHrine 0.3 mg/0.3 mL  Soaj injection  Commonly known as:  EPIPEN 2-PAK  Inject 0.3 mLs (0.3 mg total) into the muscle once.     esomeprazole 20 MG capsule  Commonly known as:  NEXIUM  Take 20 mg by mouth every morning.     finasteride 5 MG tablet  Commonly known as:   PROSCAR  Take 1 tablet (5 mg total) by mouth as directed. Monday, Wednesday, Friday and Sunday     folic acid 1 MG tablet  Commonly known as:  FOLVITE  Take 1 tablet (1 mg total) by mouth daily.     HYDROcodone-homatropine 5-1.5 MG/5ML syrup  Commonly known as:  HYCODAN  Take 5 mLs by mouth every 6 (six) hours as needed for cough.     ondansetron 8 MG tablet  Commonly known as:  ZOFRAN  Take 1 tablet (8 mg total) by mouth every 8 (eight) hours as needed for nausea or vomiting.     oxyCODONE 5 MG immediate release tablet  Commonly known as:  Oxy IR/ROXICODONE  1 - 2 tabs PO Q 4-6 hours PRN pain.     prochlorperazine 10 MG tablet  Commonly known as:  COMPAZINE  Take 1 tablet (10 mg total) by mouth every 6 (six) hours as needed for nausea or vomiting.     rivaroxaban 20 MG Tabs tablet  Commonly known as:  XARELTO  Take 1 tablet (20 mg total) by mouth daily with supper.     terazosin 10 MG capsule  Commonly known as:  HYTRIN  Take 1 capsule (10 mg total) by mouth every evening.     traMADol 50 MG tablet  Commonly known as:  ULTRAM  Take 1 tablet (50 mg total) by mouth every 6 (six) hours as needed.     valACYclovir 1000 MG tablet  Commonly known as:  VALTREX  Take 1 tablet (1,000 mg total) by mouth 3 (three) times daily.         PHYSICAL EXAMINATION  Blood pressure 78/46, pulse 82, temperature 98 F (36.7 C), resp. rate 20, height 5' 8"  (1.727 m), weight 0 lb (0 kg), SpO2 95 %.  Physical Exam  Constitutional: He is oriented to person, place, and time. He appears malnourished and dehydrated. He appears unhealthy. He appears cachectic.  HENT:  Head: Normocephalic and atraumatic.  Mouth/Throat: Oropharynx is clear and moist.  Eyes: Conjunctivae and EOM are normal. Pupils are equal, round, and reactive to light. Right eye exhibits no discharge. Left eye exhibits no discharge. No scleral icterus.  Neck: Normal range of motion. Neck supple. No JVD present. No tracheal  deviation present. No thyromegaly present.  Cardiovascular: Normal rate, regular rhythm, normal heart sounds and intact distal pulses.   Pulmonary/Chest: Breath sounds normal. No stridor. No respiratory distress. He has no wheezes. He has no rales. He exhibits no tenderness.  Patient does appear short of breath on exam.  Patient is noted to have a regular respiratory rate; with occasional episodes of panting.  O2 sat was  after ambulating; initially 87% on room air; and was 85% after any exertion.  Patient was placed on O2 at 2 L via nasal cannula; and O2 sat 93%.   Abdominal: Soft. Bowel sounds are normal. He exhibits no distension and no mass. There is no tenderness. There is no rebound and no guarding.  Musculoskeletal: Normal range of motion. He exhibits no edema or tenderness.  Lymphadenopathy:    He has no cervical adenopathy.  Neurological: He is alert and oriented to  person, place, and time.  Skin: Skin is warm and dry. No rash noted. No erythema. There is pallor.  Psychiatric: Affect normal.  Nursing note and vitals reviewed.   LABORATORY DATA:. Admission on 04/27/2014  Component Date Value Ref Range Status  . Lactic Acid, Venous 04/08/2014 2.55* 0.5 - 2.2 mmol/L Final  . Troponin i, poc 04/13/2014 0.00  0.00 - 0.08 ng/mL Final  . Comment 3 04/18/2014          Final   Comment: Due to the release kinetics of cTnI, a negative result within the first hours of the onset of symptoms does not rule out myocardial infarction with certainty. If myocardial infarction is still suspected, repeat the test at appropriate intervals.   . Pro B Natriuretic peptide (BNP) 04/08/2014 326.8  0 - 450 pg/mL Final  . HIV 1&2 Ab, 4th Generation 04/15/2014 NONREACTIVE  NONREACTIVE Final   Comment: (NOTE) A NONREACTIVE HIV Ag/Ab result does not exclude HIV infection since the time frame for seroconversion is variable. If acute HIV infection is suspected, a HIV-1 RNA Qualitative TMA test is  recommended. HIV-1/2 Antibody Diff         Not indicated. HIV-1 RNA, Qual TMA           Not indicated. PLEASE NOTE: This information has been disclosed to you from records whose confidentiality may be protected by state law. If your state requires such protection, then the state law prohibits you from making any further disclosure of the information without the specific written consent of the person to whom it pertains, or as otherwise permitted by law. A general authorization for the release of medical or other information is NOT sufficient for this purpose. The performance of this assay has not been clinically validated in patients less than 7 years old. Performed at [facility]   . Strep Pneumo Urinary Antigen 04/17/2014 NEGATIVE  NEGATIVE Final   Comment:        Infection due to S. pneumoniae cannot be absolutely ruled out since the antigen present may be below the detection limit of the test. PERFORMED AT [facility] Performed at [facility]   . Phosphorus 04/03/2014 3.9  2.3 - 4.6 mg/dL Final  . Magnesium 04/22/2014 2.2  1.5 - 2.5 mg/dL Final  . Total Protein 04/25/2014 7.6  6.0 - 8.3 g/dL Final  . Albumin 04/30/2014 2.8* 3.5 - 5.2 g/dL Final  . AST 04/18/2014 25  0 - 37 U/L Final  . ALT 04/14/2014 9  0 - 53 U/L Final  . Alkaline Phosphatase 04/24/2014 110  39 - 117 U/L Final  . Total Bilirubin 04/11/2014 0.2* 0.3 - 1.2 mg/dL Final  . Bilirubin, Direct 04/04/2014 <0.2  0.0 - 0.3 mg/dL Final  . Indirect Bilirubin 04/02/2014 NOT CALCULATED  0.3 - 0.9 mg/dL Final  . TSH 04/14/2014 3.010  0.350 - 4.500 uIU/mL Final   Performed at [facility]  . Sodium 04/17/2014 140  137 - 147 mEq/L Final  . Potassium 04/17/2014 4.5  3.7 - 5.3 mEq/L Final  . Chloride 04/17/2014 104  96 - 112 mEq/L Final  . CO2 04/17/2014 23  19 - 32 mEq/L Final  . Glucose, Bld 04/17/2014 121* 70 - 99 mg/dL Final  . BUN 04/17/2014 26* 6 - 23 mg/dL Final  . Creatinine, Ser  04/17/2014 1.48* 0.50 - 1.35 mg/dL Final  . Calcium 04/17/2014 9.0  8.4 - 10.5 mg/dL Final  . GFR calc non Af Amer 04/17/2014 44* >  90 mL/min Final  . GFR calc Af Amer 04/17/2014 50* >90 mL/min Final   Comment: (NOTE) The eGFR has been calculated using the CKD EPI equation. This calculation has not been validated in all clinical situations. eGFR's persistently <90 mL/min signify possible Chronic Kidney Disease.   . Anion gap 04/17/2014 13  5 - 15 Final  . WBC 04/17/2014 8.3  4.0 - 10.5 K/uL Final  . RBC 04/17/2014 3.02* 4.22 - 5.81 MIL/uL Final  . Hemoglobin 04/17/2014 9.3* 13.0 - 17.0 g/dL Final  . HCT 04/17/2014 28.7* 39.0 - 52.0 % Final  . MCV 04/17/2014 95.0  78.0 - 100.0 fL Final  . MCH 04/17/2014 30.8  26.0 - 34.0 pg Final  . MCHC 04/17/2014 32.4  30.0 - 36.0 g/dL Final  . RDW 04/17/2014 15.7* 11.5 - 15.5 % Final  . Platelets 04/17/2014 265  150 - 400 K/uL Final  . Cholesterol 04/17/2014 128  0 - 200 mg/dL Final  . Triglycerides 04/17/2014 119  <150 mg/dL Final  . HDL 04/17/2014 30* >39 mg/dL Final  . Total CHOL/HDL Ratio 04/17/2014 4.3   Final  . VLDL 04/17/2014 24  0 - 40 mg/dL Final  . LDL Cholesterol 04/17/2014 74  0 - 99 mg/dL Final   Comment:        Total Cholesterol/HDL:CHD Risk Coronary Heart Disease Risk Table                     Men   Women  1/2 Average Risk   3.4   3.3  Average Risk       5.0   4.4  2 X Average Risk   9.6   7.1  3 X Average Risk  23.4   11.0        Use the calculated Patient Ratio above and the CHD Risk Table to determine the patient's CHD Risk.        ATP III CLASSIFICATION (LDL):  <100     mg/dL   Optimal  100-129  mg/dL   Near or Above                    Optimal  130-159  mg/dL   Borderline  160-189  mg/dL   High  >190     mg/dL   Very High Performed at [facility]   . MRSA by PCR 04/18/2014 NEGATIVE  NEGATIVE Final   Comment:        The GeneXpert MRSA Assay (FDA approved for NASAL specimens only), is one component of  a comprehensive MRSA colonization surveillance program. It is not intended to diagnose MRSA infection nor to guide or monitor treatment for MRSA infections.   Appointment on 04/04/2014  Component Date Value Ref Range Status  . WBC 04/14/2014 8.3  4.0 - 10.3 10e3/uL Final  . NEUT# 04/07/2014 6.6* 1.5 - 6.5 10e3/uL Final  . HGB 04/08/2014 10.9* 13.0 - 17.1 g/dL Final  . HCT 04/26/2014 34.2* 38.4 - 49.9 % Final  . Platelets 04/13/2014 272  140 - 400 10e3/uL Final  . MCV 04/11/2014 93.2  79.3 - 98.0 fL Final  . MCH 05/01/2014 29.8  27.2 - 33.4 pg Final  . MCHC 04/15/2014 32.0  32.0 - 36.0 g/dL Final  . RBC 04/25/2014 3.66* 4.20 - 5.82 10e6/uL Final  . RDW 04/12/2014 15.9* 11.0 - 14.6 % Final  . lymph# 04/05/2014 0.9  0.9 - 3.3 10e3/uL Final  . MONO# 04/03/2014 0.6  0.1 -  0.9 10e3/uL Final  . Eosinophils Absolute 04/17/2014 0.1  0.0 - 0.5 10e3/uL Final  . Basophils Absolute 05/01/2014 0.0  0.0 - 0.1 10e3/uL Final  . NEUT% 04/08/2014 79.2* 39.0 - 75.0 % Final  . LYMPH% 04/12/2014 11.3* 14.0 - 49.0 % Final  . MONO% 04/27/2014 7.7  0.0 - 14.0 % Final  . EOS% 04/01/2014 1.4  0.0 - 7.0 % Final  . BASO% 04/13/2014 0.4  0.0 - 2.0 % Final  . Sodium 04/18/2014 138  136 - 145 mEq/L Final  . Potassium 04/10/2014 4.3  3.5 - 5.1 mEq/L Final  . Chloride 04/01/2014 102  98 - 109 mEq/L Final  . CO2 04/04/2014 24  22 - 29 mEq/L Final  . Glucose 04/02/2014 148* 70 - 140 mg/dl Final  . BUN 04/06/2014 30.3* 7.0 - 26.0 mg/dL Final  . Creatinine 04/14/2014 1.5* 0.7 - 1.3 mg/dL Final  . Total Bilirubin 04/04/2014 0.37  0.20 - 1.20 mg/dL Final  . Alkaline Phosphatase 04/17/2014 97  40 - 150 U/L Final  . AST 04/29/2014 23  5 - 34 U/L Final  . ALT 04/17/2014 9  0 - 55 U/L Final  . Total Protein 04/17/2014 7.1  6.4 - 8.3 g/dL Final  . Albumin 04/10/2014 2.5* 3.5 - 5.0 g/dL Final  . Calcium 04/14/2014 9.8  8.4 - 10.4 mg/dL Final  . Anion Gap 04/04/2014 12* 3 - 11 mEq/L Final  . EGFR 04/18/2014 43* >90  ml/min/1.73 m2 Final   eGFR is calculated using the CKD-EPI Creatinine Equation (2009)     RADIOGRAPHIC STUDIES: Ct Angio Chest Pe W/cm &/or Wo Cm  04/01/2014   CLINICAL DATA:  Short of breath, history of lung carcinoma and pulmonary embolism, recently completed chemotherapy  EXAM: CT ANGIOGRAPHY CHEST WITH CONTRAST  TECHNIQUE: Multidetector CT imaging of the chest was performed using the standard protocol during bolus administration of intravenous contrast. Multiplanar CT image reconstructions and MIPs were obtained to evaluate the vascular anatomy.  CONTRAST:  133m OMNIPAQUE IOHEXOL 350 MG/ML SOLN  COMPARISON:  Chest x-ray of 04/11/2014 and CT chest of 03/03/2014  FINDINGS: The pulmonary arteries are well opacified. There is no evidence of acute pulmonary embolism. The thoracic aorta is not as well opacified but no acute abnormality is seen. The origins of the great vessels are patent. The small mediastinal lymph nodes described previously all appear completely stable. Again the thickened rind throughout the right hemi thorax is noted in this patient with a history of pleural mesothelioma. The pleural rind in several areas may be slightly thicker, particularly along the major fissure, which may indicate some interval progression of disease. Loculated fluid in the posterior medial right hemi thorax is unchanged.  On lung window images, there is airspace disease in a mosaic distribution primarily throughout much of the left upper lobe and to a lesser degree in the left lower lobe. This is a nonspecific finding, but can be seen with airway disease such as bronchiolitis or vascular occlusion. However with no pulmonary embolism evident, small airway disease would be favored. No bony abnormality is seen.  Review of the MIP images confirms the above findings.  IMPRESSION: 1. No evidence of acute pulmonary embolism. 2. Mosaic profusion abnormality in the left upper lobe and left lower lobe. Question bronchiolitis  with no other evidence of vascular etiology. 3. Thickened pleural rind throughout the right hemi thorax in this patient with pleural mesothelioma may be slightly more thickened possibly indicating some progression of the disease in the  interval.   Electronically Signed   By: Ivar Drape M.D.   On: 04/24/2014 14:55   Dg Chest Portable 1 View  04/07/2014   CLINICAL DATA:  Shortness of breath, history of lung cancer.  EXAM: PORTABLE CHEST - 1 VIEW  COMPARISON:  CT chest 03/03/2014 and chest radiograph 02/10/2014.  FINDINGS: Trachea is midline. Heart size within normal limits. Mild mixed interstitial and airspace opacification bilaterally with pleural parenchymal scarring at the base of the right hemi thorax. No left pleural fluid.  IMPRESSION: 1. Mild mixed interstitial and airspace disease may be due to a viral or atypical infectious process. 2. Pleural parenchymal scarring at the base of the right hemi thorax.   Electronically Signed   By: Lorin Picket M.D.   On: 04/21/2014 14:12    ASSESSMENT/PLAN:    Anxiety Patient has a history of some chronic, mild anxiety.  May have slightly increased anxiety today related to complain of increased dyspnea.  Will continue to monitor closely.  Hypoalbuminemia Albumin continues decreased.  Patient was encouraged to push protein in her diet is much as possible.  Hypotension Blood pressure decreased to 78/46 on initial check when presenting to the [facility] today.  Patient confirms that he did take both of his blood pressure medications last night prior to bed.  Hypertension is most likely secondary to both blood pressure medications and dehydration.  IV fluid rehydration was started here in the [place]; and patient was transferred to the emergency department for further evaluation.  Long term current use of anticoagulant therapy Patient has recently been diagnosed with both a DVT and a pulmonary embolism.  Patient is currently taking Xarelto 20 mg  on a daily basis.  Malignant pleural mesothelioma Patient last received chemotherapy with both cisplatin and Alimta on 02/18/2014.  Since that time patient has been undergoing active observation only.  Patient has plans for labs and a restaging CT on 06/06/2014.  He plans to return to the [facility] for follow-up visit and to review scan results on 06/09/2014.  Renal insufficiency Patient has a history of chronic renal insufficiency.  Creatinine remains slightly elevated but stable at 1.5 today.  Dyspnea Patient is complaining of progressive dyspnea within the past several days.  He denies any productive cough, chest pain, chest pressure, or pain with inspiration.  Also denies any fevers or chills.  On initial check-O2 sat on room air was 87%.  O2 sat on room air after minimal exertion was 85%.  Patient was placed on 2 L nasal cannula O2; and O2 sat improved to 93%.  Due to both dyspnea and noted hypertension-patient will be transported to the emergency department for further evaluation.  Both patient and family in agreement with this plan.   Patient stated understanding of all instructions; and was in agreement with this plan of care. The patient knows to call the clinic with any problems, questions or concerns.   This was a shared visit with Dr. Julien Nordmann today.  Total time spent with patient was 40 minutes;  with greater than 75 percent of that time spent in face to face counseling regarding his symptoms, and coordination of care and follow up.  Disclaimer: This note was dictated with voice recognition software. Similar sounding words can inadvertently be transcribed and may not be corrected upon review.   Drue Second, NP 04/17/2014   ADDENDUM: Hematology/Oncology Attending: I had a face to face encounter with the patient. I recommended his care plan. This is a very  pleasant 80 years old white male with history of right malignant pleural mesothelioma status post 6 cycles of  systemic chemotherapy with cisplatin and Alimta and has been observation for the last 2 months. His wife called earlier today indicating that the patient has worsening dyspnea and significant weakness. On evaluation today his oxygen saturation was low around 85% with room air and his systemic blood pressure was in the 70s. He denied having any fever or chills. He is currently on Xarelto for the venous thrombosis and pulmonary embolism. After discussion with the patient and his family, I recommended for him to go to the emergency department at [facility] for further evaluation and consideration of CT angiogram of the chest to rule out pneumonia, pulmonary embolism or worsening of his disease. I will continue to follow up the patient during his hospitalization.  Disclaimer: This note was dictated with voice recognition software. Similar sounding words can inadvertently be transcribed and may be missed upon review. Eilleen Kempf., MD 04/19/2014

## 2014-04-17 NOTE — Consult Note (Addendum)
Name: Sean Mitchell. MRN: [id number] DOB: May 28, 1933    ADMISSION DATE:  04/29/2014 CONSULTATION DATE:  12/17  REFERRING MD :  Charlies Silvers   CHIEF COMPLAINT/reason for consult:  Dyspnea and pulmonary infiltrates   BRIEF PATIENT DESCRIPTION:  This is a 80 year old male s/p 6 cycles of systemic chemo w/ Cisplatin and Alimta for right malignant pleural Mesothelioma. His last round of Chemo was 10/20. PCCM consulted regarding L >> R scattered ground glass infiltrates.   SIGNIFICANT EVENTS  12/16 admitted, rx'd w/ IV hydration and empirically covered w/ vanc/zosyn.  12/17: PCCM asked to see re: CT findings below.   STUDIES:  CT chest 12/16: 1. No evidence of acute pulmonary embolism.2. Mosaic profusion abnormality in the left upper lobe and left lower lobe. Question bronchiolitis with no other evidence of vascular etiology. 3. Thickened pleural rind throughout the right hemi thorax in this patient with pleural mesothelioma may be slightly more thickened possibly indicating some progression of the disease in the interval.  By: Ivar Drape M.D.   HISTORY OF PRESENT ILLNESS:    This is a 80 year old male w/ known h/o Mesothelioma involving the right pleural space, just completed chemo-therapy 4-5 wk prior to admit. Most recent re-staging scan on 11/2 was notable for new Pulmonary emboli for which he was started on Xarelto.  Presented to the Cancer center on 12/16 w/ 2 wk h/o progressive dyspnea and dry, non-productive cough. He had associated weakness, fatigue, and anorexia. He was hypotensive w/ SBP in 80s at the cancer center so he was sent to the ER for evaluation and admission. A CT of chest was obtained which showed Thickened pleural rind throughout the right hemi thorax in this patient with pleural mesothelioma & LUL and LLL pulmonary infiltrates w/ mosaic distribution these were not present on 11/2 scan.  He was admitted: treatment to date has included supplemental oxygen and empiric  antibiotics. PCCM was asked to see on 12/17 for his CT chest findings  PAST MEDICAL HISTORY :   has a past medical history of Personal history of contact with and (suspected) exposure to asbestos; Unspecified essential hypertension; Other and unspecified hyperlipidemia; Esophageal reflux; Diverticulosis of colon (without mention of hemorrhage); Elevated prostate specific antigen (PSA); Osteoarthrosis, unspecified whether generalized or localized, unspecified site; Anxiety state, unspecified; Rectal mass; Shortness of breath; Pulmonary emboli (03/10/2014); Cancer; and Mesothelioma.  has past surgical history that includes Inguinal hernia repair (1989); Colonoscopy w/ polypectomy; Video assisted thoracoscopy (vats)/decortication (Right, 09/25/2013); and Pleural effusion drainage (Right, 09/25/2013). Prior to Admission medications   Medication Sig Start Date End Date Taking? Authorizing Provider  atenolol (TENORMIN) 25 MG tablet Take 1 tablet (25 mg total) by mouth daily. 11/14/13  Yes Dorena Cookey, MD  EPINEPHrine (EPIPEN 2-PAK) 0.3 mg/0.3 mL DEVI Inject 0.3 mLs (0.3 mg total) into the muscle once. 10/10/12  Yes Dorena Cookey, MD  esomeprazole (NEXIUM) 20 MG capsule Take 20 mg by mouth every morning.    Yes Historical Provider, MD  finasteride (PROSCAR) 5 MG tablet Take 1 tablet (5 mg total) by mouth as directed. Monday, Wednesday, Friday and Sunday 11/14/13  Yes Dorena Cookey, MD  HYDROcodone-homatropine [facility]) 5-1.5 MG/5ML syrup Take 5 mLs by mouth every 6 (six) hours as needed for cough.   Yes Historical Provider, MD  ondansetron (ZOFRAN) 8 MG tablet Take 1 tablet (8 mg total) by mouth every 8 (eight) hours as needed for nausea or vomiting. 02/04/14  Yes Curt Bears, MD  oxyCODONE (OXY IR/ROXICODONE) 5 MG immediate release tablet 1 - 2 tabs PO Q 4-6 hours PRN pain. Patient taking differently: Take 5-10 mg by mouth every 4 (four) hours as needed for severe pain.  03/10/14  Yes Adrena E Johnson, PA-C   prochlorperazine (COMPAZINE) 10 MG tablet Take 1 tablet (10 mg total) by mouth every 6 (six) hours as needed for nausea or vomiting. 02/04/14  Yes Curt Bears, MD  rivaroxaban (XARELTO) 20 MG TABS tablet Take 1 tablet (20 mg total) by mouth daily with supper. 04/07/14  Yes Curt Bears, MD  terazosin (HYTRIN) 10 MG capsule Take 1 capsule (10 mg total) by mouth every evening. 11/14/13  Yes Dorena Cookey, MD  folic acid (FOLVITE) 1 MG tablet Take 1 tablet (1 mg total) by mouth daily. Patient not taking: Reported on 04/24/2014 12/16/13   Curt Bears, MD  traMADol (ULTRAM) 50 MG tablet Take 1 tablet (50 mg total) by mouth every 6 (six) hours as needed. Patient not taking: Reported on 04/14/2014 12/16/13   Curt Bears, MD  valACYclovir (VALTREX) 1000 MG tablet Take 1 tablet (1,000 mg total) by mouth 3 (three) times daily. Patient not taking: Reported on 04/22/2014 02/26/14   Drue Second, NP   Allergies  Allergen Reactions  . Other Swelling and Rash    Yellow Jackets    FAMILY HISTORY:  family history includes Colon cancer in his father; Coronary artery disease in his father and mother; Heart attack in his brother. SOCIAL HISTORY:  reports that he has never smoked. He has never used smokeless tobacco. He reports that he does not drink alcohol or use illicit drugs.  REVIEW OF SYSTEMS:   Constitutional: Negative for fever, chills, weight loss, malaise/fatigue and diaphoresis.  HENT: Negative for hearing loss, ear pain, nosebleeds, congestion, sore throat, neck pain, tinnitus and ear discharge.   Eyes: Negative for blurred vision, double vision, photophobia, pain, discharge and redness.  Respiratory: Non-productive cough, hemoptysis, no sputum production, shortness of breath, cont to worsen, wheezing and stridor.   Cardiovascular: Negative for chest pain, palpitations, orthopnea, claudication, leg swelling and PND.  Gastrointestinal: Negative for heartburn, nausea, vomiting,  abdominal pain, diarrhea, constipation, blood in stool and melena.  Genitourinary: Negative for dysuria, urgency, frequency, hematuria and flank pain.  Musculoskeletal: Negative for myalgias, back pain, joint pain and falls.  Skin: Negative for itching and rash.  Neurological: Negative for dizziness, tingling, tremors, sensory change, speech change, focal weakness, seizures, loss of consciousness, weakness and headaches.  Endo/Heme/Allergies: Negative for environmental allergies and polydipsia. Does not bruise/bleed easily.  SUBJECTIVE:  Reports O2 needs higher, still sob  VITAL SIGNS: Temp:  [98.3 F (36.8 C)-98.5 F (36.9 C)] 98.5 F (36.9 C) (12/17 0556) Pulse Rate:  [72-77] 76 (12/17 0556) Resp:  [15-20] 20 (12/17 0556) BP: (108-132)/(61-78) 108/63 mmHg (12/17 0556) SpO2:  [77 %-100 %] 77 % (12/17 1135) Weight:  [62.596 kg (138 lb)] 62.596 kg (138 lb) (12/16 1814)  PHYSICAL EXAMINATION: General:  Non-toxic appearing white male, + accessory muscle use w/ exertion  Neuro:  Anxious but no focal def  HEENT:  [place], no JVD  Cardiovascular:  Rrr, no MRG  Lungs:  Rales predom on left  Abdomen:  Soft, non-tender + bowel sounds  Musculoskeletal:  Intact  Skin:  Intact    Recent Labs Lab 04/29/2014 1121 04/17/14 0442  NA 138 140  K 4.3 4.5  CL  --  104  CO2 24 23  BUN 30.3* 26*  CREATININE 1.5* 1.48*  GLUCOSE 148* 121*    Recent Labs Lab 04/10/2014 1121 04/17/14 0442  HGB 10.9* 9.3*  HCT 34.2* 28.7*  WBC 8.3 8.3  PLT 272 265   Ct Angio Chest Pe W/cm &/or Wo Cm  04/29/2014   CLINICAL DATA:  Short of breath, history of lung carcinoma and pulmonary embolism, recently completed chemotherapy  EXAM: CT ANGIOGRAPHY CHEST WITH CONTRAST  TECHNIQUE: Multidetector CT imaging of the chest was performed using the standard protocol during bolus administration of intravenous contrast. Multiplanar CT image reconstructions and MIPs were obtained to evaluate the vascular anatomy.  CONTRAST:   156m OMNIPAQUE IOHEXOL 350 MG/ML SOLN  COMPARISON:  Chest x-ray of 04/07/2014 and CT chest of 03/03/2014  FINDINGS: The pulmonary arteries are well opacified. There is no evidence of acute pulmonary embolism. The thoracic aorta is not as well opacified but no acute abnormality is seen. The origins of the great vessels are patent. The small mediastinal lymph nodes described previously all appear completely stable. Again the thickened rind throughout the right hemi thorax is noted in this patient with a history of pleural mesothelioma. The pleural rind in several areas may be slightly thicker, particularly along the major fissure, which may indicate some interval progression of disease. Loculated fluid in the posterior medial right hemi thorax is unchanged.  On lung window images, there is airspace disease in a mosaic distribution primarily throughout much of the left upper lobe and to a lesser degree in the left lower lobe. This is a nonspecific finding, but can be seen with airway disease such as bronchiolitis or vascular occlusion. However with no pulmonary embolism evident, small airway disease would be favored. No bony abnormality is seen.  Review of the MIP images confirms the above findings.  IMPRESSION: 1. No evidence of acute pulmonary embolism. 2. Mosaic profusion abnormality in the left upper lobe and left lower lobe. Question bronchiolitis with no other evidence of vascular etiology. 3. Thickened pleural rind throughout the right hemi thorax in this patient with pleural mesothelioma may be slightly more thickened possibly indicating some progression of the disease in the interval.   Electronically Signed   By: PIvar DrapeM.D.   On: 05/01/2014 14:55   Dg Chest Portable 1 View  04/06/2014   CLINICAL DATA:  Shortness of breath, history of lung cancer.  EXAM: PORTABLE CHEST - 1 VIEW  COMPARISON:  CT chest 03/03/2014 and chest radiograph 02/10/2014.  FINDINGS: Trachea is midline. Heart size within normal  limits. Mild mixed interstitial and airspace opacification bilaterally with pleural parenchymal scarring at the base of the right hemi thorax. No left pleural fluid.  IMPRESSION: 1. Mild mixed interstitial and airspace disease may be due to a viral or atypical infectious process. 2. Pleural parenchymal scarring at the base of the right hemi thorax.   Electronically Signed   By: MLorin PicketM.D.   On: 04/04/2014 14:12    ASSESSMENT / PLAN:  Dyspnea   Known malignant Pleural Mesothelioma (right)  New mosaic distribution Left > Right sided pulmonary infiltrates. DDx includes chemo-induced bronchiolitis, atypical or opportunistic infection (although no systemic signs), lung injury in distribution of his PE's possible but very unlikely. Consider also asymmetric pulmonary edema or a chemo-related cardiomyopathy. Again the appearance on CT scan makes this very unlikely.   Recent shingles 10/28  Recent Left PE (04/02/14) on anticoagulation   Recommendations:  Send ESR Wean FIO2 as needed Agree with empiric abx for now Would favor bronchoscopy with BAL for bacterial, viral, fungal,  atypicals before treating with high-dose steroids. Note he is on high flow O2, would have some risk for resp failure associated with a procedure. I reviewed this with the patient and with his wife by phone. They understand that there is risk with either plan, steroids vs bronchoscopy. At this point we agreed to continue empiric steroids and follow clinically and by CXR. If he worsens, he would likely need intubation and MV. At such time, bronchoscopy could be performed. I have told them that we will treat with solumedrol now, follow him next 24-72h and then decide whether BAL needed.  F/u CXR Cont Xarelto for PE; should be able to safely perform BAL while on anticoagulation.  Would check TTE to evaluate LV fxn after chemo for completeness.    Erick Colace ACNP-BC [place] Pager # [phone] OR  # [phone] if no answer   Attending Note:  I have examined patient, reviewed labs, studies and notes. I have discussed the case with Jerrye Bushy and I agree with the data and plans as amended above. Pt has mosaic GG infiltrates of unclear etiology but suspect bronchiolitis. I would recommend bronchoscopy for cx's before starting steroids but this does carry risk for resp failure. Will continue the empiric steroids and follow. If no improvement or certainly if he gets intubated then will arrange FOB.   Baltazar Apo, MD, PhD 04/17/2014, 6:40 PM [facility] [phone] or if no answer [phone]

## 2014-04-17 NOTE — Assessment & Plan Note (Signed)
Albumin continues decreased.  Patient was encouraged to push protein in her diet is much as possible.

## 2014-04-17 NOTE — Assessment & Plan Note (Signed)
Blood pressure decreased to 78/46 on initial check when presenting to the [facility] today.  Patient confirms that he did take both of his blood pressure medications last night prior to bed.  Hypertension is most likely secondary to both blood pressure medications and dehydration.  IV fluid rehydration was started here in the [facility]; and patient was transferred to the emergency department for further evaluation.

## 2014-04-17 NOTE — Assessment & Plan Note (Signed)
Patient has a history of chronic renal insufficiency.  Creatinine remains slightly elevated but stable at 1.5 today.

## 2014-04-18 ENCOUNTER — Inpatient Hospital Stay (HOSPITAL_COMMUNITY): Payer: Medicare Other

## 2014-04-18 DIAGNOSIS — I359 Nonrheumatic aortic valve disorder, unspecified: Secondary | ICD-10-CM

## 2014-04-18 DIAGNOSIS — J9601 Acute respiratory failure with hypoxia: Secondary | ICD-10-CM

## 2014-04-18 DIAGNOSIS — R5381 Other malaise: Secondary | ICD-10-CM

## 2014-04-18 DIAGNOSIS — E43 Unspecified severe protein-calorie malnutrition: Secondary | ICD-10-CM

## 2014-04-18 LAB — RESPIRATORY VIRUS PANEL
Adenovirus: NOT DETECTED
INFLUENZA A H3: NOT DETECTED
INFLUENZA B 1: NOT DETECTED
Influenza A H1: NOT DETECTED
Influenza A: NOT DETECTED
Metapneumovirus: NOT DETECTED
PARAINFLUENZA 1 A: NOT DETECTED
PARAINFLUENZA 2 A: NOT DETECTED
Parainfluenza 3: NOT DETECTED
RESPIRATORY SYNCYTIAL VIRUS B: NOT DETECTED
Respiratory Syncytial Virus A: NOT DETECTED
Rhinovirus: NOT DETECTED

## 2014-04-18 LAB — PROCALCITONIN: Procalcitonin: <0.1 ng/mL

## 2014-04-18 LAB — CBC WITH DIFFERENTIAL/PLATELET
BASOS ABS: 0 10*3/uL (ref 0.0–0.1)
BASOS PCT: 0 % (ref 0–1)
EOS ABS: 0 10*3/uL (ref 0.0–0.7)
Eosinophils Relative: 0 % (ref 0–5)
HCT: 30 % — ABNORMAL LOW (ref 39.0–52.0)
Hemoglobin: 9.5 g/dL — ABNORMAL LOW (ref 13.0–17.0)
Lymphocytes Relative: 8 % — ABNORMAL LOW (ref 12–46)
Lymphs Abs: 0.7 10*3/uL (ref 0.7–4.0)
MCH: 29.8 pg (ref 26.0–34.0)
MCHC: 31.7 g/dL (ref 30.0–36.0)
MCV: 94 fL (ref 78.0–100.0)
Monocytes Absolute: 0.3 10*3/uL (ref 0.1–1.0)
Monocytes Relative: 3 % (ref 3–12)
NEUTROS ABS: 7.8 10*3/uL — AB (ref 1.7–7.7)
NEUTROS PCT: 89 % — AB (ref 43–77)
Platelets: 262 10*3/uL (ref 150–400)
RBC: 3.19 MIL/uL — ABNORMAL LOW (ref 4.22–5.81)
RDW: 15.2 % (ref 11.5–15.5)
WBC: 8.8 10*3/uL (ref 4.0–10.5)

## 2014-04-18 LAB — GLUCOSE, CAPILLARY
GLUCOSE-CAPILLARY: 104 mg/dL — AB (ref 70–99)
GLUCOSE-CAPILLARY: 129 mg/dL — AB (ref 70–99)
Glucose-Capillary: 207 mg/dL — ABNORMAL HIGH (ref 70–99)
Glucose-Capillary: 173 mg/dL — ABNORMAL HIGH (ref 70–99)

## 2014-04-18 LAB — TROPONIN I
TROPONIN I: 0.5 ng/mL — AB (ref <0.30)
Troponin I: 0.57 ng/mL (ref <0.30)

## 2014-04-18 LAB — HEPARIN LEVEL (UNFRACTIONATED): Heparin Unfractionated: >2.2 IU/mL — ABNORMAL HIGH (ref 0.30–0.70)

## 2014-04-18 LAB — APTT: APTT: 37 s (ref 24–37)

## 2014-04-18 LAB — PROTIME-INR
INR: 2.02 — ABNORMAL HIGH (ref 0.00–1.49)
Prothrombin Time: 23.1 seconds — ABNORMAL HIGH (ref 11.6–15.2)

## 2014-04-18 LAB — LACTIC ACID, PLASMA: LACTIC ACID, VENOUS: 1.8 mmol/L (ref 0.5–2.2)

## 2014-04-18 MED ORDER — VITAMINS A & D EX OINT
TOPICAL_OINTMENT | CUTANEOUS | Status: AC
Start: 2014-04-18 — End: 2014-04-18
  Administered 2014-04-18: 1
  Filled 2014-04-18: qty 5

## 2014-04-18 MED ORDER — BOOST / RESOURCE BREEZE PO LIQD
1.0000 | ORAL | Status: DC
  Administered 2014-04-18 – 2014-04-23 (×5): 1 via ORAL

## 2014-04-18 MED ORDER — BOOST PLUS PO LIQD
237.0000 mL | ORAL | Status: DC
  Administered 2014-04-19 – 2014-04-26 (×6): 237 mL via ORAL
  Filled 2014-04-18 (×11): qty 237

## 2014-04-18 MED ORDER — HEPARIN (PORCINE) IN NACL 100-0.45 UNIT/ML-% IJ SOLN
1100.0000 [IU]/h | INTRAMUSCULAR | Status: DC
  Administered 2014-04-18: 1000 [IU]/h via INTRAVENOUS
  Administered 2014-04-19 – 2014-04-21 (×3): 1150 [IU]/h via INTRAVENOUS
  Administered 2014-04-22: 1100 [IU]/h via INTRAVENOUS
  Filled 2014-04-18 (×8): qty 250

## 2014-04-18 MED ORDER — INSULIN ASPART 100 UNIT/ML ~~LOC~~ SOLN
2.0000 [IU] | SUBCUTANEOUS | Status: DC
  Administered 2014-04-18: 6 [IU] via SUBCUTANEOUS
  Administered 2014-04-18: 4 [IU] via SUBCUTANEOUS
  Administered 2014-04-19 (×3): 2 [IU] via SUBCUTANEOUS
  Administered 2014-04-19: 4 [IU] via SUBCUTANEOUS
  Administered 2014-04-19: 2 [IU] via SUBCUTANEOUS
  Administered 2014-04-20 (×2): 4 [IU] via SUBCUTANEOUS
  Administered 2014-04-20 (×2): 2 [IU] via SUBCUTANEOUS
  Administered 2014-04-20 (×2): 4 [IU] via SUBCUTANEOUS
  Administered 2014-04-21: 2 [IU] via SUBCUTANEOUS
  Administered 2014-04-21: 6 [IU] via SUBCUTANEOUS

## 2014-04-18 NOTE — Progress Notes (Signed)
ANTICOAGULATION CONSULT NOTE - Initial Consult  Pharmacy Consult for heparin Indication: history of pulmonary embolism  Allergies  Allergen Reactions  . Other Swelling and Rash    Yellow Jackets    Patient Measurements: Height: 5\' 8"  (172.7 cm) Weight: 138 lb (62.596 kg) IBW/kg (Calculated) : 68.4 Heparin Dosing Weight: 63kg  Vital Signs: Temp: 98.1 F (36.7 C) (12/18 0639) Temp Source: Oral (12/18 0639) BP: 135/72 mmHg (12/18 0639) Pulse Rate: 69 (12/18 0639)  Labs:  Recent Labs  04/18/2014 1121 04/27/2014 1121 04/17/14 0442  HGB 10.9*  --  9.3*  HCT 34.2*  --  28.7*  PLT 272  --  265  CREATININE  --  1.5* 1.48*    Estimated Creatinine Clearance: 36.4 mL/min (by C-G formula based on Cr of 1.48).   Medical History: Past Medical History  Diagnosis Date  . Personal history of contact with and (suspected) exposure to asbestos   . Unspecified essential hypertension   . Other and unspecified hyperlipidemia   . Esophageal reflux   . Diverticulosis of colon (without mention of hemorrhage)   . Elevated prostate specific antigen (PSA)   . Osteoarthrosis, unspecified whether generalized or localized, unspecified site   . Anxiety state, unspecified   . Rectal mass   . Shortness of breath     due to fluid  . Pulmonary emboli 03/10/2014  . Cancer     right lung  . Mesothelioma     Assessment: 65 yoM with PMH of mesothelioma (has just finish chemotherapy appro 4-5 weeks ago), admitted 12/16 with c/o progressive ShOB, dry cough. Pt being treated for bronchiolitis/pneumonia. Pt is still requiring high O2 requirements and is to transfer ICU/SD for a higher level of care. Patient with recent diagnosis of pulmonary embolism on 03/03/14 seen on re-staging CT, started originally on enoxaparin treatment, transitioned to rivaroxaban on 11/9, with the plan to continue anticoagulation for at least 6 months. Pharmacy is consulted to dose heparin in place of rivaroxaban due to ICU  transfer and ability to control anticoagulation status better per CCM MD.  CT chest 12/16 showed no evidence of acute pulmonary embolism   Last dose rivaroxaban 20mg  given 12/17 at [facility], will start heparin gtt 24h post- last dose of rivaroxaban  Hgb slightly below baseline at 9.3  Plts ok  CrCl 36 ml/min  Due to interaction of rivaroxaban with anti-Xa levels used to dose heparin, will check aPTTs with each anti-Xa level until aPTT and anti-Xa level correspond  Baseline anticoagulation labs  APTT 37 sec  INR 2.02  Heparin level (anti-Xa level)   Goal of Therapy:  Heparin level 0.3-0.7 units/ml aPTT 66-102 seconds Monitor platelets by anticoagulation protocol: Yes   Plan:  - will start heparin gtt with NO bolus - start heparin gtt at 1000 units/hr (16 units/kg heparin dosing weight/hr) at 1800 - 8 hour heparin level and aPTT - daily heparin level and aPTT until results correspond - daily CBC - monitor for bleeding - Pharmacy will follow-up daily  Thank you for the consult.  Currie Paris, PharmD, BCPS Pager: [phone] Pharmacy: [phone] 04/18/2014 1:33 PM

## 2014-04-18 NOTE — Progress Notes (Signed)
INITIAL NUTRITION ASSESSMENT  DOCUMENTATION CODES Per approved criteria  -Severe malnutrition in the context of chronic illness  Pt meets criteria for severe MALNUTRITION in the context of chronic illness as evidenced by 20% body weight loss in < one year, PO intake < 75% for > one month.   INTERVENTION: -Recommend Boost Plus once daily, each supplement provides 360 kcal, 14 gram protein -Recommend Resource Breeze once daily, each supplement provides 250 kcal, 9 gram protein -Reviewed nutrition therapy for taste changes/alterations -Continue with anti-emetics as scheduled -RD to continue to montior  NUTRITION DIAGNOSIS: Unintentional wt loss related to inadequate oral intake/chronic disease as evidenced by 35 lb wt loss in < one year.   Goal: Pt to meet >/= 90% of their estimated nutrition needs    Monitor:  Total protein/energy intake, labs, weight, supplement tolerance  Reason for Assessment: MST  80 y.o. male  Admitting Dx: SOB (shortness of breath)  ASSESSMENT: Tell Rozelle. is a 80 y.o. male with PMH of mesothelioma (has just finish chemotherapy appro 4-5 weeks ago), HTN, BPH, GERD, PE; who presented to ED complaining of SOB  -Pt endorsed unintentional wt loss of 35 lbs since 08/2013 (20% body weight loss, severe for time frame) -Pt's wife reported pt with poor PO intake d/t nausea, taste changes, metallic tastes and decreased appetite -Diet recall indicates pt tolerating small meals and supplementing with Boost occasionally -Had been contacted by outpatient oncology RD on 04/04/2014 d/t wt loss; and RD reviewed suggestions for improving tastes -Pt reported to have tried some suggestions; but did not find much improvement. -Encouraged continued use of supplements, with addition of coffee/instant granules to prevent over-sweet -Pt also reported tolerating Resource Breeze w/out nausea. Will order once daily -Appetite improving during admit, consumed 75% of  meal -Phos/K/Mg WNL   Height: Ht Readings from Last 1 Encounters:  04/22/2014 5\' 8"  (1.727 m)    Weight: Wt Readings from Last 1 Encounters:  04/05/2014 138 lb (62.596 kg)    Ideal Body Weight: 154 lb  % Ideal Body Weight: 90%  Wt Readings from Last 10 Encounters:  04/27/2014 138 lb (62.596 kg)  03/10/14 144 lb 9.6 oz (65.59 kg)  02/26/14 147 lb (66.679 kg)  02/10/14 148 lb 6.4 oz (67.314 kg)  01/13/14 152 lb 8 oz (69.174 kg)  12/17/13 153 lb 8 oz (69.627 kg)  12/03/13 154 lb 14.4 oz (70.262 kg)  11/26/13 155 lb 12.8 oz (70.67 kg)  11/14/13 153 lb (69.4 kg)  11/12/13 150 lb (68.04 kg)    Usual Body Weight: ~175 lb  % Usual Body Weight: 79%  BMI:  Body mass index is 20.99 kg/(m^2).  Estimated Nutritional Needs: Kcal: 1900-2100 Protein: 90-100 gram Fluid: >/=1900 ml daily  Skin: WDL  Diet Order: Diet regular  EDUCATION NEEDS: -Education needs addressed   Intake/Output Summary (Last 24 hours) at 04/18/14 1147 Last data filed at 04/18/14 0639  Gross per 24 hour  Intake    150 ml  Output    680 ml  Net   -530 ml    Last BM: 12.16   Labs:   Recent Labs Lab 04/01/2014 1121 04/23/2014 1852 04/17/14 0442  NA 138  --  140  K 4.3  --  4.5  CL  --   --  104  CO2 24  --  23  BUN 30.3*  --  26*  CREATININE 1.5*  --  1.48*  CALCIUM 9.8  --  9.0  MG  --  2.2  --   PHOS  --  3.9  --   GLUCOSE 148*  --  121*    CBG (last 3)  No results for input(s): GLUCAP in the last 72 hours.  Scheduled Meds: . antiseptic oral rinse  7 mL Mouth Rinse q12n4p  . budesonide (PULMICORT) nebulizer solution  0.25 mg Nebulization BID  . chlorhexidine  15 mL Mouth Rinse BID  . feeding supplement (RESOURCE BREEZE)  1 Container Oral Q24H  . finasteride  5 mg Oral Once per day on Sun Mon Wed Fri  . folic acid  1 mg Oral Daily  . guaiFENesin  600 mg Oral BID  . insulin aspart  2-6 Units Subcutaneous 6 times per day  . [START ON 04/19/2014] lactose free nutrition  237 mL Oral Q24H   . methylPREDNISolone (SOLU-MEDROL) injection  60 mg Intravenous Q6H  . pantoprazole  40 mg Oral Daily  . piperacillin-tazobactam (ZOSYN)  IV  3.375 g Intravenous Q8H  . terazosin  10 mg Oral QPM    Continuous Infusions:   Past Medical History  Diagnosis Date  . Personal history of contact with and (suspected) exposure to asbestos   . Unspecified essential hypertension   . Other and unspecified hyperlipidemia   . Esophageal reflux   . Diverticulosis of colon (without mention of hemorrhage)   . Elevated prostate specific antigen (PSA)   . Osteoarthrosis, unspecified whether generalized or localized, unspecified site   . Anxiety state, unspecified   . Rectal mass   . Shortness of breath     due to fluid  . Pulmonary emboli 03/10/2014  . Cancer     right lung  . Mesothelioma     Past Surgical History  Procedure Laterality Date  . Inguinal hernia repair  1989    left  . Colonoscopy w/ polypectomy    . Video assisted thoracoscopy (vats)/decortication Right 09/25/2013    Procedure: VIDEO ASSISTED THORACOSCOPY (VATS)/POSSIBLE DECORTICATION;  Surgeon: Melrose Nakayama, MD;  Location: [facility];  Service: Thoracic;  Laterality: Right;  . Pleural effusion drainage Right 09/25/2013    Procedure: DRAINAGE OF PLEURAL EFFUSION;  Surgeon: Melrose Nakayama, MD;  Location: [facility];  Service: Thoracic;  Laterality: Right;    Atlee Abide MS RD LDN Clinical Dietitian [phone]

## 2014-04-18 NOTE — Consult Note (Addendum)
Name: Sean Mitchell. MRN: [id number] DOB: 1933-05-15    ADMISSION DATE:  04/18/2014 CONSULTATION DATE:  12/17  REFERRING MD :  Charlies Silvers   CHIEF COMPLAINT/reason for consult:  Dyspnea and pulmonary infiltrates   BRIEF PATIENT DESCRIPTION:  This is a 80 year old male w/ known h/o Mesothelioma involving the right pleural space, just completed chemo-therapy 4-6 wk prior to admit. Sine then progressive dyspnea on exertion and fatigue. Then on 02/26/14 developed shingles NOS - Rx +/  Most recent re-staging scan on 11/2 was notable for new Pulmonary emboli lingula and left lower lobe (new since aug 2015) for which he was started on Xarelto.  Presented to the Cancer center on 12/16 w/ 2 wk h/o progressive dyspnea and dry, non-productive coughwith further worsening over 2-3 days. He had associated weakness, fatigue, and anorexia. He was hypotensive w/ SBP in 80s at the cancer center so he was sent to the ER for evaluation and admission. A CT of chest was obtained which showed Thickened pleural rind throughout the right hemi thorax in this patient with pleural mesothelioma & LUL and LLL pulmonary infiltrates w/ mosaic distribution these were not present on 11/2 scan.  He was admitted: treatment to date has included supplemental oxygen and empiric antibiotics. PCCM was asked to see on 12/17 for his CT chest findingsalong with dramatic worsening with resp distress and hypoxemia on 04/17/14   SIGNIFICANT EVENTS  And STUDIES 12/16 admitted, rx'd w/ IV hydration and empirically covered w/ vanc/zosyn.    CT chest 12/16: 1. No evidence of acute pulmonary embolism.2. Mosaic profusion abnormality in the left upper lobe and left lower lobe. Question bronchiolitis with no other evidence of vascular etiology. 3. Thickened pleural rind throughout the right hemi thorax in this patient with pleural mesothelioma may be slightly more thickened possibly indicating some progression of the disease in the interval.      12/17: PCCM asked to see re: CT above and worsening hypoxemia -  > started empiric steroids  - Resp virus pane  - Urine leg/strep  - HIV - negative  - probnp 326  SUBJECTIVE/OVERNIGHT/INTERVAL HX 04/18/14:  Now on 7L at rest. Initially he and family said not better but later they said they think steroids helping (colore better, slept better, ate better). Still desats significantly even with minimal exertion such as transfer or toilet. Also possible hematuria while bein on xarelto    VITAL SIGNS: Temp:  [97.4 F (36.3 C)-99.3 F (37.4 C)] 98.1 F (36.7 C) (12/18 0639) Pulse Rate:  [69-88] 69 (12/18 0639) Resp:  [18-22] 22 (12/18 0639) BP: (132-143)/(69-76) 135/72 mmHg (12/18 0639) SpO2:  [68 %-95 %] 68 % (12/18 1015)  PHYSICAL EXAMINATION: General:  Non-toxic appearing white male,  Neuro:  Anxious but no focal def  HEENT:  [place], no JVD  Cardiovascular:  Rrr, no MRG  Lungs:  Rales predom on left . No increased wob (? Better since yesterday) Abdomen:  Soft, non-tender + bowel sounds  Musculoskeletal:  Intact  Skin:  Intact    PULMONARY No results for input(s): PHART, PCO2ART, PO2ART, HCO3, TCO2, O2SAT in the last 168 hours.  Invalid input(s): PCO2, PO2  CBC  Recent Labs Lab 04/09/2014 1121 04/17/14 0442  HGB 10.9* 9.3*  HCT 34.2* 28.7*  WBC 8.3 8.3  PLT 272 265    COAGULATION No results for input(s): INR in the last 168 hours.  CARDIAC  No results for input(s): TROPONINI in the last 168 hours.  Recent Labs Lab  04/14/2014 1312  PROBNP 326.8     CHEMISTRY  Recent Labs Lab 04/17/2014 1121 04/08/2014 1852 04/17/14 0442  NA 138  --  140  K 4.3  --  4.5  CL  --   --  104  CO2 24  --  23  GLUCOSE 148*  --  121*  BUN 30.3*  --  26*  CREATININE 1.5*  --  1.48*  CALCIUM 9.8  --  9.0  MG  --  2.2  --   PHOS  --  3.9  --    Estimated Creatinine Clearance: 36.4 mL/min (by C-G formula based on Cr of 1.48).   LIVER  Recent Labs Lab 04/20/2014 1121  04/08/2014 1852  AST 23 25  ALT 9 9  ALKPHOS 97 110  BILITOT 0.37 0.2*  PROT 7.1 7.6  ALBUMIN 2.5* 2.8*     INFECTIOUS  Recent Labs Lab 04/30/2014 1322  LATICACIDVEN 2.55*     ENDOCRINE CBG (last 3)  No results for input(s): GLUCAP in the last 72 hours.       IMAGING x48h Ct Angio Chest Pe W/cm &/or Wo Cm  04/08/2014   CLINICAL DATA:  Short of breath, history of lung carcinoma and pulmonary embolism, recently completed chemotherapy  EXAM: CT ANGIOGRAPHY CHEST WITH CONTRAST  TECHNIQUE: Multidetector CT imaging of the chest was performed using the standard protocol during bolus administration of intravenous contrast. Multiplanar CT image reconstructions and MIPs were obtained to evaluate the vascular anatomy.  CONTRAST:  137mL OMNIPAQUE IOHEXOL 350 MG/ML SOLN  COMPARISON:  Chest x-ray of 04/11/2014 and CT chest of 03/03/2014  FINDINGS: The pulmonary arteries are well opacified. There is no evidence of acute pulmonary embolism. The thoracic aorta is not as well opacified but no acute abnormality is seen. The origins of the great vessels are patent. The small mediastinal lymph nodes described previously all appear completely stable. Again the thickened rind throughout the right hemi thorax is noted in this patient with a history of pleural mesothelioma. The pleural rind in several areas may be slightly thicker, particularly along the major fissure, which may indicate some interval progression of disease. Loculated fluid in the posterior medial right hemi thorax is unchanged.  On lung window images, there is airspace disease in a mosaic distribution primarily throughout much of the left upper lobe and to a lesser degree in the left lower lobe. This is a nonspecific finding, but can be seen with airway disease such as bronchiolitis or vascular occlusion. However with no pulmonary embolism evident, small airway disease would be favored. No bony abnormality is seen.  Review of the MIP images  confirms the above findings.  IMPRESSION: 1. No evidence of acute pulmonary embolism. 2. Mosaic profusion abnormality in the left upper lobe and left lower lobe. Question bronchiolitis with no other evidence of vascular etiology. 3. Thickened pleural rind throughout the right hemi thorax in this patient with pleural mesothelioma may be slightly more thickened possibly indicating some progression of the disease in the interval.   Electronically Signed   By: Ivar Drape M.D.   On: 04/12/2014 14:55   Dg Chest Port 1 View  04/18/2014   CLINICAL DATA:  Follow up infiltrates. Shortness of breath with history of lung cancer and pulmonary embolism. Recently completed chemotherapy.  EXAM: PORTABLE CHEST - 1 VIEW  COMPARISON:  CT 04/30/2014.  Radiographs 02/10/2014 and 04/24/2014.  FINDINGS: 0457 hr. There are slightly lower lung volumes. The heart size and mediastinal contours are stable.  There is chronic pleural thickening on the right. There are increased generalized interstitial markings, left greater than right. There is no confluent airspace opacity, significant pleural effusion or pneumothorax. The bones appear unchanged.  IMPRESSION: Diffusely increased interstitial prominence most consistent with edema, drug reaction or atypical infection given the recent worsening.   Electronically Signed   By: Camie Patience M.D.   On: 04/18/2014 07:15   Dg Chest Portable 1 View  04/27/2014   CLINICAL DATA:  Shortness of breath, history of lung cancer.  EXAM: PORTABLE CHEST - 1 VIEW  COMPARISON:  CT chest 03/03/2014 and chest radiograph 02/10/2014.  FINDINGS: Trachea is midline. Heart size within normal limits. Mild mixed interstitial and airspace opacification bilaterally with pleural parenchymal scarring at the base of the right hemi thorax. No left pleural fluid.  IMPRESSION: 1. Mild mixed interstitial and airspace disease may be due to a viral or atypical infectious process. 2. Pleural parenchymal scarring at the base of  the right hemi thorax.   Electronically Signed   By: Lorin Picket M.D.   On: 04/02/2014 14:12        ASSESSMENT / PLAN:  PULMONARY OETT A: Baseline  - Known malignant Pleural Mesothelioma (right) - Recent Incidental Left PE (03/03/14) on anticoagulation with xarelto  Current - Acute Hypoxemic Respiratory Failure with subacute hx - New mosaic distribution Left > Right sided pulmonary infiltrates. DDx includes   - chemo-induced bronchiolitis, atypical or opportunistic infection (although no systemic signs),   - lung injury in distribution of his PE's possible but very unlikely.   - Consider also asymmetric pulmonary edema or a chemo-related cardiomyopathy.  - Respiratory Virus   - Rule out CAP    -  Started empiric steroids 04/17/14 - ? Better v Worse (unclear) but needing 4-7L o2 on 04/18/2014   P:   O2 for pusle ox > 88% Empiric steroids IV  Empiric Abx (see ID) Move to SDU - consider bipap Empiric Lasix  X 1 (wife needed some counseling to accept this strategy) Change xaretlo to IV heparin due to ICU move and ability to control anticoag status better Hold off bronch now (risk v benefit issue) ; consider bronch depending on course  CARDIOVASCULAR CVL none A: Hx of Hypertension Hypotensive at admit but normotensive and tolerating atenolol as of 04/18/14   - wife wants atenolol dc'ed due to hypotension at admit P:  Dc atenolol  Watch HR and BP off atenolol  RENAL A:   Baseline  - BPH on proscar and hytrin Current  - mild lactic acidosis at admit 04/15/2014  ? Mild hematuria v brpr 04/18/14  P:   Continue bph meds Recheck lactate See heme section regarding bleeding  GASTROINTESTINAL A: Hx of diverticulosis  04/18/14: ? BRPR - wife unsure  P:   PPI Monitor for bleeding  ONCOLOGY A : Mesothelioma P monitor  HEMATOLOGIC A:   ? hematura v BRPR Also protein calorie malnutrition P:  PRBC per ICU guidelines Ok to eat now but if worse make  NPO  INFECTIOUS A:  ? Viral V BAct PNA. On empiric abx P:   Zosyn to continue Dc vanc due to MRSA PCR negative  Check PCT and adjust Zosyn accordingly  ENDOCRINE A:  Risk of hyperglycemia on steroids P:   ssi  NEUROLOGIC A:  Inact. ? Chronic pain - on oxy prn P:   Monitor  Pain relief  FAMILY  - Updates: xtensively updated and counseled 04/18/14  - Inter-disciplinary family meet  or Palliative Care meeting due by: 12./23/15 . On 04/18/14 had IDT meet at bedside with patient, RN, wife, and daughter - agrees to move to SDU for continued investigation and full medical care./ Full code to continue. PCCM primary from 04/18/14   CODE    Code Status Orders        Start     Ordered   04/29/2014 1832  Full code   Continuous     04/26/2014 1835    Advance Directive Documentation        Most Recent Value   Type of Advance Directive  Living will, Healthcare Power of Attorney   Pre-existing out of facility DNR order (yellow form or pink MOST form)     "MOST" Form in Place?         The patient is critically ill with multiple organ systems failure and requires high complexity decision making for assessment and support, frequent evaluation and titration of therapies, application of advanced monitoring technologies and extensive interpretation of multiple databases.   Critical Care Time devoted to patient care services described in this note is  45 Minutes. This time reflects time of care of this signee Dr Brand Males. This critical care time does not reflect procedure time, or teaching time or supervisory time of PA/NP/Med student/Med Resident etc but could involve care discussion time    Dr. Brand Males, M.D., [facility].C.P Pulmonary and Critical Care Medicine Staff Physician [facility] Pager: [phone], If no answer or between  15:00h - 7:00h: call [phone]  04/18/2014 11:34 AM

## 2014-04-18 NOTE — Telephone Encounter (Signed)
error 

## 2014-04-18 NOTE — Progress Notes (Signed)
CRITICAL VALUE ALERT  Critical value received:  Troponin 0.57  Date of notification:  04/18/2014  Time of notification:  1258  Critical value read back:  Yes  Nurse who received alert: Jerrel Ivory RN/Pam Melina Modena RN  MD notified (1st page):  Marni Griffon, NP  Time of first page:  1302  MD notified (2nd page):  Time of second page:  Responding MD:  Marni Griffon, NP  Time MD responded:  [phone]

## 2014-04-18 NOTE — Progress Notes (Signed)
Small amount of rectal bleeding with bowel movement. Scant amount of dried blood in nose.  About to initiate heparin drip.   Urine output from 7 am to 6 pm 175 ml.  Notified Dr. Lake Bells, PCCM, Elink.

## 2014-04-18 NOTE — Progress Notes (Signed)
  Echocardiogram 2D Echocardiogram has been performed.  Palmira Stickle FRANCES 04/18/2014, 3:54 PM

## 2014-04-19 DIAGNOSIS — J9600 Acute respiratory failure, unspecified whether with hypoxia or hypercapnia: Secondary | ICD-10-CM

## 2014-04-19 DIAGNOSIS — Z8529 Personal history of malignant neoplasm of other respiratory and intrathoracic organs: Secondary | ICD-10-CM

## 2014-04-19 LAB — TROPONIN I: TROPONIN I: 1.23 ng/mL — AB (ref <0.30)

## 2014-04-19 LAB — CBC WITH DIFFERENTIAL/PLATELET
Basophils Absolute: 0 10*3/uL (ref 0.0–0.1)
Basophils Relative: 0 % (ref 0–1)
EOS PCT: 0 % (ref 0–5)
Eosinophils Absolute: 0 10*3/uL (ref 0.0–0.7)
HEMATOCRIT: 27.8 % — AB (ref 39.0–52.0)
HEMOGLOBIN: 9 g/dL — AB (ref 13.0–17.0)
LYMPHS ABS: 0.8 10*3/uL (ref 0.7–4.0)
LYMPHS PCT: 7 % — AB (ref 12–46)
MCH: 30.1 pg (ref 26.0–34.0)
MCHC: 32.4 g/dL (ref 30.0–36.0)
MCV: 93 fL (ref 78.0–100.0)
MONO ABS: 0.4 10*3/uL (ref 0.1–1.0)
MONOS PCT: 4 % (ref 3–12)
Neutro Abs: 10.2 10*3/uL — ABNORMAL HIGH (ref 1.7–7.7)
Neutrophils Relative %: 89 % — ABNORMAL HIGH (ref 43–77)
Platelets: 262 10*3/uL (ref 150–400)
RBC: 2.99 MIL/uL — AB (ref 4.22–5.81)
RDW: 15.3 % (ref 11.5–15.5)
WBC: 11.4 10*3/uL — AB (ref 4.0–10.5)

## 2014-04-19 LAB — APTT
APTT: 74 s — AB (ref 24–37)
APTT: 61 s — AB (ref 24–37)
aPTT: 85 seconds — ABNORMAL HIGH (ref 24–37)

## 2014-04-19 LAB — PROCALCITONIN: Procalcitonin: <0.1 ng/mL

## 2014-04-19 LAB — GLUCOSE, CAPILLARY
GLUCOSE-CAPILLARY: 145 mg/dL — AB (ref 70–99)
GLUCOSE-CAPILLARY: 148 mg/dL — AB (ref 70–99)
GLUCOSE-CAPILLARY: 157 mg/dL — AB (ref 70–99)
Glucose-Capillary: 107 mg/dL — ABNORMAL HIGH (ref 70–99)
Glucose-Capillary: 135 mg/dL — ABNORMAL HIGH (ref 70–99)

## 2014-04-19 LAB — BASIC METABOLIC PANEL
Anion gap: 14 (ref 5–15)
BUN: 36 mg/dL — AB (ref 6–23)
CHLORIDE: 102 meq/L (ref 96–112)
CO2: 21 mEq/L (ref 19–32)
CREATININE: 1.5 mg/dL — AB (ref 0.50–1.35)
Calcium: 9.5 mg/dL (ref 8.4–10.5)
GFR calc non Af Amer: 43 mL/min — ABNORMAL LOW (ref >90)
GFR, EST AFRICAN AMERICAN: 50 mL/min — AB (ref >90)
Glucose, Bld: 148 mg/dL — ABNORMAL HIGH (ref 70–99)
Potassium: 4.1 mEq/L (ref 3.7–5.3)
Sodium: 137 mEq/L (ref 137–147)

## 2014-04-19 LAB — PROTIME-INR
INR: 1.45 (ref 0.00–1.49)
Prothrombin Time: 17.8 seconds — ABNORMAL HIGH (ref 11.6–15.2)

## 2014-04-19 LAB — HEPARIN LEVEL (UNFRACTIONATED)
Heparin Unfractionated: 0.91 IU/mL — ABNORMAL HIGH (ref 0.30–0.70)
Heparin Unfractionated: 0.62 IU/mL (ref 0.30–0.70)
Heparin Unfractionated: 0.45 IU/mL (ref 0.30–0.70)

## 2014-04-19 LAB — MAGNESIUM: Magnesium: 1.9 mg/dL (ref 1.5–2.5)

## 2014-04-19 LAB — PHOSPHORUS: Phosphorus: 4.8 mg/dL — ABNORMAL HIGH (ref 2.3–4.6)

## 2014-04-19 MED ORDER — LORAZEPAM 1 MG PO TABS
1.0000 mg | ORAL_TABLET | Freq: Four times a day (QID) | ORAL | Status: DC | PRN
  Administered 2014-04-19 – 2014-04-21 (×4): 1 mg via ORAL
  Filled 2014-04-19 (×4): qty 1

## 2014-04-19 MED ORDER — ASPIRIN 325 MG PO TABS
325.0000 mg | ORAL_TABLET | Freq: Every day | ORAL | Status: DC
  Administered 2014-04-19 – 2014-04-23 (×5): 325 mg via ORAL
  Filled 2014-04-19 (×5): qty 1

## 2014-04-19 MED ORDER — SODIUM CHLORIDE 0.9 % IJ SOLN
10.0000 mL | Freq: Two times a day (BID) | INTRAMUSCULAR | Status: DC
  Administered 2014-04-19: 10 mL
  Administered 2014-04-20: 30 mL
  Administered 2014-04-20: 20 mL
  Administered 2014-04-21: 10 mL
  Administered 2014-04-21: 30 mL
  Administered 2014-04-22 – 2014-04-23 (×3): 10 mL
  Administered 2014-04-23: 30 mL
  Administered 2014-04-24 (×2): 10 mL
  Administered 2014-04-25: 20 mL
  Administered 2014-04-25: 10 mL
  Administered 2014-04-26: 20 mL
  Administered 2014-04-26 – 2014-04-27 (×2): 10 mL

## 2014-04-19 MED ORDER — SODIUM CHLORIDE 0.9 % IJ SOLN: 10.0000 mL | INTRAMUSCULAR | Status: DC | PRN

## 2014-04-19 NOTE — Progress Notes (Signed)
Subjective: The patient is seen and examined this morning. He is feeling a little bit better but becomes short of breath with minimal exertion. He was transferred last night to the ICU for close monitoring. He denied having any fever or chills. No nausea or vomiting.  Objective: Vital signs in last 24 hours: Temp:  [97.3 F (36.3 C)-97.7 F (36.5 C)] 97.3 F (36.3 C) (12/19 0831) Pulse Rate:  [54-72] 54 (12/19 0800) Resp:  [17-30] 19 (12/19 0800) BP: (102-131)/(47-64) 107/49 mmHg (12/19 0800) SpO2:  [68 %-99 %] 95 % (12/19 0800) FiO2 (%):  [55 %] 55 % (12/18 1938) Weight:  [143 lb 11.8 oz (65.2 kg)-148 lb 2.4 oz (67.2 kg)] 143 lb 11.8 oz (65.2 kg) (12/19 7564)  Intake/Output from previous day: 12/18 0701 - 12/19 0700 In: 270 [I.V.:120; IV Piggyback:150] Out: 425 [Urine:425] Intake/Output this shift:    General appearance: alert, cooperative, fatigued and no distress Resp: diminished breath sounds RLL and RML and dullness to percussion RLL and RML Cardio: regular rate and rhythm, S1, S2 normal, no murmur, click, rub or gallop GI: soft, non-tender; bowel sounds normal; no masses,  no organomegaly Extremities: extremities normal, atraumatic, no cyanosis or edema  Lab Results:   Recent Labs  04/18/14 1209 04/19/14 0245  WBC 8.8 11.4*  HGB 9.5* 9.0*  HCT 30.0* 27.8*  PLT 262 262   BMET  Recent Labs  04/17/14 0442 04/19/14 0245  NA 140 137  K 4.5 4.1  CL 104 102  CO2 23 21  GLUCOSE 121* 148*  BUN 26* 36*  CREATININE 1.48* 1.50*  CALCIUM 9.0 9.5    Studies/Results: Dg Chest Port 1 View  04/18/2014   CLINICAL DATA:  Follow up infiltrates. Shortness of breath with history of lung cancer and pulmonary embolism. Recently completed chemotherapy.  EXAM: PORTABLE CHEST - 1 VIEW  COMPARISON:  CT 04/02/2014.  Radiographs 02/10/2014 and 04/13/2014.  FINDINGS: 0457 hr. There are slightly lower lung volumes. The heart size and mediastinal contours are stable. There is chronic  pleural thickening on the right. There are increased generalized interstitial markings, left greater than right. There is no confluent airspace opacity, significant pleural effusion or pneumothorax. The bones appear unchanged.  IMPRESSION: Diffusely increased interstitial prominence most consistent with edema, drug reaction or atypical infection given the recent worsening.   Electronically Signed   By: Camie Patience M.D.   On: 04/18/2014 07:15    Medications: I have reviewed the patient's current medications.   Assessment/Plan: 1) history of right malignant pleural mesothelioma: He is status post 6 cycles of systemic chemotherapy with cisplatin and Alimta with partial/stable response. He is currently on observation. 2) acute respiratory failure: Secondary to his disease in the right lung in addition to new findings with questionable bronchiolitis/pneumonitis in the left lung. I'm not sure if this is an adverse effect from the previous chemotherapy but this cannot be also completely excluded. He is currently followed by pulmonary medicine. He is evaluated for hypersensitivity reaction versus opportunistic infection. He is currently on Solu-Medrol in addition to antibiotic coverage with Zosyn. 3) elevated troponin: Probably secondary to cardiac strain from the current condition. He is monitored by the critical care team. Consider cardiac evaluation if getting worse. 4) history of incidental findings of pulmonary embolism: Currently on treatment with Xarelto. The recent CT angiogram of the chest showed resolution of his pulmonary embolism.  LOS: 3 days    Tricha Ruggirello K. 04/19/2014

## 2014-04-19 NOTE — Progress Notes (Signed)
Patient IV infiltrated. IV placement has been difficult for this patient.  #24 IV placed.  # 22 maintained. Notified Dr. Lamonte Sakai.

## 2014-04-19 NOTE — Progress Notes (Signed)
ANTICOAGULATION CONSULT NOTE -  Pharmacy Consult for heparin Indication: history of pulmonary embolism  Allergies  Allergen Reactions  . Other Swelling and Rash    Yellow Jackets    Patient Measurements: Height: 5\' 8"  (172.7 cm) Weight: 143 lb 11.8 oz (65.2 kg) IBW/kg (Calculated) : 68.4 Heparin Dosing Weight: 67kg  Vital Signs: Temp: 97.3 F (36.3 C) (12/19 0831) Temp Source: Oral (12/19 0831) BP: 107/49 mmHg (12/19 0800) Pulse Rate: 54 (12/19 0800)  Labs:  Recent Labs  04/17/14 0442 04/18/14 1209 04/18/14 1926 04/19/14 0245 04/19/14 1125 04/19/14 1144  HGB 9.3* 9.5*  --  9.0*  --   --   HCT 28.7* 30.0*  --  27.8*  --   --   PLT 265 262  --  262  --   --   APTT  --  37  --  74*  --  61*  LABPROT  --  23.1*  --  17.8*  --   --   INR  --  2.02*  --  1.45  --   --   HEPARINUNFRC  --  >2.20*  --  0.91* 0.62  --   CREATININE 1.48*  --   --  1.50*  --   --   TROPONINI  --  0.57* 0.50* 1.23*  --   --     Estimated Creatinine Clearance: 37.4 mL/min (by C-G formula based on Cr of 1.5).   Medical History: Past Medical History  Diagnosis Date  . Personal history of contact with and (suspected) exposure to asbestos   . Unspecified essential hypertension   . Other and unspecified hyperlipidemia   . Esophageal reflux   . Diverticulosis of colon (without mention of hemorrhage)   . Elevated prostate specific antigen (PSA)   . Osteoarthrosis, unspecified whether generalized or localized, unspecified site   . Anxiety state, unspecified   . Rectal mass   . Shortness of breath     due to fluid  . Pulmonary emboli 03/10/2014  . Cancer     right lung  . Mesothelioma     Assessment: 48 yoM with PMH of mesothelioma (has just finish chemotherapy appro 4-5 weeks ago), admitted 12/16 with c/o progressive ShOB, dry cough. Pt being treated for bronchiolitis/pneumonia. Pt is still requiring high O2 requirements and is to transfer ICU/SD for a higher level of care. Patient with  recent diagnosis of pulmonary embolism on 03/03/14 seen on re-staging CT, started originally on enoxaparin treatment, transitioned to rivaroxaban on 11/9, with the plan to continue anticoagulation for at least 6 months. Pharmacy is consulted to dose heparin in place of rivaroxaban due to ICU transfer and ability to control anticoagulation status better per CCM MD. CT chest 12/16 showed no evidence of acute pulmonary embolism Last dose rivaroxaban 20mg  given 12/17 at [place], heparin drip was started 12/18 at 1815.  Due to interaction of rivaroxaban with anti-Xa levels used to dose heparin, will check aPTTs with each anti-Xa level until aPTT and anti-Xa level correspond   Goal of Therapy:  Heparin level 0.3-0.7 units/ml aPTT 66-102 seconds Monitor platelets by anticoagulation protocol: Yes   Today, 12/19: aPTT subtherapeutic on heparin 1000 units/hr IV infusion. Heparin level still likely being affected by residual rivaroxaban. Per RN report, no further bleeding noted since small about of rectal bleeding with a bowel movement (prior to initiation of heparin). Hgb slightly low.  Pltc WNL.  Plan:  1. Increase Heparin to 1150 units/hr 2. Repeat aPTT and heparin level at  7pm 3. Monitor for evidence of bleeding 4. Continue to use aPTT for heparin dosing until rivaroxaban effect fully subsides and aPTT values correlate with anti-Xa levels.  Clayburn Pert, PharmD, BCPS Pager: [phone] 04/19/2014  12:56 PM

## 2014-04-19 NOTE — Progress Notes (Signed)
CRITICAL VALUE ALERT  Critical value troponin   Date of notification:  12/19  Time of notification:  0300  Critical value read back:yes Nurse who received alert:   MD notified (1st page):  elink  Time of first page:  0300  MD notified (2nd page):  Time of second page:  Responding MD:   Time MD responded:

## 2014-04-19 NOTE — Consult Note (Signed)
Name: Sean Mitchell. MRN: [id number] DOB: June 10, 1933    ADMISSION DATE:  04/06/2014 CONSULTATION DATE:  12/17  REFERRING MD :  Charlies Silvers   CHIEF COMPLAINT/reason for consult:  Dyspnea and pulmonary infiltrates   BRIEF PATIENT DESCRIPTION:  This is a 80 year old male s/p 6 cycles of systemic chemo w/ Cisplatin and Alimta for right malignant pleural Mesothelioma. His last round of Chemo was 10/20. PCCM consulted regarding L >> R scattered ground glass infiltrates.   SIGNIFICANT EVENTS  12/16 admitted, rx'd w/ IV hydration and empirically covered w/ vanc/zosyn.  12/17: PCCM asked to see re: CT findings below.   STUDIES:  CT chest 12/16: 1. No evidence of acute pulmonary embolism.2. Mosaic profusion abnormality in the left upper lobe and left lower lobe. Question bronchiolitis with no other evidence of vascular etiology. 3. Thickened pleural rind throughout the right hemi thorax in this patient with pleural mesothelioma may be slightly more thickened possibly indicating some progression of the disease in the interval. Echo 12/18 > LVEF 60%, mild MR, RV normal   SUBJECTIVE:  Comfortable, still dyspneic with any activity; denies any chest pain; denies cough   VITAL SIGNS: Temp:  [97.3 F (36.3 C)-97.7 F (36.5 C)] 97.3 F (36.3 C) (12/19 0831) Pulse Rate:  [57-72] 57 (12/19 0400) Resp:  [17-30] 17 (12/19 0400) BP: (102-131)/(47-64) 131/60 mmHg (12/19 0400) SpO2:  [68 %-99 %] 96 % (12/19 0400) FiO2 (%):  [55 %] 55 % (12/18 1938) Weight:  [65.2 kg (143 lb 11.8 oz)-67.2 kg (148 lb 2.4 oz)] 65.2 kg (143 lb 11.8 oz) (12/19 7425)  PHYSICAL EXAMINATION:  Gen: breathing comfortably while asleep, shallow respirations while awake HEENT: NCAT, EOMi PULM: Crackles L lung, diminished R lung CV: RRR, no mgr AB: BS+, soft, nontender Ext: warm, no edema Neuro: A&Ox4, maew   Recent Labs Lab 04/18/2014 1121 04/17/14 0442 04/19/14 0245  NA 138 140 137  K 4.3 4.5 4.1  CL  --  104 102   CO2 24 23 21   BUN 30.3* 26* 36*  CREATININE 1.5* 1.48* 1.50*  GLUCOSE 148* 121* 148*    Recent Labs Lab 04/17/14 0442 04/18/14 1209 04/19/14 0245  HGB 9.3* 9.5* 9.0*  HCT 28.7* 30.0* 27.8*  WBC 8.3 8.8 11.4*  PLT 265 262 262   Dg Chest Port 1 View  04/18/2014   CLINICAL DATA:  Follow up infiltrates. Shortness of breath with history of lung cancer and pulmonary embolism. Recently completed chemotherapy.  EXAM: PORTABLE CHEST - 1 VIEW  COMPARISON:  CT 04/10/2014.  Radiographs 02/10/2014 and 04/05/2014.  FINDINGS: 0457 hr. There are slightly lower lung volumes. The heart size and mediastinal contours are stable. There is chronic pleural thickening on the right. There are increased generalized interstitial markings, left greater than right. There is no confluent airspace opacity, significant pleural effusion or pneumothorax. The bones appear unchanged.  IMPRESSION: Diffusely increased interstitial prominence most consistent with edema, drug reaction or atypical infection given the recent worsening.   Electronically Signed   By: Camie Patience M.D.   On: 04/18/2014 07:15   ASSESSMENT / PLAN:  PULMONARY OETT A: Acute Hypoxemic Respiratory Failure with subacute hx with L>R central GGO; likely bronchiolitis/pneumonitis from pemetrexed (many case reports of various forms of lung toxicity that could look like this) vs. cisplatin (reported to cause eosinophilic pneumonia and hypersensitivity reactions) vs less likely opportunistic infection (less likely with no fever or cough) or HCAP or pulm edema  Known malignant Pleural Mesothelioma (right)  Recent Incidental Left PE (03/03/14) on anticoagulation with xarelto  P:  O2 for pusle ox > 88% Continue solumedrol 60mg  IV q6h through 12/20, then start to wean Empiric Abx (see ID) Hold off bronch now (risk v benefit issue) ; consider bronch depending on course Add incentive spirometry Out of bed   CARDIOVASCULAR CVL none A: Hx of  Hypertension Hypotensive at admit but normotensive and tolerating atenolol as of 04/18/14  Demand ischemia on 12/18, echo normal P:  Add ASA Check 12 lead Tele Continue heparin for PE, but doubt helping much with demand ischemia Supportive care for underlying illness   RENAL A:  BPH on proscar and hytrin P:  Continue bph meds   GASTROINTESTINAL A: Hx of diverticulosis Hemorrhoids P:  PPI Monitor for bleeding  ONCOLOGY A : Mesothelioma P monitor  HEMATOLOGIC A:  PE P:  PRBC per ICU guidelines Continue to monitor for bleeding Continue heparin gtt  INFECTIOUS A: ? HCAP, seems less likely  P:  Zosyn to continue through 12/20 Bronchoscopy if intubated  ENDOCRINE A: Risk of hyperglycemia on steroids P:  ssi  NEUROLOGIC A: Anxiety Cancer related pain P:  Add prn ativan Continue prn oxycodone  FAMILY  - Updates: wife updated 12/19   Roselie Awkward, MD [place] [facility] Pager: [phone] Cell: [phone] If no response, call [phone]

## 2014-04-19 NOTE — Progress Notes (Signed)
ANTICOAGULATION CONSULT NOTE -  Pharmacy Consult for heparin Indication: history of pulmonary embolism  Allergies  Allergen Reactions  . Other Swelling and Rash    Yellow Jackets    Patient Measurements: Height: 5\' 8"  (099.8 cm) Weight: 148 lb 2.4 oz (67.2 kg) IBW/kg (Calculated) : 68.4 Heparin Dosing Weight: 67kg  Vital Signs: Temp: 97.7 F (36.5 C) (12/19 0340) Temp Source: Oral (12/19 0340) BP: 102/47 mmHg (12/19 0000) Pulse Rate: 58 (12/19 0000)  Labs:  Recent Labs  04/14/2014 1121  04/17/14 0442 04/18/14 1209 04/18/14 1926 04/19/14 0245  HGB  --   < > 9.3* 9.5*  --  9.0*  HCT  --   --  28.7* 30.0*  --  27.8*  PLT  --   --  265 262  --  262  APTT  --   --   --  37  --  74*  LABPROT  --   --   --  23.1*  --  17.8*  INR  --   --   --  2.02*  --  1.45  HEPARINUNFRC  --   --   --  >2.20*  --  0.91*  CREATININE 1.5*  --  1.48*  --   --  1.50*  TROPONINI  --   --   --  0.57* 0.50* 1.23*  < > = values in this interval not displayed.  Estimated Creatinine Clearance: 38.6 mL/min (by C-G formula based on Cr of 1.5).   Medical History: Past Medical History  Diagnosis Date  . Personal history of contact with and (suspected) exposure to asbestos   . Unspecified essential hypertension   . Other and unspecified hyperlipidemia   . Esophageal reflux   . Diverticulosis of colon (without mention of hemorrhage)   . Elevated prostate specific antigen (PSA)   . Osteoarthrosis, unspecified whether generalized or localized, unspecified site   . Anxiety state, unspecified   . Rectal mass   . Shortness of breath     due to fluid  . Pulmonary emboli 03/10/2014  . Cancer     right lung  . Mesothelioma     Assessment: 71 yoM with PMH of mesothelioma (has just finish chemotherapy appro 4-5 weeks ago), admitted 12/16 with c/o progressive ShOB, dry cough. Pt being treated for bronchiolitis/pneumonia. Pt is still requiring high O2 requirements and is to transfer ICU/SD for a higher  level of care. Patient with recent diagnosis of pulmonary embolism on 03/03/14 seen on re-staging CT, started originally on enoxaparin treatment, transitioned to rivaroxaban on 11/9, with the plan to continue anticoagulation for at least 6 months. Pharmacy is consulted to dose heparin in place of rivaroxaban due to ICU transfer and ability to control anticoagulation status better per CCM MD.  CT chest 12/16 showed no evidence of acute pulmonary embolism   Last dose rivaroxaban 20mg  given 12/17 at [facility], will start heparin gtt 24h post- last dose of rivaroxaban  Hgb slightly below baseline at 9.3  Plts ok  CrCl 38 ml/min  Due to interaction of rivaroxaban with anti-Xa levels used to dose heparin, will check aPTTs with each anti-Xa level until aPTT and anti-Xa level correspond  Heparin level = 0.91 and aPTT = 74 sec with heparin infusing @ 1000 units/hr  No further bleeding noted since small about of rectal bleeding with a bowel movement (prior to initiation of heparin).   Goal of Therapy:  Heparin level 0.3-0.7 units/ml aPTT 66-102 seconds Monitor platelets by anticoagulation protocol: Yes  Plan:  - continue heparin gtt at 1000 units/hr (16 units/kg heparin dosing weight/hr)  - repeat 8 hour heparin level and aPTT - daily heparin level and aPTT until results correspond - daily CBC - monitor for bleeding - Pharmacy will follow-up daily  Thank you for the consult.  Leone Haven, PharmD  04/19/2014 4:47 AM

## 2014-04-19 NOTE — Progress Notes (Signed)
[place] for heparin Indication: history of pulmonary embolism  aPTT = 85 seconds Heparin level = 0.45  Both aPTT and heparin level therapeutic on 1150 units/hr No bleeding reported per RN  Goal of Therapy:  Heparin level 0.3-0.7 units/ml aPTT 66-102 seconds Monitor platelets by anticoagulation protocol: Yes  Plan:  Continue heparin 1150 units/hr  F/u APTT, heparin level and CBC in AM - can likely stop checking aPTT since rivaroxaban effects appear to have subsided  Peggyann Juba, PharmD, BCPS Pager: [phone] 04/19/2014 8:57 PM

## 2014-04-19 NOTE — Progress Notes (Signed)
Two EKG's completed on 04/19/14.  Two leads were incorrectly placed during the first EKG so the EKG was repeated.  Both EKGs in the paper chart.  At this time the second, normal, EKG has not uploaded to [facility].  Please see paper chart.

## 2014-04-20 ENCOUNTER — Inpatient Hospital Stay (HOSPITAL_COMMUNITY): Payer: Medicare Other

## 2014-04-20 LAB — BASIC METABOLIC PANEL
Anion gap: 12 (ref 5–15)
BUN: 44 mg/dL — ABNORMAL HIGH (ref 6–23)
CHLORIDE: 106 meq/L (ref 96–112)
CO2: 24 mEq/L (ref 19–32)
Calcium: 9.2 mg/dL (ref 8.4–10.5)
Creatinine, Ser: 1.76 mg/dL — ABNORMAL HIGH (ref 0.50–1.35)
GFR calc Af Amer: 41 mL/min — ABNORMAL LOW (ref >90)
GFR calc non Af Amer: 35 mL/min — ABNORMAL LOW (ref >90)
GLUCOSE: 158 mg/dL — AB (ref 70–99)
POTASSIUM: 3.7 meq/L (ref 3.7–5.3)
SODIUM: 142 meq/L (ref 137–147)

## 2014-04-20 LAB — GLUCOSE, CAPILLARY
GLUCOSE-CAPILLARY: 151 mg/dL — AB (ref 70–99)
GLUCOSE-CAPILLARY: 181 mg/dL — AB (ref 70–99)
GLUCOSE-CAPILLARY: 184 mg/dL — AB (ref 70–99)
Glucose-Capillary: 145 mg/dL — ABNORMAL HIGH (ref 70–99)
Glucose-Capillary: 127 mg/dL — ABNORMAL HIGH (ref 70–99)
Glucose-Capillary: 195 mg/dL — ABNORMAL HIGH (ref 70–99)
Glucose-Capillary: 121 mg/dL — ABNORMAL HIGH (ref 70–99)

## 2014-04-20 LAB — CBC WITH DIFFERENTIAL/PLATELET
Basophils Absolute: 0 10*3/uL (ref 0.0–0.1)
Basophils Relative: 0 % (ref 0–1)
Eosinophils Absolute: 0 10*3/uL (ref 0.0–0.7)
Eosinophils Relative: 0 % (ref 0–5)
HCT: 27.7 % — ABNORMAL LOW (ref 39.0–52.0)
HEMOGLOBIN: 8.9 g/dL — AB (ref 13.0–17.0)
LYMPHS ABS: 0.7 10*3/uL (ref 0.7–4.0)
Lymphocytes Relative: 6 % — ABNORMAL LOW (ref 12–46)
MCH: 29.8 pg (ref 26.0–34.0)
MCHC: 32.1 g/dL (ref 30.0–36.0)
MCV: 92.6 fL (ref 78.0–100.0)
MONOS PCT: 3 % (ref 3–12)
Monocytes Absolute: 0.3 10*3/uL (ref 0.1–1.0)
NEUTROS ABS: 9.2 10*3/uL — AB (ref 1.7–7.7)
NEUTROS PCT: 91 % — AB (ref 43–77)
PLATELETS: 217 10*3/uL (ref 150–400)
RBC: 2.99 MIL/uL — AB (ref 4.22–5.81)
RDW: 15.4 % (ref 11.5–15.5)
WBC: 10.2 10*3/uL (ref 4.0–10.5)

## 2014-04-20 LAB — PROCALCITONIN: Procalcitonin: <0.1 ng/mL

## 2014-04-20 LAB — APTT: APTT: 99 s — AB (ref 24–37)

## 2014-04-20 LAB — PHOSPHORUS: PHOSPHORUS: 4.5 mg/dL (ref 2.3–4.6)

## 2014-04-20 LAB — MAGNESIUM: MAGNESIUM: 2.1 mg/dL (ref 1.5–2.5)

## 2014-04-20 LAB — HEPARIN LEVEL (UNFRACTIONATED): HEPARIN UNFRACTIONATED: 0.5 [IU]/mL (ref 0.30–0.70)

## 2014-04-20 MED ORDER — SODIUM CHLORIDE 0.9 % IV SOLN: INTRAVENOUS | Status: DC

## 2014-04-20 MED ORDER — SODIUM CHLORIDE 0.9 % IV SOLN
INTRAVENOUS | Status: DC
  Administered 2014-04-20 – 2014-04-24 (×3): via INTRAVENOUS

## 2014-04-20 NOTE — Progress Notes (Signed)
Name: Sean Mitchell. MRN: [id number] DOB: 26-Oct-1933    ADMISSION DATE:  04/13/2014 CONSULTATION DATE:  12/17  REFERRING MD :  Charlies Silvers   CHIEF COMPLAINT/reason for consult:  Dyspnea and pulmonary infiltrates   BRIEF PATIENT DESCRIPTION:  This is a 80 year old male s/p 6 cycles of systemic chemo w/ Cisplatin and Alimta for right malignant pleural Mesothelioma. His last round of Chemo was 10/20. PCCM consulted regarding L >> R scattered ground glass infiltrates.   SIGNIFICANT EVENTS  12/16 admitted, rx'd w/ IV hydration and empirically covered w/ vanc/zosyn.  12/17: PCCM asked to see re: CT findings below.   STUDIES:  CT chest 12/16: 1. No evidence of acute pulmonary embolism.2. Mosaic profusion abnormality in the left upper lobe and left lower lobe. Question bronchiolitis with no other evidence of vascular etiology. 3. Thickened pleural rind throughout the right hemi thorax in this patient with pleural mesothelioma may be slightly more thickened possibly indicating some progression of the disease in the interval. Echo 12/18 > LVEF 60%, mild MR, RV normal   SUBJECTIVE:  Says he feels OK, doesn't feel very short of breath, has not been out of bed, he notes some anxiety, has not been working with incentive spirometry   VITAL SIGNS: Temp:  [96.4 F (35.8 C)-97.9 F (36.6 C)] 97.8 F (36.6 C) (12/20 0800) Pulse Rate:  [56-72] 65 (12/20 0600) Resp:  [15-27] 20 (12/20 0600) BP: (96-134)/(47-65) 119/50 mmHg (12/20 0400) SpO2:  [81 %-99 %] 98 % (12/20 0600) FiO2 (%):  [55 %] 55 % (12/20 0600) Weight:  [67.3 kg (148 lb 5.9 oz)] 67.3 kg (148 lb 5.9 oz) (12/20 0400)  PHYSICAL EXAMINATION:  Gen: breathing comfortably while awake HEENT: NCAT, EOMi PULM: Crackles L lung, diminished R lung CV: RRR, no mgr AB: BS+, soft, nontender Ext: warm, no edema Neuro: A&Ox4, maew   Recent Labs Lab 04/17/14 0442 04/19/14 0245 04/20/14 0248  NA 140 137 142  K 4.5 4.1 3.7  CL 104 102  106  CO2 23 21 24   BUN 26* 36* 44*  CREATININE 1.48* 1.50* 1.76*  GLUCOSE 121* 148* 158*    Recent Labs Lab 04/18/14 1209 04/19/14 0245 04/20/14 0248  HGB 9.5* 9.0* 8.9*  HCT 30.0* 27.8* 27.7*  WBC 8.8 11.4* 10.2  PLT 262 262 217   No results found. ASSESSMENT / PLAN:  PULMONARY OETT A: Acute Hypoxemic Respiratory Failure with subacute hx with L>R central GGO; lengthy ddx, but favor related to pemetrexed (see discussion previous notes) 12/20 > subjectively feels well, O2 requirement essentially unchanged Known malignant Pleural Mesothelioma (right) Recent Incidental Left PE (03/03/14) on anticoagulation with xarelto  P:  O2 for pusle ox > 88% Continue solumedrol 60mg  IV q6h again today, consider wean 12/21 if O2 improving D/C Empiric Abx (see ID) Hold off bronch now (risk v benefit issue); consider bronch depending on course Incentive spirometry Out of bed CXR today   CARDIOVASCULAR CVL none A: Hx of Hypertension Hypotensive at admit but normotensive and tolerating atenolol as of 04/18/14  Demand ischemia on 12/18, echo normal P:  Add ASA Check 12 lead Tele Continue heparin for PE Supportive care for underlying illness   RENAL A:  BPH on proscar and hytrin P:  Continue bph meds   GASTROINTESTINAL A: Hx of diverticulosis Hemorrhoids P:  PPI Monitor for bleeding  ONCOLOGY A : Mesothelioma P monitor  HEMATOLOGIC A:  PE P:  PRBC per ICU guidelines Continue to monitor for bleeding Continue  heparin gtt  INFECTIOUS A: Does not have HCAP (no cough, fever, procalcitonin negative)  P:  D/c zosynd Bronchoscopy if intubated  ENDOCRINE A: Risk of hyperglycemia on steroids P:  SSI  NEUROLOGIC A: Anxiety Cancer related pain P:  Continue prn ativan Continue prn oxycodone  FAMILY  - Updates: wife updated 12/20 bedside   Roselie Awkward, MD [place] [facility] Pager: [phone] Cell: [phone] If no response, call  [phone]

## 2014-04-20 NOTE — Progress Notes (Signed)
Sean Mitchell for heparin Indication: history of pulmonary embolism  Allergies  Allergen Reactions  . Other Swelling and Rash    Yellow Jackets    Patient Measurements: Height: 5\' 8"  (172.7 cm) Weight: 148 lb 5.9 oz (67.3 kg) IBW/kg (Calculated) : 68.4 Heparin Dosing Weight: 67kg  Vital Signs: Temp: 96.4 F (35.8 C) (12/20 0400) Temp Source: Axillary (12/20 0400) BP: 119/50 mmHg (12/20 0400) Pulse Rate: 65 (12/20 0600)  Labs:  Recent Labs  04/18/14 1209 04/18/14 1926 04/19/14 0245 04/19/14 1125 04/19/14 1144 04/19/14 1900 04/20/14 0248  HGB 9.5*  --  9.0*  --   --   --  8.9*  HCT 30.0*  --  27.8*  --   --   --  27.7*  PLT 262  --  262  --   --   --  217  APTT 37  --  74*  --  61* 85* 99*  LABPROT 23.1*  --  17.8*  --   --   --   --   INR 2.02*  --  1.45  --   --   --   --   HEPARINUNFRC >2.20*  --  0.91* 0.62  --  0.45 0.50  CREATININE  --   --  1.50*  --   --   --  1.76*  TROPONINI 0.57* 0.50* 1.23*  --   --   --   --     Estimated Creatinine Clearance: 32.9 mL/min (by C-G formula based on Cr of 1.76).   Assessment: 61 yoM with PMH of mesothelioma (has just finish chemotherapy appro 4-5 weeks ago), admitted 12/16 with c/o progressive ShOB, dry cough. Pt being treated for bronchiolitis/pneumonia. Pt is still requiring high O2 requirements and is to transfer ICU/SD for a higher level of care. Patient with recent diagnosis of pulmonary embolism on 03/03/14 seen on re-staging CT, started originally on enoxaparin treatment, transitioned to rivaroxaban on 11/9, with the plan to continue anticoagulation for at least 6 months. Pharmacy is consulted to dose heparin in place of rivaroxaban due to ICU transfer and ability to control anticoagulation status better per CCM MD. CT chest 12/16 showed no evidence of acute pulmonary embolism Last dose rivaroxaban 20mg  given 12/17 at [place], heparin drip was started 12/18 at 1815.  Due to interaction  of rivaroxaban with anti-Xa levels used to dose heparin, will check aPTTs with each anti-Xa level until aPTT and anti-Xa level correspond.  Today, 12/20:  APTT 99, therapeutic on heparin 1150 units/hr IV infusion.  Heparin level 0.5, therapeutic (rivaroxaban effects minimal)  Per RN report, no bleeding noted.  No complications or interruptions of heparin drip.  Hgb slightly low and decreased.  Pltc WNL.  SCr 1.76 increased, CrCl ~ 33 ml/min  Goal of Therapy:  Heparin level 0.3-0.7 units/ml Monitor platelets by anticoagulation protocol: Yes   Plan:  1.  Continue Heparin IV infusion at 1150 units/hr 2.  Continue daily heparin level and CBC.  D/C APTT levels 3.  Monitor for evidence of bleeding   Gretta Arab PharmD, BCPS Pager [phone] 04/20/2014 7:32 AM

## 2014-04-21 ENCOUNTER — Inpatient Hospital Stay (HOSPITAL_COMMUNITY): Payer: Medicare Other

## 2014-04-21 LAB — BASIC METABOLIC PANEL
Anion gap: 12 (ref 5–15)
BUN: 45 mg/dL — ABNORMAL HIGH (ref 6–23)
CO2: 24 mEq/L (ref 19–32)
Calcium: 8.6 mg/dL (ref 8.4–10.5)
Chloride: 107 mEq/L (ref 96–112)
Creatinine, Ser: 1.78 mg/dL — ABNORMAL HIGH (ref 0.50–1.35)
GFR calc Af Amer: 40 mL/min — ABNORMAL LOW (ref >90)
GFR calc non Af Amer: 35 mL/min — ABNORMAL LOW (ref >90)
GLUCOSE: 151 mg/dL — AB (ref 70–99)
Potassium: 4.3 mEq/L (ref 3.7–5.3)
Sodium: 143 mEq/L (ref 137–147)

## 2014-04-21 LAB — CBC WITH DIFFERENTIAL/PLATELET
Basophils Absolute: 0 10*3/uL (ref 0.0–0.1)
Basophils Relative: 0 % (ref 0–1)
EOS ABS: 0 10*3/uL (ref 0.0–0.7)
EOS PCT: 0 % (ref 0–5)
HCT: 27.3 % — ABNORMAL LOW (ref 39.0–52.0)
Hemoglobin: 8.7 g/dL — ABNORMAL LOW (ref 13.0–17.0)
Lymphocytes Relative: 8 % — ABNORMAL LOW (ref 12–46)
Lymphs Abs: 0.8 10*3/uL (ref 0.7–4.0)
MCH: 30 pg (ref 26.0–34.0)
MCHC: 31.9 g/dL (ref 30.0–36.0)
MCV: 94.1 fL (ref 78.0–100.0)
MONOS PCT: 5 % (ref 3–12)
Monocytes Absolute: 0.4 10*3/uL (ref 0.1–1.0)
Neutro Abs: 8.5 10*3/uL — ABNORMAL HIGH (ref 1.7–7.7)
Neutrophils Relative %: 87 % — ABNORMAL HIGH (ref 43–77)
PLATELETS: 253 10*3/uL (ref 150–400)
RBC: 2.9 MIL/uL — AB (ref 4.22–5.81)
RDW: 15.8 % — ABNORMAL HIGH (ref 11.5–15.5)
WBC: 9.7 10*3/uL (ref 4.0–10.5)

## 2014-04-21 LAB — GLUCOSE, CAPILLARY
GLUCOSE-CAPILLARY: 136 mg/dL — AB (ref 70–99)
GLUCOSE-CAPILLARY: 111 mg/dL — AB (ref 70–99)
GLUCOSE-CAPILLARY: 268 mg/dL — AB (ref 70–99)
GLUCOSE-CAPILLARY: 168 mg/dL — AB (ref 70–99)
Glucose-Capillary: 136 mg/dL — ABNORMAL HIGH (ref 70–99)

## 2014-04-21 LAB — HEPARIN LEVEL (UNFRACTIONATED): Heparin Unfractionated: 0.68 IU/mL (ref 0.30–0.70)

## 2014-04-21 LAB — PHOSPHORUS: PHOSPHORUS: 3.6 mg/dL (ref 2.3–4.6)

## 2014-04-21 LAB — MAGNESIUM: Magnesium: 1.8 mg/dL (ref 1.5–2.5)

## 2014-04-21 MED ORDER — OXYCODONE HCL 5 MG PO TABS
5.0000 mg | ORAL_TABLET | Freq: Four times a day (QID) | ORAL | Status: DC | PRN
  Administered 2014-04-21 – 2014-04-23 (×3): 5 mg via ORAL
  Filled 2014-04-21 (×3): qty 1

## 2014-04-21 MED ORDER — LORAZEPAM 0.5 MG PO TABS
0.5000 mg | ORAL_TABLET | Freq: Four times a day (QID) | ORAL | Status: DC | PRN
  Administered 2014-04-21 – 2014-04-26 (×11): 0.5 mg via ORAL
  Filled 2014-04-21 (×12): qty 1

## 2014-04-21 MED ORDER — INSULIN ASPART 100 UNIT/ML ~~LOC~~ SOLN
0.0000 [IU] | Freq: Three times a day (TID) | SUBCUTANEOUS | Status: DC
  Administered 2014-04-21 – 2014-04-22 (×2): 3 [IU] via SUBCUTANEOUS
  Administered 2014-04-22: 4 [IU] via SUBCUTANEOUS
  Administered 2014-04-22: 7 [IU] via SUBCUTANEOUS
  Administered 2014-04-23: 4 [IU] via SUBCUTANEOUS
  Administered 2014-04-24: 7 [IU] via SUBCUTANEOUS
  Administered 2014-04-25 – 2014-04-26 (×3): 3 [IU] via SUBCUTANEOUS

## 2014-04-21 NOTE — Progress Notes (Signed)
Wife removed wedding ring from patient and is taking it home with her.

## 2014-04-21 NOTE — Progress Notes (Signed)
Name: Sean Mitchell. MRN: [id number] DOB: [birth year]    ADMISSION DATE:  04/05/2014 CONSULTATION DATE:  12/17  REFERRING MD :  Charlies Silvers   CHIEF COMPLAINT/reason for consult:  Dyspnea and pulmonary infiltrates   BRIEF PATIENT DESCRIPTION:  80 year old male s/p 6 cycles of systemic chemo w/ Cisplatin and Alimta for right malignant pleural Mesothelioma. His last round of Chemo was 10/20. PCCM consulted regarding L >> R scattered ground glass infiltrates.   SIGNIFICANT EVENTS  12/16  Admitted, rx'd w/ IV hydration and empirically covered w/ vanc/zosyn 12/17  PCCM asked to see re: CT findings below  STUDIES:  12/16  CT Chest >> No evidence of acute pulmonary embolism. Mosaic profusion abnormality in the left upper lobe and left lower lobe. Question bronchiolitis with no other evidence of vascular etiology. Thickened pleural rind throughout the right hemi thorax in this patient with pleural mesothelioma may be slightly more thickened possibly indicating some progression of the disease in the interval. 12/18  ECHO >>  LVEF 60%, mild MR, RV normal   SUBJECTIVE:  Sleepy from ativan overnight.  Wife concerned about trajectory of illness and minimal response from steroids.    VITAL SIGNS: Temp:  [97 F (36.1 C)-98.1 F (36.7 C)] 97.7 F (36.5 C) (12/21 0741) Pulse Rate:  [63-76] 71 (12/21 0800) Resp:  [17-30] 20 (12/21 0800) BP: (112-163)/(57-79) 147/75 mmHg (12/21 0800) SpO2:  [88 %-100 %] 95 % (12/21 0800) Weight:  [147 lb 4.3 oz (66.8 kg)] 147 lb 4.3 oz (66.8 kg) (12/21 0500)  PHYSICAL EXAMINATION:  Gen: breathing comfortably while awake HEENT: NCAT, EOMi PULM: Crackles L lung, diminished R lung CV: RRR, no mgr AB: BS+, soft, nontender Ext: warm, no edema Neuro: A&Ox4, maew   Recent Labs Lab 04/19/14 0245 04/20/14 0248 04/21/14 0530  NA 137 142 143  K 4.1 3.7 4.3  CL 102 106 107  CO2 21 24 24   BUN 36* 44* 45*  CREATININE 1.50* 1.76* 1.78*  GLUCOSE 148* 158* 151*      Recent Labs Lab 04/19/14 0245 04/20/14 0248 04/21/14 0530  HGB 9.0* 8.9* 8.7*  HCT 27.8* 27.7* 27.3*  WBC 11.4* 10.2 9.7  PLT 262 217 253   Dg Chest Port 1 View  04/20/2014   CLINICAL DATA:  Cough, history of chemotherapy for mesothelioma.  EXAM: PORTABLE CHEST - 1 VIEW  COMPARISON:  04/18/2014, 09/30/2013, 03/03/2014 CT chest  FINDINGS: Right-sided PICC line with the tip projecting over the cavoatrial junction.  Diffuse bilateral interstitial thickening which has increased compared with 09/30/2013. There is no focal consolidation. There is a linear band of airspace disease in the right midlung. There is a loculated small right pleural effusion. There is no pneumothorax. The heart and mediastinum are stable. No acute osseous abnormality.  IMPRESSION: 1. Bilateral diffuse interstitial thickening which has increased compared with 09/30/2013, but unchanged compared with 04/18/2014. Differential considerations include interstitial edema versus hypersensitivity reaction including drug reaction versus atypical infection.   Electronically Signed   By: Kathreen Devoid   On: 04/20/2014 10:38   ASSESSMENT / PLAN:  PULMONARY OETT n/a A: Acute Hypoxemic Respiratory Failure with subacute hx with L>R central GGO; lengthy ddx, but favor related to pemetrexed (see discussion previous notes).  Requiring 5L O2 12/21 Known malignant Pleural Mesothelioma (right)  Recent Incidental Left PE (03/03/14) on anticoagulation with xarelto P:  O2 for pusle ox > 88% Continue solumedrol 60mg  IV q6h again today, consider wean 12/21 if O2 improving Monitor off  abx Hold off bronch now (risk vs benefit issue); consider bronch depending on course Incentive spirometry Out of bed, mobilize as tolerated with assistance F/u CXR in am  Pulmicort + Duoneb PRN  CARDIOVASCULAR CVL none A: Hx of Hypertension Hypotension - at admit but normotensive and tolerating atenolol as of 04/18/14  Demand ischemia on 12/18, echo  normal P:  ASA Tele Continue heparin for PE, consider  transition back to [place] soon Supportive care for underlying illness  RENAL A:  BPH on proscar and hytrin AKI P:  Continue BPH meds Trend BMP Avoid nephrotoxic agents as able   GASTROINTESTINAL A:  Hx of diverticulosis Hemorrhoids P:  PPI Monitor for bleeding  ONCOLOGY A :  Mesothelioma P: Monitor, no acute interventions  HEMATOLOGIC A:  PE P:  PRBC per ICU guidelines Continue to monitor for bleeding Continue heparin gtt  INFECTIOUS A:  Does not have HCAP (no cough, fever, procalcitonin negative)  P:  Monitor off abx Bronchoscopy if intubated  ENDOCRINE A:  Risk of hyperglycemia on steroids P:  SSI  NEUROLOGIC A:  Anxiety Cancer related pain P:  Continue prn ativan, reduce dosing as pt sedate 12/21 am  Continue prn oxycodone  FAMILY  - Updates: wife updated 12/21 bedside   Noe Gens, NP-C [facility] Pgr: [phone] or [phone]   Attending:  I have seen and examined the patient with nurse practitioner/resident and agree with the note above.   On exam today his oxygen level is better, his lung exam has not changed but he is a little more sleepy.  His drowsiness is likely due to ativan.  My differential diagnosis remains unchanged (see above).  I don't think this is infectious.  I explained to his wife and son today (at length) that a biopsy would be the only way to know what we are dealing with, but the risks of that are too high.  We will plan to treat with steroids (likely change to prednisone tomorrow) for weeks.  I explained that even though he seems to have improved some, the prognosis is uncertain, but we will try to get him home.    Roselie Awkward, MD [place] [facility] Pager: [phone] Cell: [phone] If no response, call [phone]

## 2014-04-21 NOTE — Progress Notes (Signed)
Physical Therapy Treatment Patient Details Name: Sean Mitchell. MRN: [id number] DOB: February 01, 1934 Today's Date: 04/21/2014    History of Present Illness Nicola Quesnell. is a 80 y.o. male with PMH of mesothelioma (has just finish chemotherapy appro 4-5 weeks ago), HTN, BPH, GERD, PE; who presented to ED complaining of SOB.     PT Comments    Pt pleasant and motivated and progressing to ambulation short distance in hall.  Pt ltd by desat and requiring multiple standing breaks to maintain O2 sats in high 80% range.  Pt did experience desat to 78% prior to return to chair.  Follow Up Recommendations  Home health PT     Equipment Recommendations  None recommended by PT    Recommendations for Other Services       Precautions / Restrictions Precautions Precautions: Fall Restrictions Weight Bearing Restrictions: No    Mobility  Bed Mobility               General bed mobility comments: OOB with RN  Transfers Overall transfer level: Needs assistance Equipment used: Rolling walker (2 wheeled) Transfers: Sit to/from Stand Sit to Stand: Min assist         General transfer comment: cues for use of UEs, min assist to steady with stand from low chair  Ambulation/Gait Ambulation/Gait assistance: Min assist;+2 safety/equipment Ambulation Distance (Feet): 22 Feet (and 18) Assistive device: Rolling walker (2 wheeled) Gait Pattern/deviations: Step-through pattern;Decreased step length - right;Decreased step length - left;Shuffle;Trunk flexed Gait velocity: decreased with multiple standing rests to maintain O2 level   General Gait Details: cues for posture, pace and position from RW.   Stairs            Wheelchair Mobility    Modified Rankin (Stroke Patients Only)       Balance                                    Cognition Arousal/Alertness: Awake/alert Behavior During Therapy: WFL for tasks assessed/performed Overall Cognitive Status:  Within Functional Limits for tasks assessed                      Exercises General Exercises - Lower Extremity Ankle Circles/Pumps: AROM;Both;15 reps;Supine Long Arc Quad: AROM;Both;10 reps;Seated Hip Flexion/Marching: AROM;Both;10 reps;Seated    General Comments        Pertinent Vitals/Pain Pain Assessment: No/denies pain    Home Living                      Prior Function            PT Goals (current goals can now be found in the care plan section) Acute Rehab PT Goals Patient Stated Goal: Walk without assist PT Goal Formulation: With patient/family Time For Goal Achievement: 05/01/14 Potential to Achieve Goals: Good Progress towards PT goals: Progressing toward goals    Frequency  Min 3X/week    PT Plan Current plan remains appropriate    Co-evaluation             End of Session Equipment Utilized During Treatment: Oxygen Activity Tolerance: Treatment limited secondary to medical complications (Comment);Other (comment) (desat) Patient left: in chair;with call bell/phone within reach;with family/visitor present     Time: 3235-5732 PT Time Calculation (min) (ACUTE ONLY): 32 min  Charges:  $Gait Training: 23-37 mins  G Codes:      Filiberto Wamble 04-26-14, 11:29 AM

## 2014-04-21 NOTE — Progress Notes (Signed)
[place] for heparin Indication: history of pulmonary embolism  Allergies  Allergen Reactions  . Other Swelling and Rash    Yellow Jackets    Patient Measurements: Height: 5\' 8"  (172.7 cm) Weight: 147 lb 4.3 oz (66.8 kg) IBW/kg (Calculated) : 68.4 Heparin Dosing Weight: 67kg  Vital Signs: Temp: 97.7 F (36.5 C) (12/21 0741) Temp Source: Oral (12/21 0741) BP: 147/75 mmHg (12/21 0800) Pulse Rate: 71 (12/21 0800)  Labs:  Recent Labs  04/18/14 1209 04/18/14 1926 04/19/14 0245  04/19/14 1144 04/19/14 1900 04/20/14 0248 04/21/14 0530  HGB 9.5*  --  9.0*  --   --   --  8.9* 8.7*  HCT 30.0*  --  27.8*  --   --   --  27.7* 27.3*  PLT 262  --  262  --   --   --  217 253  APTT 37  --  74*  --  61* 85* 99*  --   LABPROT 23.1*  --  17.8*  --   --   --   --   --   INR 2.02*  --  1.45  --   --   --   --   --   HEPARINUNFRC >2.20*  --  0.91*  < >  --  0.45 0.50 0.68  CREATININE  --   --  1.50*  --   --   --  1.76* 1.78*  TROPONINI 0.57* 0.50* 1.23*  --   --   --   --   --   < > = values in this interval not displayed.  Estimated Creatinine Clearance: 32.3 mL/min (by C-G formula based on Cr of 1.78).   Assessment: 59 yoM with PMH of mesothelioma (has just finish chemotherapy appro 4-5 weeks ago), admitted 12/16 with c/o progressive ShOB, dry cough. Pt being treated for bronchiolitis/pneumonia. Pt is still requiring high O2 requirements and transferred to ICU/SD.  Recent PE on 03/03/14 seen on re-staging CT, on rivaroxaban at admission with the plan to continue anticoagulation for at least 6 months.  Pharmacy is consulted to dose heparin in place of rivaroxaban due to ICU transfer and ability to control anticoagulation status better per CCM MD.  CT chest 12/16 showed no evidence of acute pulmonary embolism Last dose rivaroxaban 20mg  given 12/17 at [facility], heparin drip was started 12/18 at 1815.  Due to interaction of rivaroxaban with anti-Xa levels  used to dose heparin, will check aPTTs with each anti-Xa level until aPTT and anti-Xa level correspond.  Today, 12/20:  Heparin level 0.68, therapeutic on 1150 units/hr.  Residual effect of rivaroxaban appears to be gone, and heparin level now trending up in response to UFH.  Per RN report, no bleeding noted.  No complications or interruptions of heparin drip.  Hgb moderately low and decreased (I/O net negative).  Pltc WNL.  SCr 1.78 increased, CrCl ~ 32 ml/min  Goal of Therapy:  Heparin level 0.3-0.7 units/ml Monitor platelets by anticoagulation protocol: Yes   Plan:   Given upward trending Heparin level, now just below upper limit of therapeutic range, will slow infusion to 1100 units/hr to avoid becoming supratherapeutic.  Rate was increased to 1150 based on a low aPTT which is less sensitive to UFH than an anti Xa level.  Continue daily heparin level and CBC.  Monitor for evidence of bleeding  F/u for appropriate transition back to rivaroxaban    Reuel Boom, PharmD Pager: [phone] 04/21/2014, 10:25 AM

## 2014-04-21 NOTE — Progress Notes (Signed)
Noe Gens NP notifed of CBG 268. Sliding scale insulin changed.Phase 2 of hyperglycemic protocol not started at this time.

## 2014-04-22 ENCOUNTER — Encounter (HOSPITAL_COMMUNITY): Payer: Self-pay | Admitting: *Deleted

## 2014-04-22 LAB — CBC WITH DIFFERENTIAL/PLATELET
BASOS PCT: 0 % (ref 0–1)
Basophils Absolute: 0 10*3/uL (ref 0.0–0.1)
EOS PCT: 0 % (ref 0–5)
Eosinophils Absolute: 0 10*3/uL (ref 0.0–0.7)
HEMATOCRIT: 26.7 % — AB (ref 39.0–52.0)
Hemoglobin: 8.6 g/dL — ABNORMAL LOW (ref 13.0–17.0)
Lymphocytes Relative: 11 % — ABNORMAL LOW (ref 12–46)
Lymphs Abs: 0.9 10*3/uL (ref 0.7–4.0)
MCH: 30.3 pg (ref 26.0–34.0)
MCHC: 32.2 g/dL (ref 30.0–36.0)
MCV: 94 fL (ref 78.0–100.0)
MONO ABS: 0.3 10*3/uL (ref 0.1–1.0)
Monocytes Relative: 4 % (ref 3–12)
Neutro Abs: 7.1 10*3/uL (ref 1.7–7.7)
Neutrophils Relative %: 85 % — ABNORMAL HIGH (ref 43–77)
Platelets: 243 10*3/uL (ref 150–400)
RBC: 2.84 MIL/uL — ABNORMAL LOW (ref 4.22–5.81)
RDW: 16 % — ABNORMAL HIGH (ref 11.5–15.5)
WBC: 8.3 10*3/uL (ref 4.0–10.5)

## 2014-04-22 LAB — MAGNESIUM: Magnesium: 2.1 mg/dL (ref 1.5–2.5)

## 2014-04-22 LAB — BASIC METABOLIC PANEL
ANION GAP: 7 (ref 5–15)
BUN: 47 mg/dL — ABNORMAL HIGH (ref 6–23)
CALCIUM: 8.7 mg/dL (ref 8.4–10.5)
CO2: 27 mmol/L (ref 19–32)
CREATININE: 1.45 mg/dL — AB (ref 0.50–1.35)
Chloride: 108 mEq/L (ref 96–112)
GFR calc non Af Amer: 45 mL/min — ABNORMAL LOW (ref >90)
GFR, EST AFRICAN AMERICAN: 52 mL/min — AB (ref >90)
Glucose, Bld: 146 mg/dL — ABNORMAL HIGH (ref 70–99)
Potassium: 4 mmol/L (ref 3.5–5.1)
Sodium: 142 mmol/L (ref 135–145)

## 2014-04-22 LAB — HEPARIN LEVEL (UNFRACTIONATED): Heparin Unfractionated: 0.58 IU/mL (ref 0.30–0.70)

## 2014-04-22 LAB — GLUCOSE, CAPILLARY
GLUCOSE-CAPILLARY: 177 mg/dL — AB (ref 70–99)
Glucose-Capillary: 147 mg/dL — ABNORMAL HIGH (ref 70–99)
Glucose-Capillary: 232 mg/dL — ABNORMAL HIGH (ref 70–99)
Glucose-Capillary: 133 mg/dL — ABNORMAL HIGH (ref 70–99)

## 2014-04-22 LAB — PHOSPHORUS: Phosphorus: 3.7 mg/dL (ref 2.3–4.6)

## 2014-04-22 MED ORDER — RIVAROXABAN 20 MG PO TABS
20.0000 mg | ORAL_TABLET | Freq: Every day | ORAL | Status: DC
  Administered 2014-04-23: 20 mg via ORAL
  Filled 2014-04-22 (×4): qty 1

## 2014-04-22 MED ORDER — PREDNISONE 20 MG PO TABS
60.0000 mg | ORAL_TABLET | Freq: Every day | ORAL | Status: DC
  Administered 2014-04-22 – 2014-04-23 (×2): 60 mg via ORAL
  Filled 2014-04-22 (×2): qty 3

## 2014-04-22 MED ORDER — ATENOLOL 25 MG PO TABS
25.0000 mg | ORAL_TABLET | Freq: Every day | ORAL | Status: DC
  Administered 2014-04-22 – 2014-04-26 (×4): 25 mg via ORAL
  Filled 2014-04-22 (×4): qty 1

## 2014-04-22 MED ORDER — FUROSEMIDE 10 MG/ML IJ SOLN
40.0000 mg | Freq: Once | INTRAMUSCULAR | Status: AC
  Administered 2014-04-22: 40 mg via INTRAVENOUS
  Filled 2014-04-22: qty 4

## 2014-04-22 NOTE — Progress Notes (Signed)
Name: Sean Mitchell. MRN: [id number] DOB: 12/16/1933    ADMISSION DATE:  04/22/2014 CONSULTATION DATE:  12/17  REFERRING MD :  Charlies Silvers   CHIEF COMPLAINT/reason for consult:  Dyspnea and pulmonary infiltrates   BRIEF PATIENT DESCRIPTION:  80 year old male s/p 6 cycles of systemic chemo w/ Cisplatin and Alimta for right malignant pleural Mesothelioma. His last round of Chemo was 10/20. PCCM consulted regarding L >> R scattered ground glass infiltrates.   SIGNIFICANT EVENTS  12/16  Admitted, rx'd w/ IV hydration and empirically covered w/ vanc/zosyn 12/17  PCCM asked to see re: CT findings below  STUDIES:  12/16  CT Chest >> No evidence of acute pulmonary embolism. Mosaic profusion abnormality in the left upper lobe and left lower lobe. Question bronchiolitis with no other evidence of vascular etiology. Thickened pleural rind throughout the right hemi thorax in this patient with pleural mesothelioma may be slightly more thickened possibly indicating some progression of the disease in the interval. 12/18  ECHO >>  LVEF 60%, mild MR, RV normal   SUBJECTIVE:  Dyspnea about the same, worked with PT, swelling in hands; BP dropped yesterday with ambulation    VITAL SIGNS: Temp:  [97.5 F (36.4 C)-98.1 F (36.7 C)] 97.8 F (36.6 C) (12/22 0400) Pulse Rate:  [69-87] 69 (12/22 0400) Resp:  [15-33] 17 (12/22 0400) BP: (134-161)/(72-79) 148/73 mmHg (12/22 0400) SpO2:  [68 %-99 %] 99 % (12/22 0400) Weight:  [148 lb 2.4 oz (67.2 kg)] 148 lb 2.4 oz (67.2 kg) (12/22 0500)  PHYSICAL EXAMINATION:  Gen: breathing comfortably while awake HEENT: NCAT, EOMi PULM: Crackles L lung, diminished R lung CV: RRR, no mgr AB: BS+, soft, nontender Ext: warm, notable hand and ankle edema Neuro: A&Ox4, maew   Recent Labs Lab 04/20/14 0248 04/21/14 0530 04/22/14 0520  NA 142 143 142  K 3.7 4.3 4.0  CL 106 107 108  CO2 24 24 27   BUN 44* 45* 47*  CREATININE 1.76* 1.78* 1.45*  GLUCOSE 158*  151* 146*    Recent Labs Lab 04/20/14 0248 04/21/14 0530 04/22/14 0520  HGB 8.9* 8.7* 8.6*  HCT 27.7* 27.3* 26.7*  WBC 10.2 9.7 8.3  PLT 217 253 243   Dg Chest Port 1 View  04/21/2014   CLINICAL DATA:  Re-evaluate pleural effusions; cough and shortness of breath ; history of lung malignancy.  EXAM: PORTABLE CHEST - 1 VIEW  COMPARISON:  Portable chest x-ray of April 20, 2014  FINDINGS: The lungs are mildly hypoinflated. There are persistently increased interstitial markings bilaterally. A small amount of pleural fluid on the right is present blunting the costophrenic angle and lying in the minor fissure. The cardiac silhouette is top-normal in size. The pulmonary vascularity is not engorged. The mediastinum is normal in width. A PICC line is in place whose tip lies just proximal to the cavoatrial junction. The bony thorax is unremarkable.  IMPRESSION: Persistently increased pulmonary interstitial markings without significant change from the previous study. There is persistent pleural thickening along the right lateral thoracic wall. A small amount of pleural fluid may be present as well.   Electronically Signed   By: David  Martinique   On: 04/21/2014 09:58   Dg Chest Port 1 View  04/20/2014   CLINICAL DATA:  Cough, history of chemotherapy for mesothelioma.  EXAM: PORTABLE CHEST - 1 VIEW  COMPARISON:  04/18/2014, 09/30/2013, 03/03/2014 CT chest  FINDINGS: Right-sided PICC line with the tip projecting over the cavoatrial junction.  Diffuse  bilateral interstitial thickening which has increased compared with 09/30/2013. There is no focal consolidation. There is a linear band of airspace disease in the right midlung. There is a loculated small right pleural effusion. There is no pneumothorax. The heart and mediastinum are stable. No acute osseous abnormality.  IMPRESSION: 1. Bilateral diffuse interstitial thickening which has increased compared with 09/30/2013, but unchanged compared with 04/18/2014.  Differential considerations include interstitial edema versus hypersensitivity reaction including drug reaction versus atypical infection.   Electronically Signed   By: Kathreen Devoid   On: 04/20/2014 10:38   ASSESSMENT / PLAN:  PULMONARY OETT n/a A: Acute Hypoxemic Respiratory Failure felt to be related to Pemetrexed (see previous notes); given slow progress I fear there is a fibrotic process here (NSIP reported in addition to multiple other pulmonary toxicity syndromes)  > 12/22 > pulm edema?  Recent Incidental Left PE (03/03/14) on anticoagulation with xarelto, repeat CT 04/2014 without PE P:  O2 for pusle ox > 88% OK to ambulate, work with PT (strongly encouraged, supplement O2 as needed) Change solumedrol to prednisone 60mg  12/22 Incentive spirometry Lasix x1 12/22 Pulmicort + Duoneb PRN Plan is to continue steroids for pneumonitis, hopefully transition to home if O2 improves (see below)  CARDIOVASCULAR CVL none A: Hypertension Demand ischemia on 12/18, echo normal; no evidence of CHF P:  ASA Tele Switch back to Xarelto 12/22  RENAL A:  BPH on proscar and hytrin AKI P:  Continue BPH meds Trend BMP Avoid nephrotoxic agents as able   GASTROINTESTINAL A:  Hx of diverticulosis Hemorrhoids P:  PPI Monitor for bleeding  ONCOLOGY A :  Mesothelioma P: Monitor, no acute interventions  HEMATOLOGIC A:  PE P:  PRBC per ICU guidelines Continue to monitor for bleeding Continue heparin gtt  INFECTIOUS A:  Does not have HCAP (no cough, fever, procalcitonin negative)  P:  Monitor off abx Bronchoscopy if intubated  ENDOCRINE A:  Risk of hyperglycemia on steroids P:  SSI  NEUROLOGIC A:  Anxiety Cancer related pain P:  Continue prn ativan, reduce dosing as pt sedate 12/21 am  Continue prn oxycodone  FAMILY  - Updates: wife and son updated 12/21 bedside at length, wife updated on round 12/22  GLOBAL: Slow progress, try lasix,  transition to prednisone today as progress with hypoxemia vs side effects of high dose solumedrol favors a lower dose of steroid.  If no improvement by next week may need to consider hospice.  Still hoping for a response to steroids.  Roselie Awkward, MD [place] [facility] Pager: [phone] Cell: [phone] If no response, call [phone]

## 2014-04-22 NOTE — Care Management Note (Unsigned)
    Page 1 of 2   04/22/2014     4:06:49 PM CARE MANAGEMENT NOTE 04/22/2014  Patient:  AZARIEL, BANIK   Account Number:  [id number]  Date Initiated:  04/22/2014  Documentation initiated by:  [facility]  Subjective/Objective Assessment:   adm: SOB     Action/Plan:   discharge planning   Anticipated DC Date:  04/25/2014   Anticipated DC Plan:  [place]  CM consult      [facility] Choice  HOME HEALTH   Choice offered to / List presented to:  C-1 Patient   DME arranged  [facility]      DME agency  [place] arranged  [place]      [place].   Status of service:  In process, will continue to follow Medicare Important Message given?   (If response is "NO", the following Medicare IM given date fields will be blank) Date Medicare IM given:   Medicare IM given by:   Date Additional Medicare IM given:   Additional Medicare IM given by:    Discharge Disposition:  [place]  Per UR Regulation:    If discussed at Long Length of Stay Meetings, dates discussed:    Comments:  04/22/14 10:30 CM met with pt in room to offer choice of home health agency.  Pt chooses [facility] to render HHPT/RN. Pt's wife is an Therapist, sports.  Address and contact information verified with pt. Pt will most probably need home oxygen. Referal called to [facility] rep, Stephanie.  Request for orders placed for home oxygen, HHPT/RN.  Will continue to monitor for disposition.  Mariane Masters, BSN, CM [phone].

## 2014-04-22 NOTE — Progress Notes (Signed)
Physical Therapy Treatment Patient Details Name: Sean Mitchell. MRN: [id number] DOB: 13-Feb-1934 Today's Date: 04/22/2014    History of Present Illness Adham Johnson. is a 80 y.o. male with PMH of mesothelioma (has just finish chemotherapy appro 4-5 weeks ago), HTN, BPH, GERD, PE; who presented to ED complaining of SOB.     PT Comments    Pt progressing with ambulation - distance increasing but continues to require multiple rests to complete task 2* SOB and O2 desat.  Follow Up Recommendations  Home health PT     Equipment Recommendations  None recommended by PT    Recommendations for Other Services       Precautions / Restrictions Precautions Precautions: Fall Restrictions Weight Bearing Restrictions: No    Mobility  Bed Mobility Overal bed mobility: Needs Assistance Bed Mobility: Rolling;Sidelying to Sit Rolling: Modified independent (Device/Increase time) Sidelying to sit: Min guard       General bed mobility comments: cues for sequence  Transfers Overall transfer level: Needs assistance Equipment used: Rolling walker (2 wheeled) Transfers: Sit to/from Stand Sit to Stand: Min assist         General transfer comment: cues for use of UEs, min assist to steady with stand from low chair  Ambulation/Gait Ambulation/Gait assistance: Min assist Ambulation Distance (Feet): 58 Feet (and 42) Assistive device: Rolling walker (2 wheeled) Gait Pattern/deviations: Step-through pattern;Decreased step length - right;Decreased step length - left;Shuffle;Trunk flexed Gait velocity: decreased with multiple standing rests to maintain O2 level   General Gait Details: cues for posture, pace and position from RW.   Stairs            Wheelchair Mobility    Modified Rankin (Stroke Patients Only)       Balance                                    Cognition Arousal/Alertness: Awake/alert Behavior During Therapy: WFL for tasks  assessed/performed Overall Cognitive Status: Within Functional Limits for tasks assessed                      Exercises General Exercises - Lower Extremity Ankle Circles/Pumps: AROM;Both;15 reps;Supine Heel Slides: AROM;10 reps;Supine;Both Hip ABduction/ADduction: AROM;Both;10 reps;Supine    General Comments        Pertinent Vitals/Pain Pain Assessment: No/denies pain    Home Living                      Prior Function            PT Goals (current goals can now be found in the care plan section) Acute Rehab PT Goals Patient Stated Goal: Walk without assist PT Goal Formulation: With patient/family Time For Goal Achievement: 05/01/14 Potential to Achieve Goals: Good Progress towards PT goals: Progressing toward goals    Frequency  Min 3X/week    PT Plan Current plan remains appropriate    Co-evaluation             End of Session Equipment Utilized During Treatment: Oxygen Activity Tolerance: Treatment limited secondary to medical complications (Comment);Other (comment) Patient left: in chair;with call bell/phone within reach;with family/visitor present     Time: 1002-1040 PT Time Calculation (min) (ACUTE ONLY): 38 min  Charges:  $Gait Training: 23-37 mins $Therapeutic Exercise: 8-22 mins  G Codes:      Vernell Townley 05/08/14, 12:49 PM

## 2014-04-23 LAB — URINALYSIS, ROUTINE W REFLEX MICROSCOPIC
Bilirubin Urine: NEGATIVE
Glucose, UA: NEGATIVE mg/dL
KETONES UR: NEGATIVE mg/dL
LEUKOCYTES UA: NEGATIVE
Nitrite: NEGATIVE
PH: 5 (ref 5.0–8.0)
Protein, ur: >300 mg/dL — AB
SPECIFIC GRAVITY, URINE: 1.02 (ref 1.005–1.030)
Urobilinogen, UA: 0.2 mg/dL (ref 0.0–1.0)

## 2014-04-23 LAB — CBC WITH DIFFERENTIAL/PLATELET
Basophils Absolute: 0 10*3/uL (ref 0.0–0.1)
Basophils Relative: 0 % (ref 0–1)
Eosinophils Absolute: 0 10*3/uL (ref 0.0–0.7)
Eosinophils Relative: 0 % (ref 0–5)
HCT: 29.4 % — ABNORMAL LOW (ref 39.0–52.0)
Hemoglobin: 9.4 g/dL — ABNORMAL LOW (ref 13.0–17.0)
LYMPHS ABS: 1.4 10*3/uL (ref 0.7–4.0)
Lymphocytes Relative: 12 % (ref 12–46)
MCH: 30.1 pg (ref 26.0–34.0)
MCHC: 32 g/dL (ref 30.0–36.0)
MCV: 94.2 fL (ref 78.0–100.0)
Monocytes Absolute: 0.9 10*3/uL (ref 0.1–1.0)
Monocytes Relative: 7 % (ref 3–12)
NEUTROS PCT: 81 % — AB (ref 43–77)
Neutro Abs: 9.6 10*3/uL — ABNORMAL HIGH (ref 1.7–7.7)
PLATELETS: 222 10*3/uL (ref 150–400)
RBC: 3.12 MIL/uL — AB (ref 4.22–5.81)
RDW: 16.6 % — ABNORMAL HIGH (ref 11.5–15.5)
WBC: 11.9 10*3/uL — AB (ref 4.0–10.5)

## 2014-04-23 LAB — GLUCOSE, CAPILLARY
GLUCOSE-CAPILLARY: 109 mg/dL — AB (ref 70–99)
Glucose-Capillary: 91 mg/dL (ref 70–99)
Glucose-Capillary: 187 mg/dL — ABNORMAL HIGH (ref 70–99)

## 2014-04-23 LAB — CULTURE, BLOOD (ROUTINE X 2)
Culture: NO GROWTH
Culture: NO GROWTH

## 2014-04-23 LAB — URINE MICROSCOPIC-ADD ON

## 2014-04-23 MED ORDER — ALTEPLASE 2 MG IJ SOLR
2.0000 mg | Freq: Once | INTRAMUSCULAR | Status: AC
  Administered 2014-04-23: 2 mg
  Filled 2014-04-23: qty 2

## 2014-04-23 MED ORDER — OXYMETAZOLINE HCL 0.05 % NA SOLN
1.0000 | Freq: Two times a day (BID) | NASAL | Status: DC | PRN
  Administered 2014-04-23 – 2014-04-26 (×2): 1 via NASAL
  Filled 2014-04-23: qty 15

## 2014-04-23 NOTE — Progress Notes (Signed)
Name: Sean Mitchell. MRN: [id number] DOB: July 22, 1933    ADMISSION DATE:  04/04/2014 CONSULTATION DATE:  12/17  REFERRING MD :  Charlies Silvers   CHIEF COMPLAINT/reason for consult:  Dyspnea and pulmonary infiltrates   BRIEF PATIENT DESCRIPTION:  80 year old male s/p 6 cycles of systemic chemo w/ Cisplatin and Alimta for right malignant pleural Mesothelioma. His last round of Chemo was 10/20. PCCM consulted regarding L >> R scattered ground glass infiltrates.   SIGNIFICANT EVENTS  12/16  Admitted, rx'd w/ IV hydration and empirically covered w/ vanc/zosyn 12/17  PCCM asked to see re: CT findings below 12/21  Dyspnea about the same, worked with PT, swelling in hands; BP dropped yesterday with ambulation   STUDIES:  12/16  CT Chest >> No evidence of acute pulmonary embolism. Mosaic profusion abnormality in the left upper lobe and left lower lobe. Question bronchiolitis with no other evidence of vascular etiology. Thickened pleural rind throughout the right hemi thorax in this patient with pleural mesothelioma may be slightly more thickened possibly indicating some progression of the disease in the interval. 12/18  ECHO >>  LVEF 60%, mild MR, RV normal   SUBJECTIVE:  RN reports pt very unsteady on feet using urinal.  Pt indicates he is weaker than yesterday, gets dizzy / lightheaded with getting up.    VITAL SIGNS: Temp:  [97.6 F (36.4 C)-98.1 F (36.7 C)] 98.1 F (36.7 C) (12/23 0400) Pulse Rate:  [58-66] 65 (12/23 0800) Resp:  [20-24] 23 (12/23 0800) BP: (123-161)/(60-76) 161/75 mmHg (12/23 0800) SpO2:  [89 %-95 %] 91 % (12/23 0800) Weight:  [149 lb 0.5 oz (67.6 kg)] 149 lb 0.5 oz (67.6 kg) (12/23 0400)  PHYSICAL EXAMINATION:  Gen: breathing comfortably at rest  HEENT: NCAT, EOMi PULM: Crackles L lung, diminished R lung CV: RRR, no mgr AB: BS+, soft, nontender Ext: warm, improved hand and ankle edema Neuro: A&Ox4, maew   Recent Labs Lab 04/20/14 0248 04/21/14 0530  04/22/14 0520  NA 142 143 142  K 3.7 4.3 4.0  CL 106 107 108  CO2 24 24 27   BUN 44* 45* 47*  CREATININE 1.76* 1.78* 1.45*  GLUCOSE 158* 151* 146*    Recent Labs Lab 04/21/14 0530 04/22/14 0520 04/23/14 0500  HGB 8.7* 8.6* 9.4*  HCT 27.3* 26.7* 29.4*  WBC 9.7 8.3 11.9*  PLT 253 243 222   Dg Chest Port 1 View  04/21/2014   CLINICAL DATA:  Re-evaluate pleural effusions; cough and shortness of breath ; history of lung malignancy.  EXAM: PORTABLE CHEST - 1 VIEW  COMPARISON:  Portable chest x-ray of April 20, 2014  FINDINGS: The lungs are mildly hypoinflated. There are persistently increased interstitial markings bilaterally. A small amount of pleural fluid on the right is present blunting the costophrenic angle and lying in the minor fissure. The cardiac silhouette is top-normal in size. The pulmonary vascularity is not engorged. The mediastinum is normal in width. A PICC line is in place whose tip lies just proximal to the cavoatrial junction. The bony thorax is unremarkable.  IMPRESSION: Persistently increased pulmonary interstitial markings without significant change from the previous study. There is persistent pleural thickening along the right lateral thoracic wall. A small amount of pleural fluid may be present as well.   Electronically Signed   By: David  Martinique   On: 04/21/2014 09:58   ASSESSMENT / PLAN:  PULMONARY OETT n/a A: Acute Hypoxemic Respiratory Failure - felt to be related to Pemetrexed (see  previous notes); given slow progress I fear there is a fibrotic process here (NSIP reported in addition to multiple other pulmonary toxicity syndromes) > 12/22 > pulm edema?  Recent Incidental Left PE (03/03/14) on anticoagulation with xarelto, repeat CT 04/2014 without PE P:  O2 for pusle ox > 88% OK to ambulate, work with PT (strongly encouraged, supplement O2 as needed) Change solumedrol to prednisone 60mg  12/22 Incentive spirometry Pulmicort + Duoneb PRN Plan is to  continue steroids for pneumonitis, hopefully transition to home if O2 improves (see below) If no improvement by next week, may need to consider hospice interventions.  Still hoping for steroid response.  Will need to arrange for home O2  CARDIOVASCULAR CVL none A: Hypertension Demand ischemia on 12/18, echo normal; no evidence of CHF P:  ASA Tele Switch back to Xarelto 12/22  RENAL A:  BPH on proscar and hytrin AKI - improving  P:  Continue BPH meds Trend BMP Avoid nephrotoxic agents as able   GASTROINTESTINAL A:  Hx of diverticulosis Hemorrhoids P:  PPI Monitor for bleeding  ONCOLOGY A :  Mesothelioma P: Monitor, no acute interventions  HEMATOLOGIC A:  PE P:  PRBC per ICU guidelines Continue to monitor for bleeding Xarelto   INFECTIOUS A:  Does not have HCAP (no cough, fever, procalcitonin negative)  P:  Monitor off abx Bronchoscopy if intubated  ENDOCRINE A:  Risk of hyperglycemia on steroids P:  SSI  NEUROLOGIC A:  Anxiety Cancer related pain P:  Continue prn ativan, reduce dosing as pt sedate 12/21 am  Continue prn oxycodone  FAMILY  - Updates: Wife and patient updated on rounds 12/23.  Discussed with wife regarding intubation.  They would not want intubation, CPR or any form of ACLS.  However, they want to continue aggressive medical care up to the point that he appears to be transitioning toward death then we would focus on comfort.    Noe Gens, NP-C [facility] Pgr: [phone] or [phone]    Attending:  I have seen and examined the patient with nurse practitioner/resident and agree with the note above.   On my exam today Sean Mitchell' lungs have not changed, he still has loud course crackles in the left lung, diminished on the right.  Lasix didn't help.  I explained to them that I think it is reasonable to continue steroids through next week, but if he doesn't make improvement then we  should consider hospice.  They agree with this plan.  Agree with DNR as noted above.  Mechanical ventilation would only hurt him, not help him.  Roselie Awkward, MD [place] [facility] Pager: [phone] Cell: [phone] If no response, call [phone]

## 2014-04-23 NOTE — Progress Notes (Signed)
Patient has extremely labored breathing on exertion. Patient insisted on standing on side of bed to urinate despite education from RN. Upon help to stand patient oxygen saturation immediately decreased to mid 70's. Patient placed on NRB 15L. Patient placed back in bed. Took atleast 41min for patient to recover her oxygen saturations.

## 2014-04-24 ENCOUNTER — Inpatient Hospital Stay (HOSPITAL_COMMUNITY): Payer: Medicare Other

## 2014-04-24 LAB — BASIC METABOLIC PANEL
Anion gap: 6 (ref 5–15)
BUN: 79 mg/dL — ABNORMAL HIGH (ref 6–23)
CALCIUM: 8.5 mg/dL (ref 8.4–10.5)
CO2: 26 mmol/L (ref 19–32)
CREATININE: 2.4 mg/dL — AB (ref 0.50–1.35)
Chloride: 109 mEq/L (ref 96–112)
GFR calc Af Amer: 27 mL/min — ABNORMAL LOW (ref >90)
GFR, EST NON AFRICAN AMERICAN: 24 mL/min — AB (ref >90)
GLUCOSE: 133 mg/dL — AB (ref 70–99)
Potassium: 4.1 mmol/L (ref 3.5–5.1)
Sodium: 141 mmol/L (ref 135–145)

## 2014-04-24 LAB — GLUCOSE, CAPILLARY
GLUCOSE-CAPILLARY: 218 mg/dL — AB (ref 70–99)
GLUCOSE-CAPILLARY: 68 mg/dL — AB (ref 70–99)
Glucose-Capillary: 185 mg/dL — ABNORMAL HIGH (ref 70–99)
Glucose-Capillary: 70 mg/dL (ref 70–99)
Glucose-Capillary: 116 mg/dL — ABNORMAL HIGH (ref 70–99)
Glucose-Capillary: 130 mg/dL — ABNORMAL HIGH (ref 70–99)

## 2014-04-24 LAB — CBC WITH DIFFERENTIAL/PLATELET
Basophils Absolute: 0 10*3/uL (ref 0.0–0.1)
Basophils Relative: 0 % (ref 0–1)
EOS PCT: 0 % (ref 0–5)
Eosinophils Absolute: 0 10*3/uL (ref 0.0–0.7)
HCT: 26.8 % — ABNORMAL LOW (ref 39.0–52.0)
Hemoglobin: 8.9 g/dL — ABNORMAL LOW (ref 13.0–17.0)
LYMPHS ABS: 0.9 10*3/uL (ref 0.7–4.0)
Lymphocytes Relative: 10 % — ABNORMAL LOW (ref 12–46)
MCH: 31.2 pg (ref 26.0–34.0)
MCHC: 33.2 g/dL (ref 30.0–36.0)
MCV: 94 fL (ref 78.0–100.0)
MONO ABS: 0.5 10*3/uL (ref 0.1–1.0)
Monocytes Relative: 6 % (ref 3–12)
Neutro Abs: 8.4 10*3/uL — ABNORMAL HIGH (ref 1.7–7.7)
Neutrophils Relative %: 85 % — ABNORMAL HIGH (ref 43–77)
PLATELETS: 148 10*3/uL — AB (ref 150–400)
RBC: 2.85 MIL/uL — AB (ref 4.22–5.81)
RDW: 17.2 % — ABNORMAL HIGH (ref 11.5–15.5)
WBC: 9.9 10*3/uL (ref 4.0–10.5)

## 2014-04-24 MED ORDER — MORPHINE SULFATE 10 MG/5ML PO SOLN
2.5000 mg | ORAL | Status: DC | PRN
  Administered 2014-04-24 – 2014-04-26 (×11): 2.5 mg via ORAL
  Filled 2014-04-24 (×12): qty 5

## 2014-04-24 MED ORDER — SODIUM CHLORIDE 0.9 % IV BOLUS (SEPSIS)
500.0000 mL | Freq: Once | INTRAVENOUS | Status: AC
  Administered 2014-04-24: 500 mL via INTRAVENOUS
  Administered 2014-04-24: 10:00:00 via INTRAVENOUS

## 2014-04-24 MED ORDER — DOCUSATE SODIUM 100 MG PO CAPS
100.0000 mg | ORAL_CAPSULE | Freq: Two times a day (BID) | ORAL | Status: DC
  Administered 2014-04-24 – 2014-04-26 (×5): 100 mg via ORAL
  Filled 2014-04-24 (×5): qty 1

## 2014-04-24 MED ORDER — PREDNISONE 20 MG PO TABS
40.0000 mg | ORAL_TABLET | Freq: Every day | ORAL | Status: DC
  Administered 2014-04-25 – 2014-04-26 (×2): 40 mg via ORAL
  Filled 2014-04-24 (×2): qty 2

## 2014-04-24 MED ORDER — RIVAROXABAN 20 MG PO TABS
20.0000 mg | ORAL_TABLET | Freq: Every day | ORAL | Status: DC
  Administered 2014-04-25: 20 mg via ORAL
  Filled 2014-04-24 (×3): qty 1

## 2014-04-24 MED ORDER — BISACODYL 5 MG PO TBEC: 5.0000 mg | DELAYED_RELEASE_TABLET | Freq: Every day | ORAL | Status: DC | PRN

## 2014-04-24 NOTE — Progress Notes (Signed)
Physical Therapy Treatment Patient Details Name: Sean Mitchell. MRN: [id number] DOB: 1934/03/08 Today's Date: 04/24/2014    History of Present Illness Sean Mclees. is a 80 y.o. male with PMH of mesothelioma (has just finish chemotherapy appro 4-5 weeks ago), HTN, BPH, GERD, PE; who presented to ED complaining of SOB.     PT Comments    Pt cooperative but ltd by c/o SOB and anxiety with mobilization.  Pt desat to 82% during session with multiple rests required for task completion  Follow Up Recommendations  Home health PT     Equipment Recommendations  None recommended by PT    Recommendations for Other Services       Precautions / Restrictions Precautions Precautions: Fall Restrictions Weight Bearing Restrictions: No    Mobility  Bed Mobility Overal bed mobility: Needs Assistance Bed Mobility: Supine to Sit     Supine to sit: Min guard     General bed mobility comments: cues for sequence  Transfers Overall transfer level: Needs assistance Equipment used: Rolling walker (2 wheeled) Transfers: Sit to/from Stand Sit to Stand: Min assist         General transfer comment: cues for use of UEs, min assist to steady with stand from low chair  Ambulation/Gait Ambulation/Gait assistance: Min assist Ambulation Distance (Feet): 6 Feet Assistive device: Rolling walker (2 wheeled) Gait Pattern/deviations: Decreased step length - right;Decreased step length - left;Shuffle     General Gait Details: cues for posture, pace and position from RW.   Stairs            Wheelchair Mobility    Modified Rankin (Stroke Patients Only)       Balance                                    Cognition Arousal/Alertness: Awake/alert Behavior During Therapy: WFL for tasks assessed/performed Overall Cognitive Status: Within Functional Limits for tasks assessed                      Exercises General Exercises - Lower Extremity Ankle  Circles/Pumps: AROM;Both;15 reps;Supine Quad Sets: AROM;Both;10 reps;Supine Heel Slides: AROM;10 reps;Supine;Both Hip ABduction/ADduction: AROM;Both;10 reps;Supine    General Comments        Pertinent Vitals/Pain Pain Assessment: No/denies pain    Home Living                      Prior Function            PT Goals (current goals can now be found in the care plan section) Acute Rehab PT Goals Patient Stated Goal: Walk without assist PT Goal Formulation: With patient/family Time For Goal Achievement: 05/01/14 Potential to Achieve Goals: Fair Progress towards PT goals: Not progressing toward goals - comment (decreased activity tolerance 2* SOB)    Frequency  Min 3X/week    PT Plan Current plan remains appropriate    Co-evaluation             End of Session Equipment Utilized During Treatment: Oxygen Activity Tolerance: Treatment limited secondary to medical complications (Comment);Other (comment) Patient left: in chair;with call bell/phone within reach;with family/visitor present     Time: 1105-1140 PT Time Calculation (min) (ACUTE ONLY): 35 min  Charges:  $Therapeutic Exercise: 8-22 mins $Therapeutic Activity: 8-22 mins  G Codes:      Sean Mitchell 16-May-2014, 12:33 PM

## 2014-04-24 NOTE — Progress Notes (Addendum)
Name: Sean Mitchell. MRN: [id number] DOB: 1933/08/21    ADMISSION DATE:  04/12/2014 CONSULTATION DATE:  12/17  REFERRING MD :  Charlies Silvers   CHIEF COMPLAINT/reason for consult:  Dyspnea and pulmonary infiltrates   BRIEF PATIENT DESCRIPTION:  80 year old male s/p 6 cycles of systemic chemo w/ Cisplatin and Alimta for right malignant pleural Mesothelioma. His last round of Chemo was 10/20. PCCM consulted regarding L >> R scattered ground glass infiltrates.   SIGNIFICANT EVENTS  12/16  Admitted, rx'd w/ IV hydration and empirically covered w/ vanc/zosyn 12/17  PCCM asked to see re: CT findings below 12/21  Dyspnea about the same, worked with PT, swelling in hands; BP dropped yesterday with ambulation   STUDIES:  12/16  CT Chest >> No evidence of acute pulmonary embolism. Mosaic profusion abnormality in the left upper lobe and left lower lobe. Question bronchiolitis with no other evidence of vascular etiology. Thickened pleural rind throughout the right hemi thorax in this patient with pleural mesothelioma may be slightly more thickened possibly indicating some progression of the disease in the interval. 12/18  ECHO >>  LVEF 60%, mild MR, RV normal   SUBJECTIVE:  RN reports pt very unsteady on feet using urinal.  Pt indicates he is weaker than yesterday, gets dizzy / lightheaded with getting up.    VITAL SIGNS: Temp:  [97.5 F (36.4 C)-98.1 F (36.7 C)] 97.5 F (36.4 C) (12/24 0400) Pulse Rate:  [44-78] 54 (12/24 0600) Resp:  [14-27] 20 (12/24 0600) BP: (114-142)/(60-74) 137/70 mmHg (12/24 0600) SpO2:  [87 %-99 %] 92 % (12/24 0600)  PHYSICAL EXAMINATION:  Gen: breathing comfortably at rest  HEENT: NCAT, EOMi PULM: Crackles L lung, diminished R lung CV: RRR, no mgr AB: BS+, soft, nontender Ext: warm, improved hand and ankle edema Neuro: A&Ox4, maew   Recent Labs Lab 04/21/14 0530 04/22/14 0520 04/24/14 0356  NA 143 142 141  K 4.3 4.0 4.1  CL 107 108 109  CO2 24 27  26   BUN 45* 47* 79*  CREATININE 1.78* 1.45* 2.40*  GLUCOSE 151* 146* 133*    Recent Labs Lab 04/22/14 0520 04/23/14 0500 04/24/14 0356  HGB 8.6* 9.4* 8.9*  HCT 26.7* 29.4* 26.8*  WBC 8.3 11.9* 9.9  PLT 243 222 148*   Dg Chest Port 1 View  04/24/2014   CLINICAL DATA:  Respiratory failure.  EXAM: PORTABLE CHEST - 1 VIEW  COMPARISON:  04/21/2014.  CT 04/06/2014.  FINDINGS: PICC line in good anatomic position. Mediastinum hilar structures are normal. Heart size normal. Diffuse left lung pulmonary infiltrate again noted. No interim improvement. Small left pleural effusion. Persistent right pleural thickening. No pneumothorax. No acute osseous abnormality.  IMPRESSION: 1. PICC line in stable position. 2. Diffuse left lung pulmonary infiltrate again noted. No interim change. Small left pleural effusion. 3. Unchanged right pleural thickening.   Electronically Signed   By: Marcello Moores  Register   On: 04/24/2014 07:07   ASSESSMENT / PLAN:  PULMONARY OETT n/a A: Acute Hypoxemic Respiratory Failure - felt to be related to Pemetrexed (see previous notes); given slow progress I fear there is a fibrotic process here (NSIP reported in addition to multiple other pulmonary toxicity syndromes)  Recent Incidental Left PE (03/03/14) on anticoagulation with xarelto, repeat CT 04/2014 without PE P:  O2 for pusle ox > 88% OK to ambulate, work with PT (strongly encouraged, supplement O2 as needed) Wean prednisone to 40mg , maintain on that dose through weekend Incentive spirometry Pulmicort +  Duoneb PRN Plan is to continue steroids for pneumonitis, hopefully transition to home if O2 improves (see below) If no improvement by next week, may need to consider hospice interventions.  Still hoping for steroid response.  Will need to arrange for home O2  CARDIOVASCULAR CVL none A: Hypertension Demand ischemia on 12/18, echo normal; no evidence of CHF P:  ASA Tele Switch back to Xarelto 12/22  RENAL A:   BPH on proscar and hytrin AKI - worse 12/24 P:  Continue BPH meds Trend BMP Avoid nephrotoxic agents as able Saline bolus 12/24, repeat BMET in AM  GASTROINTESTINAL A:  Hx of diverticulosis Hemorrhoids P:  PPI Monitor for bleeding  ONCOLOGY A :  Mesothelioma P: Monitor, no acute interventions  HEMATOLOGIC A:  PE P:  PRBC per ICU guidelines Continue to monitor for bleeding Xarelto   INFECTIOUS A:  Does not have HCAP (no cough, fever, procalcitonin negative)  P:  Monitor off abx Bronchoscopy if intubated  ENDOCRINE A:  Risk of hyperglycemia on steroids P:  SSI  NEUROLOGIC A:  Anxiety Cancer related pain P:  Add morphine prn dyspnea Continue prn oxycodone for now, but with addition of morphine may want to change oxy to MS contin + MSIR  FAMILY  - Updates: Wife, patient, son and daughter updated on rounds multiple times in lengthy conversations on 12/23.  Discussed with wife regarding intubation.  They would not want intubation, CPR or any form of ACLS.  However, they want to continue aggressive medical care up to the point that he appears to be transitioning toward death then we would focus on comfort.    Roselie Awkward, MD [facility] Pager: [phone] Cell: [phone] If no response, call [phone]

## 2014-04-24 NOTE — Progress Notes (Signed)
04/24/14 1400  Clinical Encounter Type  Visited With Family (wife [name]; met son, DIL, granddtrs)  Visit Type Initial;Spiritual support;Social support  Referral From Nurse  Spiritual Encounters  Spiritual Needs Emotional;Prayer (anticipatory grief)  Stress Factors  Patient Stress Factors Health changes  Family Stress Factors Major life changes;Loss of control   Visited privately in consult room per wife Linda's request, providing opportunity for her to share about their almost 15-year marriage, the story of his mesothelioma and tx, neighbors' well-intentioned but stressful gestures of support, fear/sorrow/loneliness, and initial feelings about MD's mention of hospice.  Provided empathic listening, pastoral reflection, normalization of feelings, discernment assistance re hospice care and support, and prayer per request.  [name] verbalized appreciation.  Family aware of ongoing chaplain availability for support, but please also page as needs arise:  [phone].  Thank you.  [place], [place]

## 2014-04-25 LAB — GLUCOSE, CAPILLARY
GLUCOSE-CAPILLARY: 116 mg/dL — AB (ref 70–99)
GLUCOSE-CAPILLARY: 141 mg/dL — AB (ref 70–99)
Glucose-Capillary: 149 mg/dL — ABNORMAL HIGH (ref 70–99)

## 2014-04-25 LAB — BASIC METABOLIC PANEL
Anion gap: 7 (ref 5–15)
BUN: 83 mg/dL — AB (ref 6–23)
CHLORIDE: 106 meq/L (ref 96–112)
CO2: 28 mmol/L (ref 19–32)
Calcium: 8.5 mg/dL (ref 8.4–10.5)
Creatinine, Ser: 2.47 mg/dL — ABNORMAL HIGH (ref 0.50–1.35)
GFR, EST AFRICAN AMERICAN: 27 mL/min — AB (ref >90)
GFR, EST NON AFRICAN AMERICAN: 23 mL/min — AB (ref >90)
GLUCOSE: 119 mg/dL — AB (ref 70–99)
POTASSIUM: 4.5 mmol/L (ref 3.5–5.1)
Sodium: 141 mmol/L (ref 135–145)

## 2014-04-25 LAB — CBC WITH DIFFERENTIAL/PLATELET
BASOS PCT: 0 % (ref 0–1)
Basophils Absolute: 0 10*3/uL (ref 0.0–0.1)
EOS ABS: 0.4 10*3/uL (ref 0.0–0.7)
Eosinophils Relative: 4 % (ref 0–5)
HEMATOCRIT: 29.2 % — AB (ref 39.0–52.0)
HEMOGLOBIN: 9.2 g/dL — AB (ref 13.0–17.0)
LYMPHS ABS: 1.4 10*3/uL (ref 0.7–4.0)
Lymphocytes Relative: 16 % (ref 12–46)
MCH: 30.4 pg (ref 26.0–34.0)
MCHC: 31.5 g/dL (ref 30.0–36.0)
MCV: 96.4 fL (ref 78.0–100.0)
MONO ABS: 0.5 10*3/uL (ref 0.1–1.0)
Monocytes Relative: 6 % (ref 3–12)
Neutro Abs: 6.7 10*3/uL (ref 1.7–7.7)
Neutrophils Relative %: 74 % (ref 43–77)
Platelets: 118 10*3/uL — ABNORMAL LOW (ref 150–400)
RBC: 3.03 MIL/uL — AB (ref 4.22–5.81)
RDW: 17.9 % — ABNORMAL HIGH (ref 11.5–15.5)
WBC: 9.1 10*3/uL (ref 4.0–10.5)

## 2014-04-25 MED ORDER — BUDESONIDE 0.5 MG/2ML IN SUSP
0.5000 mg | Freq: Two times a day (BID) | RESPIRATORY_TRACT | Status: DC
  Administered 2014-04-25 – 2014-04-26 (×3): 0.5 mg via RESPIRATORY_TRACT
  Filled 2014-04-25 (×4): qty 2

## 2014-04-25 NOTE — Progress Notes (Signed)
Name: Sean Mitchell. MRN: [id number] DOB: 01-15-1934    ADMISSION DATE:  04/15/2014 CONSULTATION DATE:  12/17  REFERRING MD :  Charlies Silvers   CHIEF COMPLAINT/reason for consult:  Dyspnea and pulmonary infiltrates   BRIEF PATIENT DESCRIPTION:  80 year old male s/p 6 cycles of systemic chemo w/ Cisplatin and Alimta for right malignant pleural Mesothelioma. His last round of Chemo was 10/20. PCCM consulted 12/17 regarding L >> R scattered ground glass infiltrates.   SIGNIFICANT EVENTS  12/16  Admitted, rx'd w/ IV hydration and empirically covered w/ vanc/zosyn 12/21  Dyspnea about the same, worked with PT, swelling in hands; BP dropped yesterday with ambulation   STUDIES:  12/16  CT Chest >> No evidence of acute pulmonary embolism. Mosaic profusion abnormality in the left upper lobe and left lower lobe. Question bronchiolitis with no other evidence of vascular etiology. Thickened pleural rind throughout the right hemi thorax in this patient with pleural mesothelioma may be slightly more thickened possibly indicating some progression of the disease in the interval. 12/18  ECHO >>  LVEF 60%, mild MR, RV normal   SUBJECTIVE:  C/o feeling sleepy with ativan/ morphine Slept well O2 requirements same  VITAL SIGNS: Temp:  [97.5 F (36.4 C)-97.8 F (36.6 C)] 97.8 F (36.6 C) (12/25 0800) Pulse Rate:  [60-70] 63 (12/25 0600) Resp:  [15-26] 18 (12/25 0600) BP: (105-149)/(46-71) 121/52 mmHg (12/25 0600) SpO2:  [89 %-97 %] 95 % (12/25 0902)  PHYSICAL EXAMINATION:  Gen: breathing comfortably at rest  HEENT: NCAT, EOMi PULM: Crackles L lung, diminished R lung CV: RRR, no mgr AB: BS+, soft, nontender Ext: warm, improved hand and ankle edema Neuro: A&Ox4, maew   Recent Labs Lab 04/22/14 0520 04/24/14 0356 04/25/14 0550  NA 142 141 141  K 4.0 4.1 4.5  CL 108 109 106  CO2 27 26 28   BUN 47* 79* 83*  CREATININE 1.45* 2.40* 2.47*  GLUCOSE 146* 133* 119*    Recent Labs Lab  04/23/14 0500 04/24/14 0356 04/25/14 0550  HGB 9.4* 8.9* 9.2*  HCT 29.4* 26.8* 29.2*  WBC 11.9* 9.9 9.1  PLT 222 148* 118*   Dg Chest Port 1 View  04/24/2014   CLINICAL DATA:  Shortness of breath.  EXAM: PORTABLE CHEST - 1 VIEW  COMPARISON:  04/24/2014.  09/30/2013.  FINDINGS: Right PICC line noted in stable position. Heart size normal. Diffuse left lung infiltrate again noted. Slight interim clearing. Mild stable right base subsegmental atelectasis and right pleural effusion. No pneumothorax.  IMPRESSION: 1. Right PICC line in stable position . 2. Diffuse left lung infiltrate consistent pneumonia. Slight interim clearing. 3. Mild right basilar subsegmental atelectasis and small right pleural effusion.   Electronically Signed   By: Marcello Moores  Register   On: 04/24/2014 08:51   Dg Chest Port 1 View  04/24/2014   CLINICAL DATA:  Respiratory failure.  EXAM: PORTABLE CHEST - 1 VIEW  COMPARISON:  04/21/2014.  CT 04/06/2014.  FINDINGS: PICC line in good anatomic position. Mediastinum hilar structures are normal. Heart size normal. Diffuse left lung pulmonary infiltrate again noted. No interim improvement. Small left pleural effusion. Persistent right pleural thickening. No pneumothorax. No acute osseous abnormality.  IMPRESSION: 1. PICC line in stable position. 2. Diffuse left lung pulmonary infiltrate again noted. No interim change. Small left pleural effusion. 3. Unchanged right pleural thickening.   Electronically Signed   By: Marcello Moores  Register   On: 04/24/2014 07:07   ASSESSMENT / PLAN:  PULMONARY OETT n/a  A: Acute Hypoxemic Respiratory Failure - felt to be related to Pemetrexed (NSIP reported in addition to multiple other pulmonary toxicity syndromes)  Recent Incidental Left PE (03/03/14) on anticoagulation with xarelto, repeat CT 04/2014 without PE P:  O2 for pusle ox > 88% OK to ambulate, work with PT (strongly encouraged, supplement O2 as needed) Wean prednisone to 40mg , maintain on that  dose through weekend Incentive spirometry Pulmicort + Duoneb PRN Plan is to continue steroids for pneumonitis, hopefully transition to home if O2 improves (see below) If no improvement by next week, may need to consider hospice interventions.  Still hoping for steroid response.  Will need to arrange for home O2  CARDIOVASCULAR CVL none A: Hypertension Demand ischemia on 12/18, echo normal; no evidence of CHF P:  ASA ct Xarelto   RENAL A:  BPH on proscar and hytrin AKI - worse 12/24, baseline 1.4 P:  Continue BPH meds Trend BMP Avoid nephrotoxic agents as able   GASTROINTESTINAL A:  Hx of diverticulosis Hemorrhoids P:  PPI Monitor for bleeding  ONCOLOGY A :  Mesothelioma P: Monitor, no acute interventions  HEMATOLOGIC A:  PE P:  PRBC per ICU guidelines Continue to monitor for bleeding Xarelto   INFECTIOUS A:  Does not have HCAP (no cough, fever, procalcitonin negative)  P:  Monitor off abx Bronchoscopy if intubated  ENDOCRINE A:  Risk of hyperglycemia on steroids P:  SSI  NEUROLOGIC A:  Anxiety Cancer related pain P:  Add morphine prn dyspnea Dc oxycodone for now, guage morphine requirements over weekend , then change oxy to MS contin + MSIR  FAMILY  - Updates: Wife, patient, son and daughter updated on rounds multiple times in lengthy conversations Discussed with wife regarding intubation.  They would not want intubation, CPR or any form of ACLS.  However, they want to continue aggressive medical care up to the point that he appears to be transitioning toward death then we would focus on comfort.    Kara Mead MD. Shade Flood. [facility] Pager [phone] If no response call [phone]

## 2014-04-26 LAB — GLUCOSE, CAPILLARY
GLUCOSE-CAPILLARY: 126 mg/dL — AB (ref 70–99)
GLUCOSE-CAPILLARY: 116 mg/dL — AB (ref 70–99)
Glucose-Capillary: 117 mg/dL — ABNORMAL HIGH (ref 70–99)
Glucose-Capillary: 135 mg/dL — ABNORMAL HIGH (ref 70–99)

## 2014-04-26 LAB — BASIC METABOLIC PANEL
Anion gap: 5 (ref 5–15)
BUN: 81 mg/dL — AB (ref 6–23)
CALCIUM: 8.7 mg/dL (ref 8.4–10.5)
CHLORIDE: 107 meq/L (ref 96–112)
CO2: 28 mmol/L (ref 19–32)
CREATININE: 2.44 mg/dL — AB (ref 0.50–1.35)
GFR calc Af Amer: 28 mL/min — ABNORMAL LOW (ref >90)
GFR calc non Af Amer: 24 mL/min — ABNORMAL LOW (ref >90)
GLUCOSE: 140 mg/dL — AB (ref 70–99)
Potassium: 4.6 mmol/L (ref 3.5–5.1)
Sodium: 140 mmol/L (ref 135–145)

## 2014-04-26 LAB — CBC WITH DIFFERENTIAL/PLATELET
Basophils Absolute: 0 10*3/uL (ref 0.0–0.1)
Basophils Relative: 0 % (ref 0–1)
EOS PCT: 0 % (ref 0–5)
Eosinophils Absolute: 0 10*3/uL (ref 0.0–0.7)
HEMATOCRIT: 28.4 % — AB (ref 39.0–52.0)
HEMOGLOBIN: 9.1 g/dL — AB (ref 13.0–17.0)
LYMPHS ABS: 0.8 10*3/uL (ref 0.7–4.0)
LYMPHS PCT: 7 % — AB (ref 12–46)
MCH: 30.3 pg (ref 26.0–34.0)
MCHC: 32 g/dL (ref 30.0–36.0)
MCV: 94.7 fL (ref 78.0–100.0)
MONO ABS: 0.6 10*3/uL (ref 0.1–1.0)
MONOS PCT: 6 % (ref 3–12)
NEUTROS ABS: 9.4 10*3/uL — AB (ref 1.7–7.7)
Neutrophils Relative %: 87 % — ABNORMAL HIGH (ref 43–77)
Platelets: 100 10*3/uL — ABNORMAL LOW (ref 150–400)
RBC: 3 MIL/uL — AB (ref 4.22–5.81)
RDW: 17.5 % — ABNORMAL HIGH (ref 11.5–15.5)
WBC: 10.8 10*3/uL — AB (ref 4.0–10.5)

## 2014-04-26 MED ORDER — MORPHINE SULFATE 25 MG/ML IV SOLN
1.0000 mg/h | INTRAVENOUS | Status: DC
  Filled 2014-04-26: qty 10

## 2014-04-26 MED ORDER — MORPHINE SULFATE 25 MG/ML IV SOLN
1.0000 mg/h | INTRAVENOUS | Status: DC
  Administered 2014-04-26: 2 mg/h via INTRAVENOUS
  Filled 2014-04-26: qty 10

## 2014-04-26 MED ORDER — VITAMINS A & D EX OINT
TOPICAL_OINTMENT | CUTANEOUS | Status: AC
  Administered 2014-04-26: 1
  Filled 2014-04-26: qty 5

## 2014-04-26 NOTE — Progress Notes (Signed)
[facility] Progress Note Patient Name: Sean Mitchell. DOB: [birth year] MRN: [id number]   Date of Service  04/26/2014  HPI/Events of Note  Not able to swallow pain meds well  eICU Interventions  Start Morphine at 1 mg per hour and titrate to comfort      Intervention Category Minor Interventions: Routine modifications to care plan (e.g. PRN medications for pain, fever)  Christinia Gully 04/26/2014, 10:07 PM

## 2014-04-26 NOTE — Progress Notes (Signed)
Name: Sean Mitchell. MRN: [id number] DOB: 09-04-1933    ADMISSION DATE:  05/01/2014 CONSULTATION DATE:  12/17  REFERRING MD :  Charlies Silvers   CHIEF COMPLAINT/reason for consult:  Dyspnea and pulmonary infiltrates   BRIEF PATIENT DESCRIPTION:  80 year old male s/p 6 cycles of systemic chemo w/ Cisplatin and Alimta for right malignant pleural Mesothelioma. His last round of Chemo was 10/20. PCCM consulted 12/17 regarding L >> R scattered ground glass infiltrates.   SIGNIFICANT EVENTS  12/16  Admitted, rx'd w/ IV hydration and empirically covered w/ vanc/zosyn 12/21  Dyspnea about the same, worked with PT, swelling in hands; BP dropped yesterday with ambulation   STUDIES:  12/16  CT Chest >> No evidence of acute pulmonary embolism. Mosaic profusion abnormality in the left upper lobe and left lower lobe. Question bronchiolitis with no other evidence of vascular etiology. Thickened pleural rind throughout the right hemi thorax in this patient with pleural mesothelioma may be slightly more thickened possibly indicating some progression of the disease in the interval. 12/18  ECHO >>  LVEF 60%, mild MR, RV normal   SUBJECTIVE:  C/o nose bleed Slept well On face mask  VITAL SIGNS: Temp:  [97.4 F (36.3 C)-97.8 F (36.6 C)] 97.8 F (36.6 C) (12/26 0800) Pulse Rate:  [60-72] 70 (12/26 1029) Resp:  [15-23] 19 (12/26 0800) BP: (120-156)/(49-74) 141/64 mmHg (12/26 1029) SpO2:  [79 %-98 %] 79 % (12/26 0900) FiO2 (%):  [55 %] 55 % (12/26 0758)  PHYSICAL EXAMINATION:  Gen: on face mask, distressed with nose bleed HEENT: NCAT, EOMi PULM: Crackles L lung, diminished R lung CV: RRR, no mgr AB: BS+, soft, nontender Ext: warm, improved hand and ankle edema Neuro: A&Ox4, maew   Recent Labs Lab 04/24/14 0356 04/25/14 0550 04/26/14 0510  NA 141 141 140  K 4.1 4.5 4.6  CL 109 106 107  CO2 26 28 28   BUN 79* 83* 81*  CREATININE 2.40* 2.47* 2.44*  GLUCOSE 133* 119* 140*    Recent  Labs Lab 04/24/14 0356 04/25/14 0550 04/26/14 0510  HGB 8.9* 9.2* 9.1*  HCT 26.8* 29.2* 28.4*  WBC 9.9 9.1 10.8*  PLT 148* 118* 100*   No results found. ASSESSMENT / PLAN:  PULMONARY OETT n/a A: Acute Hypoxemic Respiratory Failure - felt to be related to Pemetrexed (NSIP reported in addition to multiple other pulmonary toxicity syndromes) Recent Incidental Left PE (03/03/14) on anticoagulation with xarelto, repeat CT 04/2014 without PE Epistaxis 12/26 P:  O2 for pusle ox > 88% OK to ambulate, work with PT (strongly encouraged, supplement O2 as needed) Wean prednisone to 40mg , maintain on that dose through weekend Incentive spirometry Pulmicort + Duoneb PRN Plan is to continue steroids for pneumonitis, hopefully transition to home if O2 improves (see below) If no improvement by next week, may need to consider hospice interventions.  Still hoping for steroid response.  Will need to arrange for home O2 Hold xarelto until nose bleed improves, humidify O2  CARDIOVASCULAR CVL none A: Hypertension Demand ischemia on 12/18, echo normal; no evidence of CHF P:  ASA   RENAL A:  BPH on proscar and hytrin AKI - worse 12/24, baseline 1.4 P:  Continue BPH meds Trend BMP Avoid nephrotoxic agents as able   GASTROINTESTINAL A:  Hx of diverticulosis Hemorrhoids P:  PPI Monitor for bleeding  ONCOLOGY A :  Mesothelioma P: Monitor, no acute interventions  HEMATOLOGIC A:  PE P:  PRBC per ICU guidelines  INFECTIOUS A:  Does not have HCAP (no cough, fever, procalcitonin negative)  P:  Monitor off abx   ENDOCRINE A:  Risk of hyperglycemia on steroids P:  SSI  NEUROLOGIC A:  Anxiety Cancer related pain P:  Add morphine prn dyspnea Dc oxycodone for now, guage morphine requirements over weekend , then change oxy to MS contin + MSIR  FAMILY  - Updates: Wife, patient, son and daughter updated on rounds multiple times in lengthy  conversations  They would not want intubation, CPR or any form of ACLS.  However, they want to continue aggressive medical care up to the point that he appears to be transitioning toward death then we would focus on comfort.    Kara Mead MD. Shade Flood. [facility] Pager [phone] If no response call [phone]

## 2014-04-26 NOTE — Progress Notes (Signed)
Family asking about end of life and goals of care. Would probably benefit from a family conference with the MD.

## 2014-04-26 NOTE — Progress Notes (Signed)
Patient has continuous complaints of shortness of breath to family and staff.  Patient has minimal oxygen reserve and frequently desats in the 80's and lower on 50-80% O2.  When anxiety decreases, his oxygen levels improve.  Family and patient have requested something to make him more comfortable.  Dr. Melvyn Novas notified and orders received for continuous morphine infusion.

## 2014-04-27 LAB — BASIC METABOLIC PANEL
ANION GAP: 3 — AB (ref 5–15)
BUN: 91 mg/dL — AB (ref 6–23)
CO2: 32 mmol/L (ref 19–32)
CREATININE: 2.66 mg/dL — AB (ref 0.50–1.35)
Calcium: 8.7 mg/dL (ref 8.4–10.5)
Chloride: 108 mEq/L (ref 96–112)
GFR calc Af Amer: 25 mL/min — ABNORMAL LOW (ref >90)
GFR, EST NON AFRICAN AMERICAN: 21 mL/min — AB (ref >90)
Glucose, Bld: 114 mg/dL — ABNORMAL HIGH (ref 70–99)
POTASSIUM: 5.5 mmol/L — AB (ref 3.5–5.1)
Sodium: 143 mmol/L (ref 135–145)

## 2014-04-27 LAB — CBC WITH DIFFERENTIAL/PLATELET
Basophils Absolute: 0 10*3/uL (ref 0.0–0.1)
Basophils Relative: 0 % (ref 0–1)
EOS ABS: 0 10*3/uL (ref 0.0–0.7)
Eosinophils Relative: 0 % (ref 0–5)
HCT: 27.7 % — ABNORMAL LOW (ref 39.0–52.0)
HEMOGLOBIN: 8.5 g/dL — AB (ref 13.0–17.0)
Lymphocytes Relative: 9 % — ABNORMAL LOW (ref 12–46)
Lymphs Abs: 1 10*3/uL (ref 0.7–4.0)
MCH: 30.2 pg (ref 26.0–34.0)
MCHC: 30.7 g/dL (ref 30.0–36.0)
MCV: 98.6 fL (ref 78.0–100.0)
MONO ABS: 0.7 10*3/uL (ref 0.1–1.0)
MONOS PCT: 7 % (ref 3–12)
NEUTROS ABS: 9 10*3/uL — AB (ref 1.7–7.7)
NEUTROS PCT: 84 % — AB (ref 43–77)
Platelets: 95 10*3/uL — ABNORMAL LOW (ref 150–400)
RBC: 2.81 MIL/uL — ABNORMAL LOW (ref 4.22–5.81)
RDW: 18 % — ABNORMAL HIGH (ref 11.5–15.5)
WBC: 10.7 10*3/uL — ABNORMAL HIGH (ref 4.0–10.5)

## 2014-04-27 LAB — GLUCOSE, CAPILLARY: Glucose-Capillary: 106 mg/dL — ABNORMAL HIGH (ref 70–99)

## 2014-04-27 NOTE — Progress Notes (Signed)
MD Elsworth Soho aware patient unable to swallow, including PO tablets/capsules.

## 2014-04-27 NOTE — Progress Notes (Signed)
Name: Sean Mitchell. MRN: [id number] DOB: June 21, 1933    ADMISSION DATE:  04/15/2014 CONSULTATION DATE:  12/17  REFERRING MD :  Charlies Silvers   CHIEF COMPLAINT/reason for consult:  Dyspnea and pulmonary infiltrates   BRIEF PATIENT DESCRIPTION:  80 year old male s/p 6 cycles of systemic chemo w/ Cisplatin and Alimta for right malignant pleural Mesothelioma. His last round of Chemo was 10/20. PCCM consulted 12/17 regarding L >> R scattered ground glass infiltrates.   SIGNIFICANT EVENTS  12/16  Admitted, rx'd w/ IV hydration and empirically covered w/ vanc/zosyn 12/21  Dyspnea about the same, worked with PT, swelling in hands; BP dropped yesterday with ambulation  12/26 started on morphine gtt for resp distress  STUDIES:  12/16  CT Chest >> No evidence of acute pulmonary embolism. Mosaic profusion abnormality in the left upper lobe and left lower lobe. Question bronchiolitis with no other evidence of vascular etiology. Thickened pleural rind throughout the right hemi thorax in this patient with pleural mesothelioma may be slightly more thickened possibly indicating some progression of the disease in the interval. 12/18  ECHO >>  LVEF 60%, mild MR, RV normal   SUBJECTIVE:   nose bleed stopped  started on morphine gtt for resp distress Appears comfortable, unresponsive  VITAL SIGNS: Temp:  [96.7 F (35.9 C)-98.4 F (36.9 C)] 97.7 F (36.5 C) (12/27 1134) Pulse Rate:  [45-132] 52 (12/27 0800) Resp:  [16-27] 16 (12/27 0800) BP: (79-128)/(32-58) 80/32 mmHg (12/27 0800) SpO2:  [87 %-100 %] 100 % (12/27 0800) FiO2 (%):  [45 %-55 %] 55 % (12/26 2000)  PHYSICAL EXAMINATION:  Gen: on [place], in no distress HEENT: NCAT, EOMi PULM: Crackles L lung, diminished R lung CV: RRR, no mgr AB: BS+, soft, nontender Ext: warm, improved hand and ankle edema Neuro: A&Ox4, maew   Recent Labs Lab 04/25/14 0550 04/26/14 0510 04/27/14 0550  NA 141 140 143  K 4.5 4.6 5.5*  CL 106 107 108  CO2 28  28 32  BUN 83* 81* 91*  CREATININE 2.47* 2.44* 2.66*  GLUCOSE 119* 140* 114*    Recent Labs Lab 04/25/14 0550 04/26/14 0510 04/27/14 0550  HGB 9.2* 9.1* 8.5*  HCT 29.2* 28.4* 27.7*  WBC 9.1 10.8* 10.7*  PLT 118* 100* 95*   No results found. ASSESSMENT / PLAN:  PULMONARY OETT n/a A: Acute Hypoxemic Respiratory Failure - felt to be related to Pemetrexed (NSIP reported in addition to multiple other pulmonary toxicity syndromes) Recent Incidental Left PE (03/03/14) on anticoagulation with xarelto, repeat CT 04/2014 without PE Epistaxis 12/26 P:  Duoneb PRN Morphine for comfort Hold xarelto   CARDIOVASCULAR CVL none A: Hypertension Demand ischemia on 12/18, echo normal; no evidence of CHF P:  ASA   RENAL A:  BPH on proscar and hytrin AKI - worse 12/24, baseline 1.4 P:  dc BPH meds -foley if needed for comfort    GASTROINTESTINAL A:  Hx of diverticulosis Hemorrhoids P:  PPI   ONCOLOGY A :  Mesothelioma P: Monitor, no acute interventions  HEMATOLOGIC A:  PE P:  No transfusions    INFECTIOUS A:  Does not have HCAP (no cough, fever, procalcitonin negative)  P:  Monitor off abx   ENDOCRINE A:  Risk of hyperglycemia on steroids P:  Dc SSI  NEUROLOGIC A:  Anxiety Cancer related pain P:  morphine gtt for  dyspnea Dc oxycodone for now, guage morphine requirements over weekend , then change oxy to MS contin + MSIR  FAMILY  -  Updates: Wife, patient, son and daughter updated on rounds multiple times in lengthy conversations  They would not want intubation, CPR or any form of ACLS. He appears to be transitioning toward death - we would focus on comfort.    Kara Mead MD. Shade Flood. [facility] Pager [phone] If no response call [phone]

## 2014-04-27 NOTE — Progress Notes (Signed)
Patient has been on Morphine drip through the night at 4 mg/hr.  Resp are even and unlabored.  Sats have been 95-99 on 6 liters [place] blood pressure is soft. Wife is at bedside and said she is , "thankful that he is not struggling to breath like he has been."

## 2014-04-27 NOTE — Progress Notes (Signed)
Patient has been on morphine drip throughout shift, now at 5 mg/hr. Respirations are agonal. Sats are 95% on 6L [place], BP soft 44/19, and HR fluctuates 48-52 bpm. Wife, son, and daughter at bedside. Family states, "We are ready for him to go." Continue to ensure and provide comfort to family and patient, offered chaplain and comfort cart at bedside.

## 2014-05-02 NOTE — Progress Notes (Signed)
120cc of 1mg /ml Morphine wasted down the sink with Jacolyn Reedy, RN

## 2014-05-02 NOTE — Progress Notes (Signed)
Patients time of death 0303 on 05/04/2014. Heart and Lung sounds were auscultated for a full minute with Jacolyn Reedy, RN with none to be heard. Family at bedside at time of passing. [facility] called and notified of patients passing.   [employer] with [employer] was called. Case number: [id number]. Pt is not suitable for organs, tissue, or eye donation.   Family has not decided on a funeral home for patient. Instructions and number given to call [employer] with a funeral home when they have decided.

## 2014-05-02 DEATH — deceased

## 2014-05-07 NOTE — Discharge Summary (Signed)
   Name: Sean Mitchell. MRN: [id number] DOB: 1934/01/27    ADMISSION DATE:  04/02/2014 CONSULTATION DATE:  12/17  REFERRING MD :  Charlies Silvers   CHIEF COMPLAINT/reason for consult:  Dyspnea and pulmonary infiltrates   BRIEF PATIENT DESCRIPTION:  81 year old male s/p 6 cycles of systemic chemo w/ Cisplatin and Alimta for right malignant pleural Mesothelioma. His last round of Chemo was 10/20. PCCM consulted 12/17 regarding L >> R scattered ground glass infiltrates.   SIGNIFICANT EVENTS  12/16  Admitted, rx'd w/ IV hydration and empirically covered w/ vanc/zosyn 12/21  Dyspnea about the same, worked with PT, swelling in hands; BP dropped yesterday with ambulation  12/26 started on morphine gtt for resp distress  STUDIES:  12/16  CT Chest >> No evidence of acute pulmonary embolism. Mosaic profusion abnormality in the left upper lobe and left lower lobe. Question bronchiolitis with no other evidence of vascular etiology. Thickened pleural rind throughout the right hemi thorax in this patient with pleural mesothelioma may be slightly more thickened possibly indicating some progression of the disease in the interval. 12/18  ECHO >>  LVEF 60%, mild MR, RV normal   COURSE :  PULMONARY  A: Acute Hypoxemic Respiratory Failure - felt to be related to Pemetrexed (NSIP reported in addition to multiple other pulmonary toxicity syndromes) Recent Incidental Left PE (03/03/14) on anticoagulation with xarelto, repeat CT 04/2014 without PE Epistaxis 12/26 P:  Duoneb PRN Held xarelto 12/26 onwards  CARDIOVASCULAR CVL none A: Hypertension Demand ischemia on 12/18, echo normal; no evidence of CHF P:  ASA   RENAL A:  BPH on proscar and hytrin AKI - worse 12/24, baseline 1.4 P:  dc BPH meds -foley if needed for comfort   INFECTIOUS A:  Does not have HCAP (no cough, fever, procalcitonin negative)  P:  Monitor off abx   NEUROLOGIC A:  Anxiety Cancer related pain P:   morphine gtt for  dyspnea Dc oxycodone for now, guage morphine requirements over weekend , then change oxy to MS contin + MSIR  FAMILY  - Updates: Wife, patient, son and daughter updated on rounds multiple times in lengthy conversations  They would not want intubation, CPR or any form of ACLS. Dyspnea worsened - placed on morphine gtt,eventually passed away.  Cause of death - Acute hypoxic resp failure, PE, mesothelioma   Kara Mead MD. FCCP. [facility] Pager [phone] If no response call [phone]

## 2014-06-06 ENCOUNTER — Other Ambulatory Visit: Payer: Medicare Other

## 2014-06-06 ENCOUNTER — Ambulatory Visit (HOSPITAL_COMMUNITY): Payer: Medicare Other

## 2014-06-09 ENCOUNTER — Ambulatory Visit: Payer: Medicare Other | Admitting: Internal Medicine

## 2015-10-19 ENCOUNTER — Other Ambulatory Visit: Payer: Self-pay | Admitting: Nurse Practitioner

## 2015-12-20 IMAGING — CR DG CHEST 2V
2 series · 2 of 2 positions shown · non-contrast
Comparison: DG CHEST 2 VIEW dated 09/02/2013

CLINICAL DATA: Pneumonia.  Cough.

EXAM:
CHEST  2 VIEW

[view not recorded (1 of 2)]
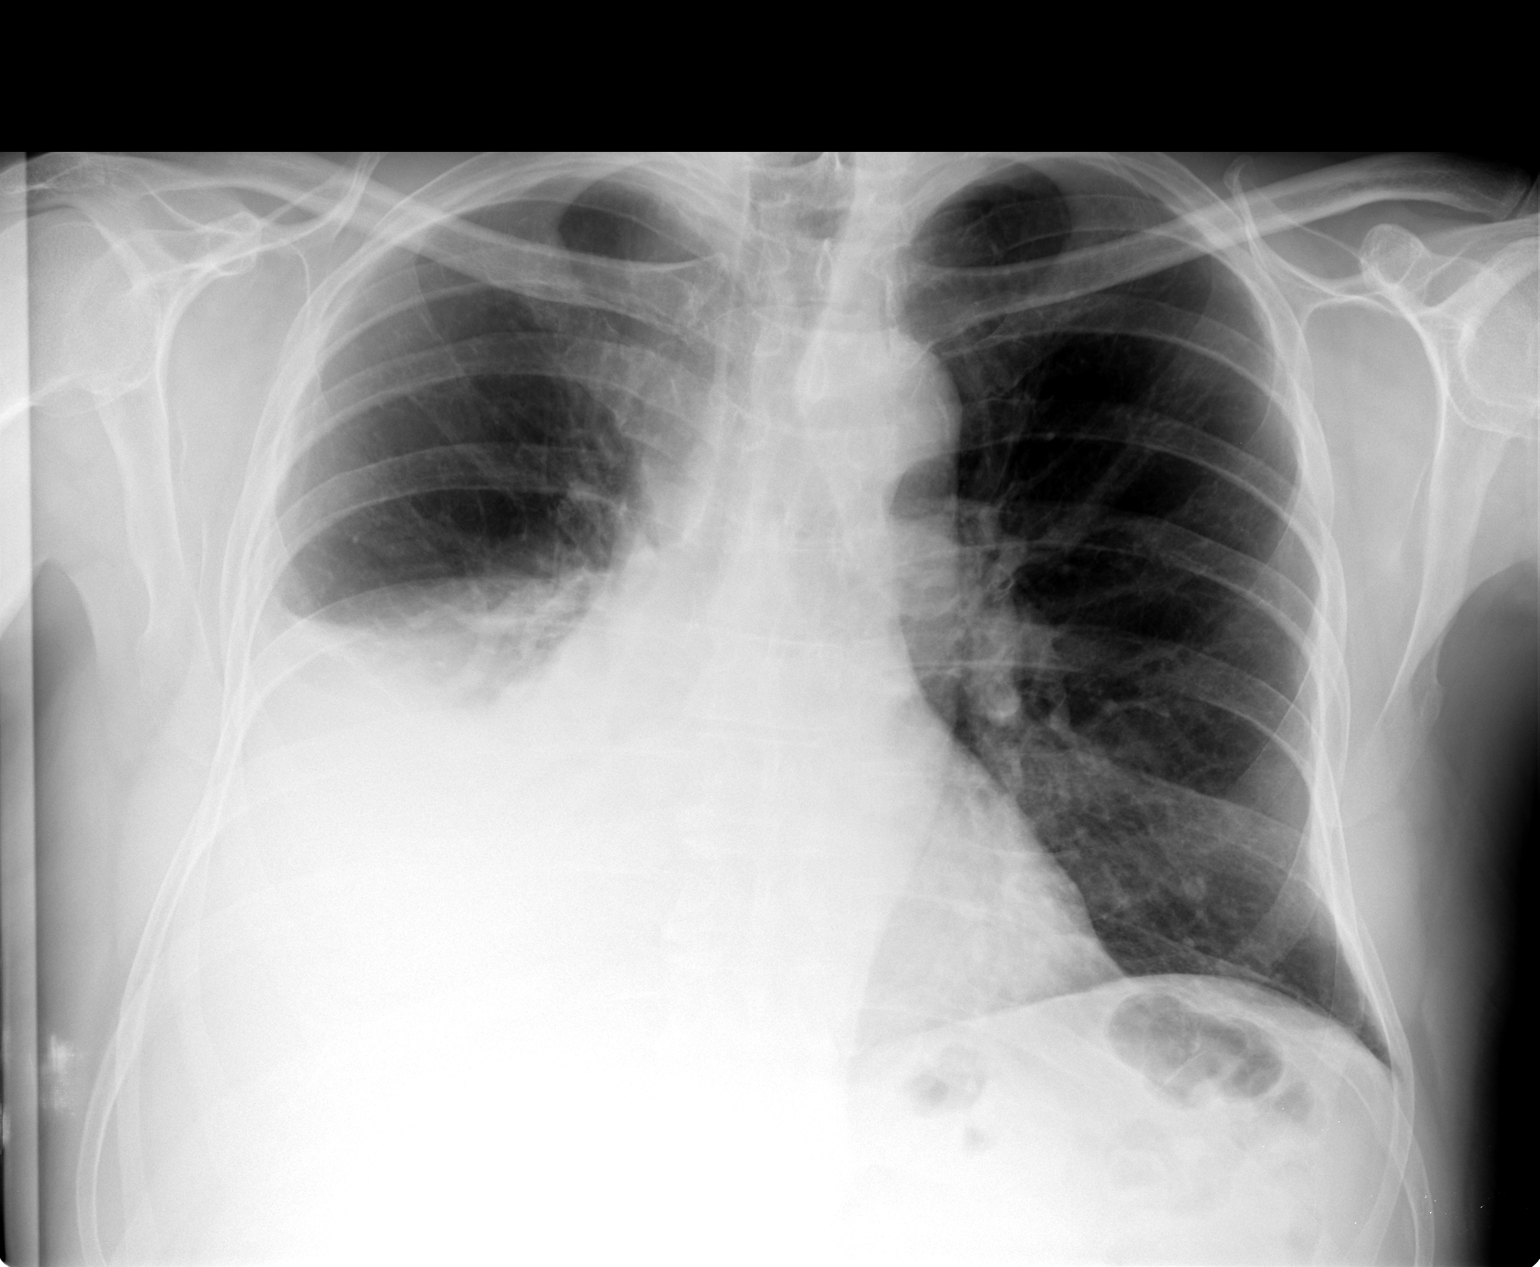

[view not recorded (2 of 2)]
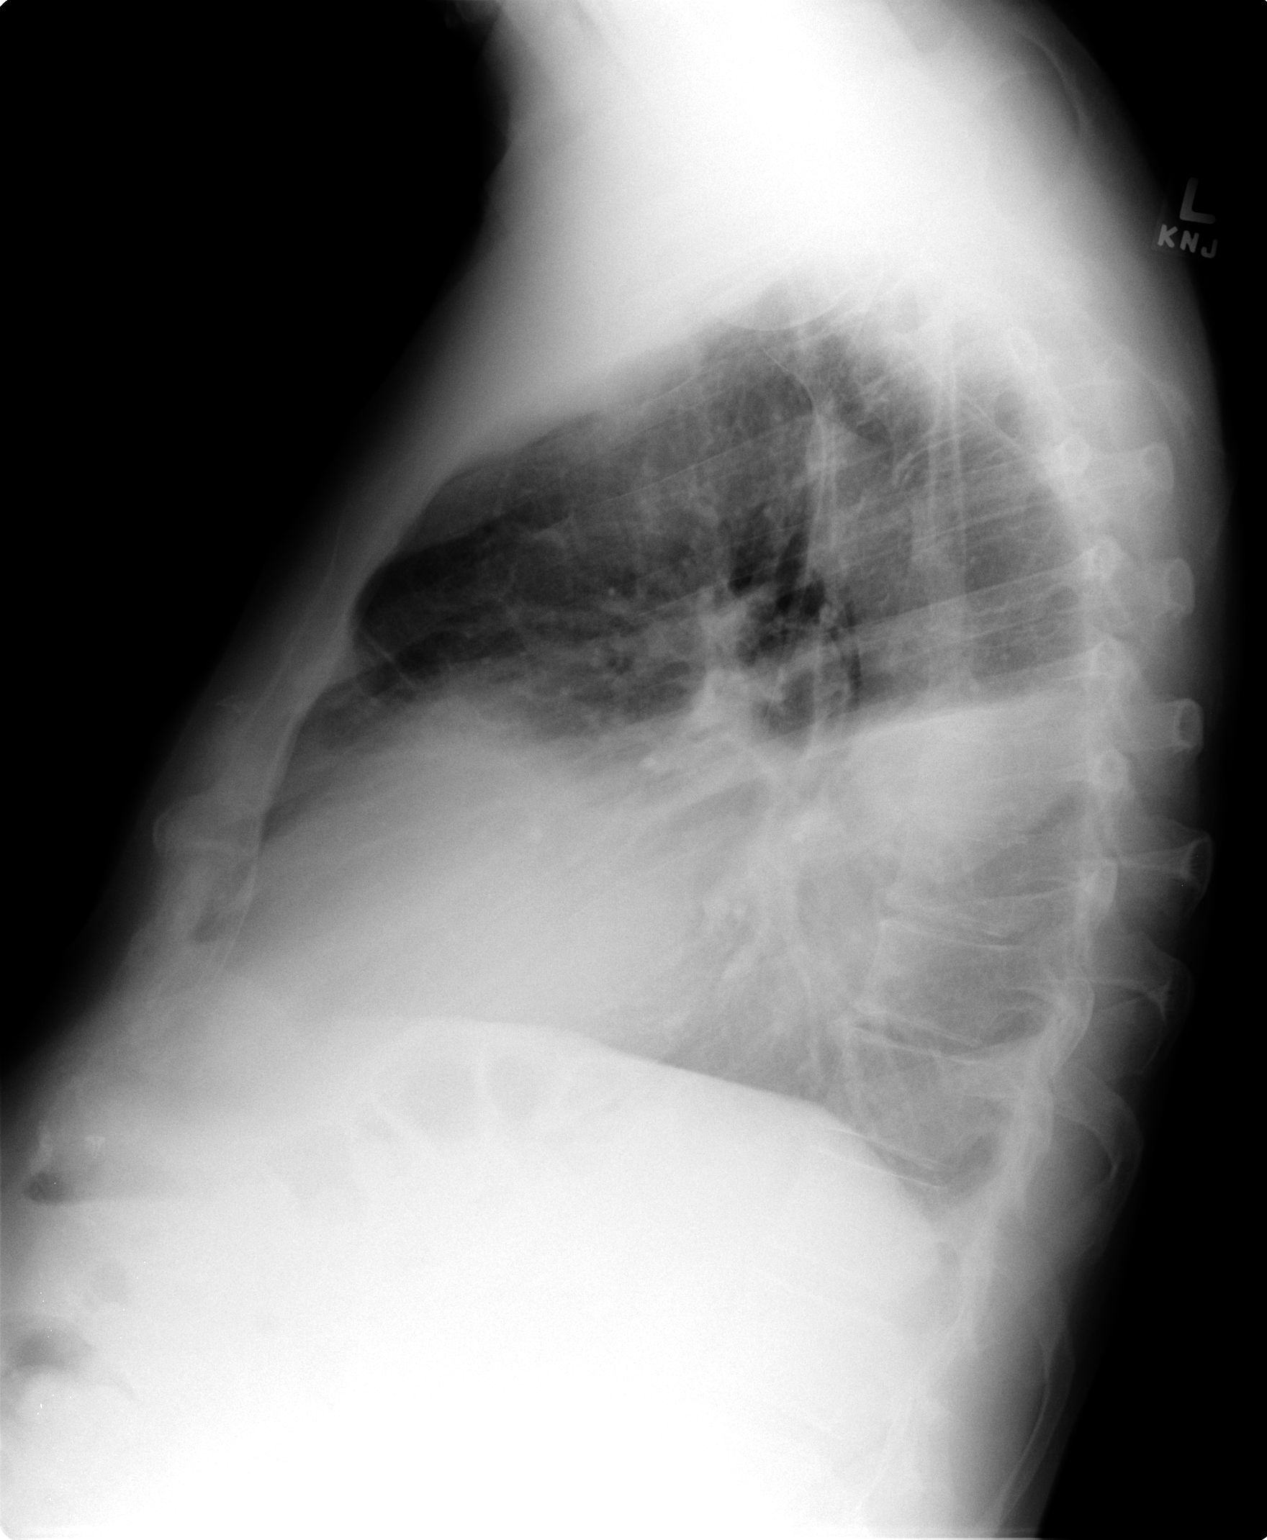

[2 of 2 positions shown; findings below may reference images not displayed]

FINDINGS: Mediastinum and hilar structures normal. Right lower lobe infiltrate
with a right-sided pleural effusion is present. Mild left base
infiltrate cannot be excluded. No pneumothorax. Cardiomegaly.
Pulmonary vascularity is normal. No acute bony abnormality.
IMPRESSION: 1. Right lower lobe infiltrate consistent with pneumonia. Associated
moderate right-sided pleural effusion.
2. Mild infiltrate left lung base cannot be excluded.

## 2015-12-22 IMAGING — CT CT CHEST W/O CM
2 of 3 series · 13 of 36 positions shown, 16 images · non-contrast
Comparison: Chest radiographs obtained earlier today.

CLINICAL DATA: Shortness of breath. Clinical diagnosis of
pneumonia.

EXAM:
CT CHEST WITHOUT CONTRAST
TECHNIQUE: Multidetector CT imaging of the chest was performed following the
standard protocol without IV contrast..

[Series 201: chest without, idose (3) · axial · non-contrast · 0.80mm/px · z∈[+44,+329]mm · 10 of 67 slices shown, 13 images]
[im 5/67  mediastinal]
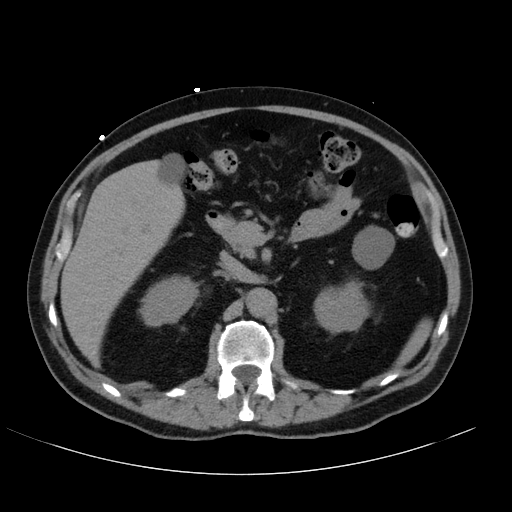
[im 5/67  lung]
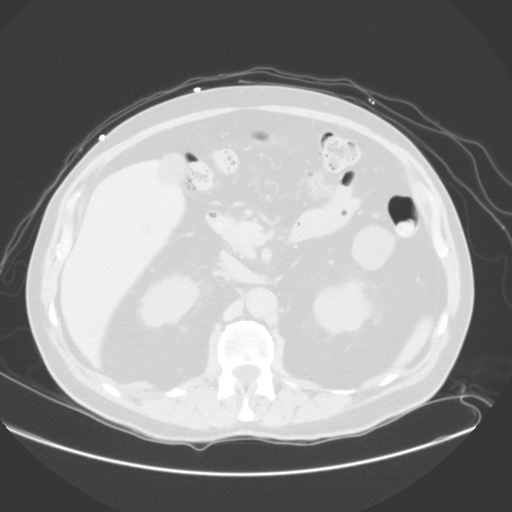
[im 10/67  lung]
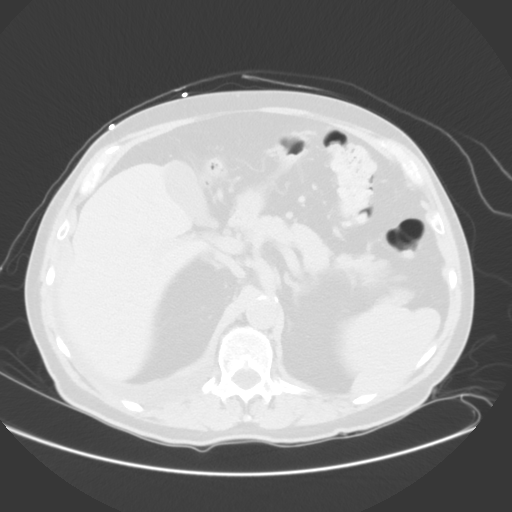
[im 18/67  lung]
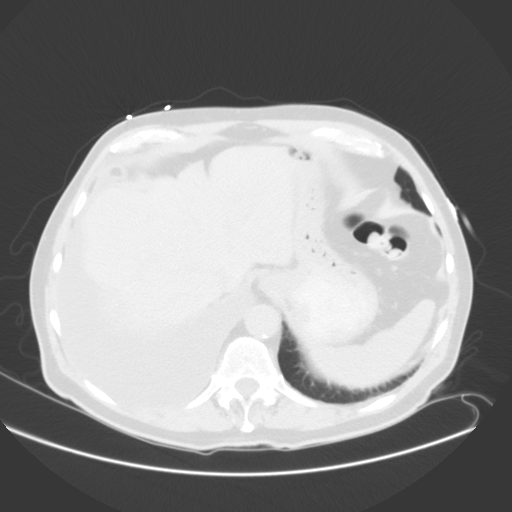
[im 25/67  lung]
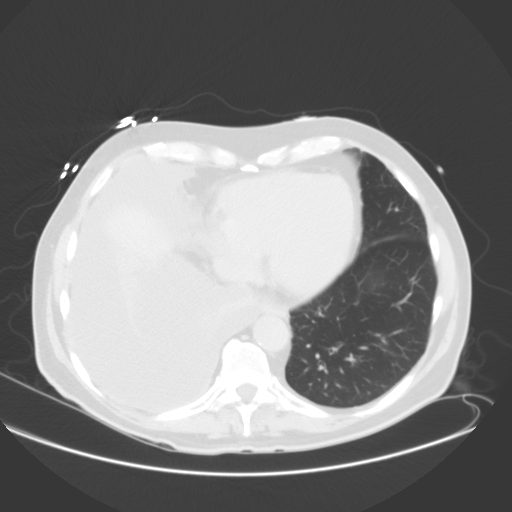
[im 30/67  mediastinal]
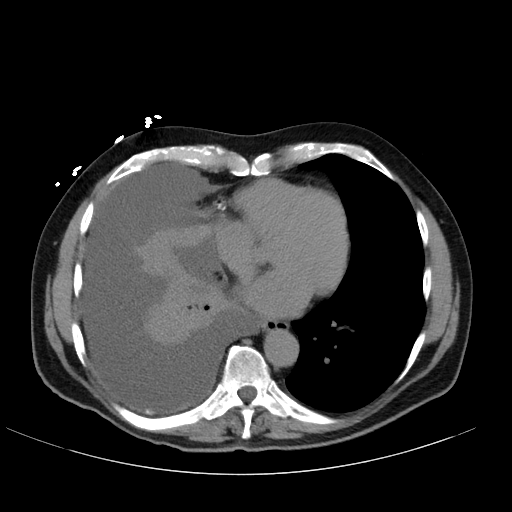
[im 30/67  lung]
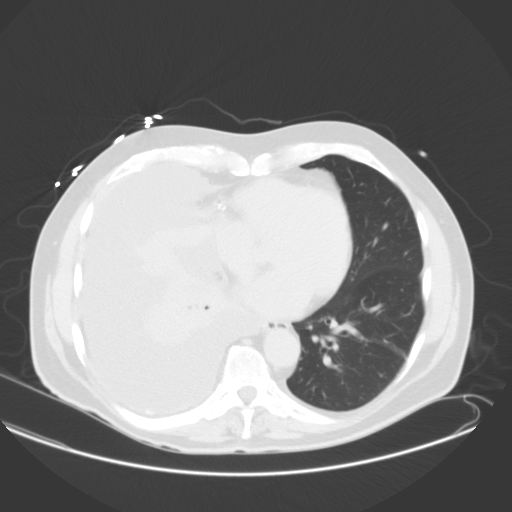
[im 37/67  lung]
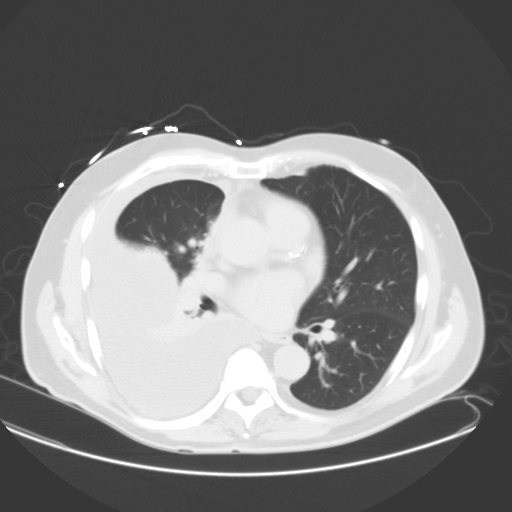
[im 42/67  lung]
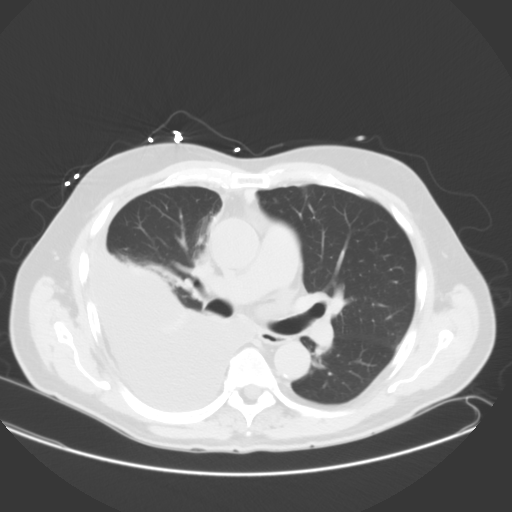
[im 49/67  lung]
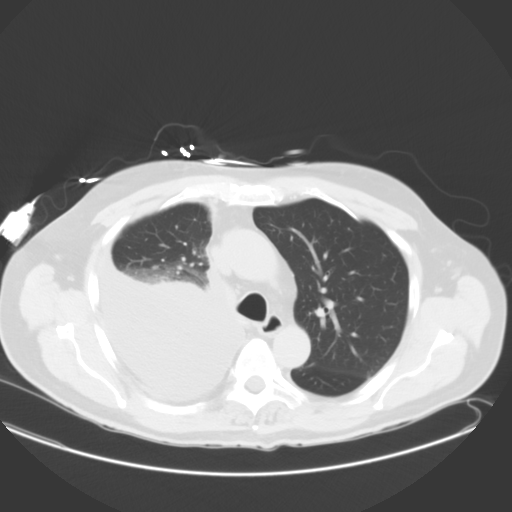
[im 57/67  mediastinal]
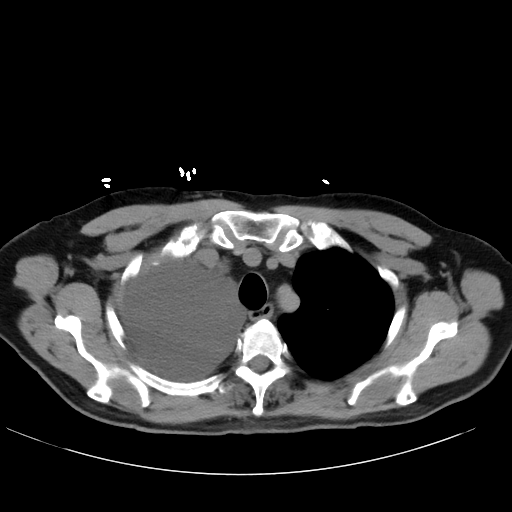
[im 57/67  lung]
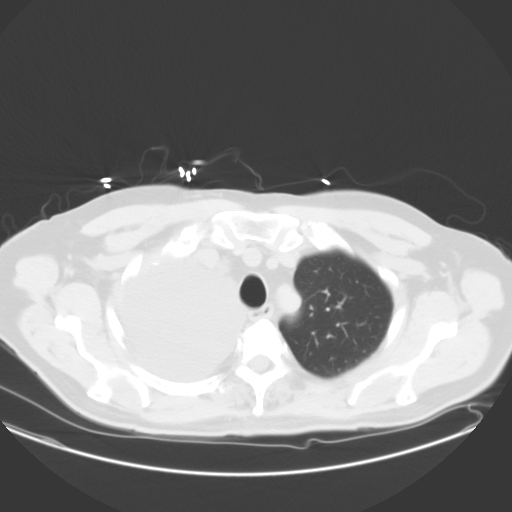
[im 62/67  lung]
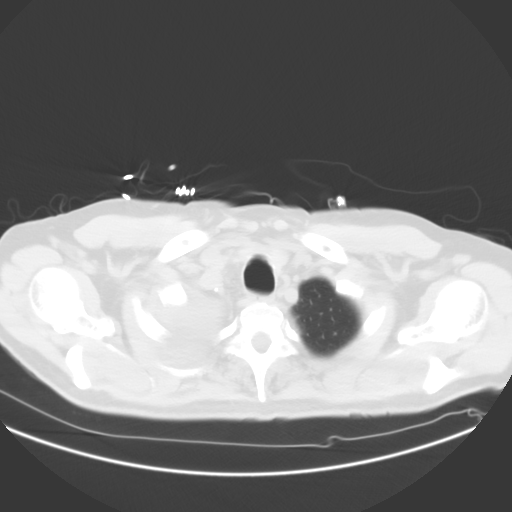

[Series 203: coronal, idose (3) · coronal · 0.50mm/px · 3 of 132 slices shown]
[im 27/132  lung]
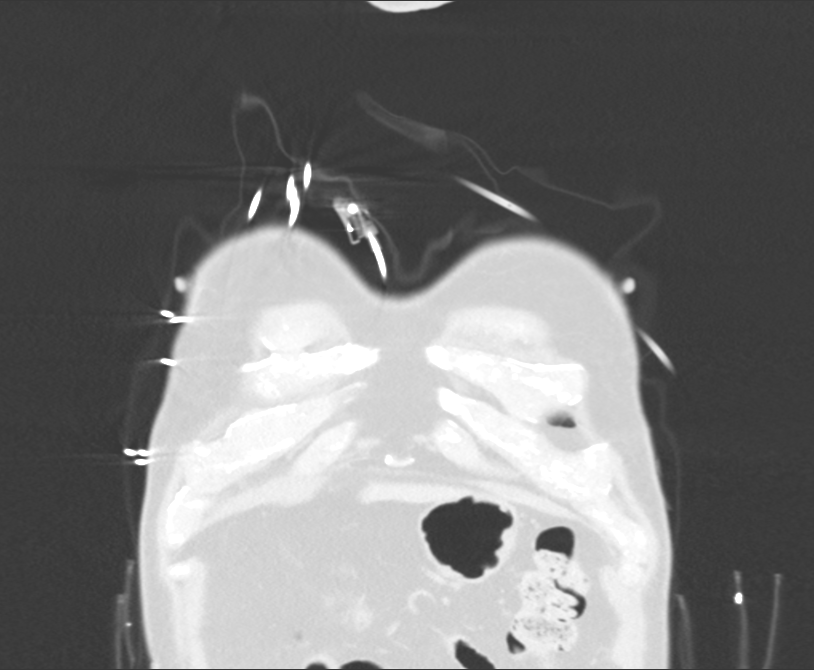
[im 53/132  lung]
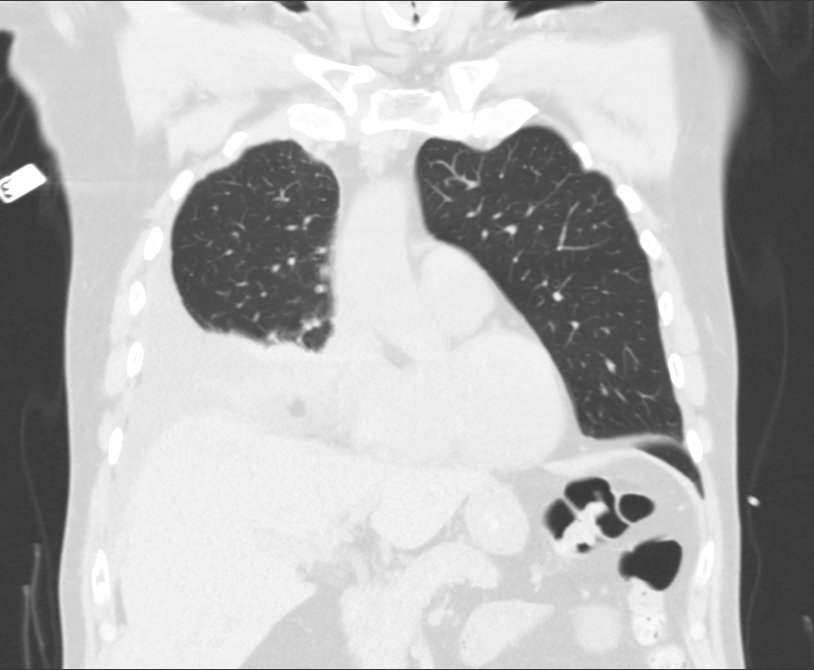
[im 79/132  lung]
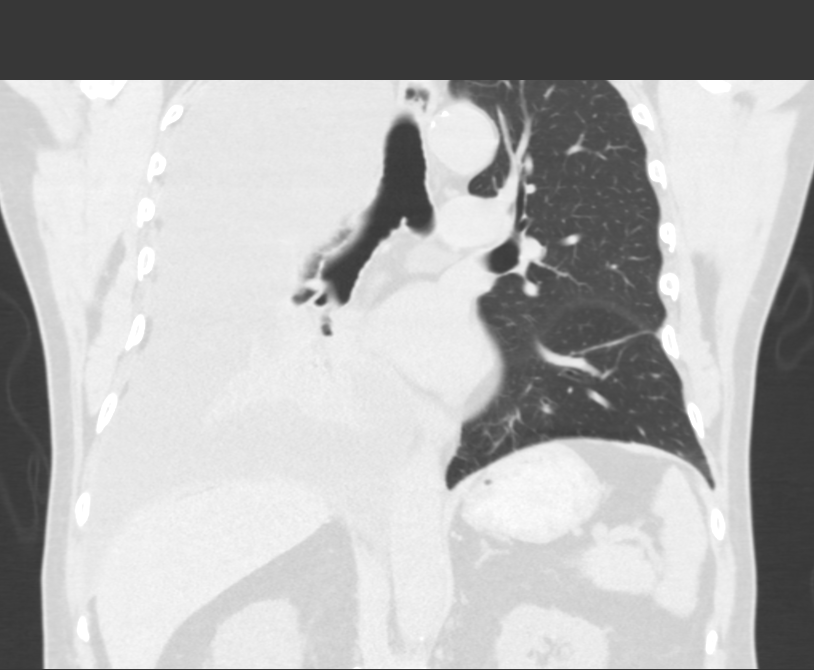

[13 of 36 positions shown; findings below may reference images not displayed]

FINDINGS: Large right pleural effusion. Compressive atelectasis of the
adjacent portions of the right lung. No visible pleural thickening
or pleural based masses. Atheromatous coronary artery
calcifications. Minimal linear atelectasis or scarring at the left
lung base. No lung nodules or enlarged lymph nodes seen.

Partially included left renal cysts. 2.1 x 1.8 cm exophytic medial
upper right renal mass measuring 38 Hounsfield units in density on
image number 65. Mild thoracic spine degenerative changes.
IMPRESSION: 1. Large right pleural effusion.
2. Compressive atelectasis of the right lung, most pronounced
involving the lower lobe.
3. Atheromatous coronary artery calcifications.
4. 2.1 cm exophytic right renal complex cyst or solid mass. If the
patient's renal function allows it, elective pre and postcontrast CT
or magnetic resonance imaging of the kidneys would be recommended.
